# Patient Record
Sex: Female | Born: 1960
Health system: Southern US, Community
[De-identification: ages and names within clinical notes are randomized; demographics above are authoritative.]

## PROBLEM LIST (undated history)

## (undated) DIAGNOSIS — R0789 Other chest pain: Secondary | ICD-10-CM

## (undated) DIAGNOSIS — K219 Gastro-esophageal reflux disease without esophagitis: Secondary | ICD-10-CM

## (undated) DIAGNOSIS — I10 Essential (primary) hypertension: Secondary | ICD-10-CM

## (undated) DIAGNOSIS — M1990 Unspecified osteoarthritis, unspecified site: Secondary | ICD-10-CM

## (undated) DIAGNOSIS — G4700 Insomnia, unspecified: Secondary | ICD-10-CM

## (undated) DIAGNOSIS — F419 Anxiety disorder, unspecified: Secondary | ICD-10-CM

## (undated) DIAGNOSIS — E669 Obesity, unspecified: Secondary | ICD-10-CM

## (undated) DIAGNOSIS — D1803 Hemangioma of intra-abdominal structures: Secondary | ICD-10-CM

## (undated) DIAGNOSIS — I499 Cardiac arrhythmia, unspecified: Secondary | ICD-10-CM

## (undated) DIAGNOSIS — J45909 Unspecified asthma, uncomplicated: Secondary | ICD-10-CM

## (undated) DIAGNOSIS — R7303 Prediabetes: Secondary | ICD-10-CM

## (undated) DIAGNOSIS — E785 Hyperlipidemia, unspecified: Secondary | ICD-10-CM

## (undated) HISTORY — DX: Hyperlipidemia, unspecified: E78.5

## (undated) HISTORY — DX: Gastro-esophageal reflux disease without esophagitis: K21.9

## (undated) HISTORY — DX: Anxiety disorder, unspecified: F41.9

## (undated) HISTORY — PX: COLONOSCOPY: SHX174

## (undated) HISTORY — DX: Obesity, unspecified: E66.9

## (undated) HISTORY — DX: Essential (primary) hypertension: I10

## (undated) HISTORY — DX: Hemangioma of intra-abdominal structures: D18.03

## (undated) HISTORY — DX: Insomnia, unspecified: G47.00

## (undated) HISTORY — PX: OTHER SURGICAL HISTORY: SHX169

## (undated) HISTORY — DX: Other chest pain: R07.89

## (undated) HISTORY — DX: Unspecified asthma, uncomplicated: J45.909

---

## 1978-01-08 HISTORY — PX: OTHER SURGICAL HISTORY: SHX169

## 1998-08-09 ENCOUNTER — Encounter: Payer: Self-pay | Admitting: Orthopedic Surgery

## 1998-08-09 ENCOUNTER — Ambulatory Visit (HOSPITAL_COMMUNITY): Admission: RE | Admit: 1998-08-09 | Discharge: 1998-08-09 | Payer: Self-pay | Admitting: Orthopedic Surgery

## 1998-08-23 ENCOUNTER — Encounter: Payer: Self-pay | Admitting: Orthopedic Surgery

## 1998-08-23 ENCOUNTER — Ambulatory Visit (HOSPITAL_COMMUNITY): Admission: RE | Admit: 1998-08-23 | Discharge: 1998-08-23 | Payer: Self-pay | Admitting: Orthopedic Surgery

## 1998-09-06 ENCOUNTER — Ambulatory Visit (HOSPITAL_COMMUNITY): Admission: RE | Admit: 1998-09-06 | Discharge: 1998-09-06 | Payer: Self-pay | Admitting: Orthopedic Surgery

## 1998-09-06 ENCOUNTER — Encounter: Payer: Self-pay | Admitting: Orthopedic Surgery

## 1998-09-10 ENCOUNTER — Emergency Department (HOSPITAL_COMMUNITY): Admission: EM | Admit: 1998-09-10 | Discharge: 1998-09-10 | Payer: Self-pay | Admitting: Emergency Medicine

## 1998-09-10 ENCOUNTER — Encounter: Payer: Self-pay | Admitting: Emergency Medicine

## 1998-11-28 ENCOUNTER — Other Ambulatory Visit: Admission: RE | Admit: 1998-11-28 | Discharge: 1998-11-28 | Payer: Self-pay | Admitting: Obstetrics and Gynecology

## 1999-02-16 ENCOUNTER — Ambulatory Visit (HOSPITAL_BASED_OUTPATIENT_CLINIC_OR_DEPARTMENT_OTHER): Admission: RE | Admit: 1999-02-16 | Discharge: 1999-02-16 | Payer: Self-pay | Admitting: Plastic Surgery

## 1999-03-30 ENCOUNTER — Encounter (INDEPENDENT_AMBULATORY_CARE_PROVIDER_SITE_OTHER): Payer: Self-pay

## 1999-03-30 ENCOUNTER — Other Ambulatory Visit: Admission: RE | Admit: 1999-03-30 | Discharge: 1999-03-30 | Payer: Self-pay | Admitting: Obstetrics and Gynecology

## 1999-11-27 ENCOUNTER — Ambulatory Visit (HOSPITAL_COMMUNITY): Admission: RE | Admit: 1999-11-27 | Discharge: 1999-11-27 | Payer: Self-pay | Admitting: Obstetrics and Gynecology

## 1999-11-27 ENCOUNTER — Encounter: Payer: Self-pay | Admitting: Obstetrics and Gynecology

## 1999-12-07 ENCOUNTER — Other Ambulatory Visit: Admission: RE | Admit: 1999-12-07 | Discharge: 1999-12-07 | Payer: Self-pay | Admitting: Obstetrics and Gynecology

## 1999-12-13 ENCOUNTER — Encounter: Admission: RE | Admit: 1999-12-13 | Discharge: 1999-12-13 | Payer: Self-pay | Admitting: Obstetrics and Gynecology

## 1999-12-13 ENCOUNTER — Encounter: Payer: Self-pay | Admitting: Obstetrics and Gynecology

## 2000-05-03 ENCOUNTER — Ambulatory Visit (HOSPITAL_COMMUNITY): Admission: RE | Admit: 2000-05-03 | Discharge: 2000-05-03 | Payer: Self-pay | Admitting: Obstetrics and Gynecology

## 2000-05-03 ENCOUNTER — Encounter: Payer: Self-pay | Admitting: Obstetrics and Gynecology

## 2000-05-29 ENCOUNTER — Encounter: Admission: RE | Admit: 2000-05-29 | Discharge: 2000-05-29 | Payer: Self-pay | Admitting: Obstetrics and Gynecology

## 2000-05-29 ENCOUNTER — Encounter: Payer: Self-pay | Admitting: Obstetrics and Gynecology

## 2001-05-29 ENCOUNTER — Other Ambulatory Visit: Admission: RE | Admit: 2001-05-29 | Discharge: 2001-05-29 | Payer: Self-pay | Admitting: Obstetrics and Gynecology

## 2001-06-04 ENCOUNTER — Encounter: Payer: Self-pay | Admitting: Obstetrics and Gynecology

## 2001-06-04 ENCOUNTER — Encounter: Admission: RE | Admit: 2001-06-04 | Discharge: 2001-06-04 | Payer: Self-pay | Admitting: Obstetrics and Gynecology

## 2002-05-05 ENCOUNTER — Inpatient Hospital Stay (HOSPITAL_COMMUNITY): Admission: AD | Admit: 2002-05-05 | Discharge: 2002-05-06 | Payer: Self-pay | Admitting: *Deleted

## 2002-05-06 ENCOUNTER — Encounter: Payer: Self-pay | Admitting: Pulmonary Disease

## 2002-05-06 ENCOUNTER — Encounter: Payer: Self-pay | Admitting: Internal Medicine

## 2002-06-30 ENCOUNTER — Ambulatory Visit (HOSPITAL_COMMUNITY): Admission: RE | Admit: 2002-06-30 | Discharge: 2002-06-30 | Payer: Self-pay | Admitting: Obstetrics and Gynecology

## 2002-06-30 ENCOUNTER — Encounter: Payer: Self-pay | Admitting: Obstetrics and Gynecology

## 2002-07-10 ENCOUNTER — Encounter: Payer: Self-pay | Admitting: Obstetrics and Gynecology

## 2002-07-10 ENCOUNTER — Encounter: Admission: RE | Admit: 2002-07-10 | Discharge: 2002-07-10 | Payer: Self-pay | Admitting: Obstetrics and Gynecology

## 2002-09-03 ENCOUNTER — Encounter: Payer: Self-pay | Admitting: Pulmonary Disease

## 2002-09-03 ENCOUNTER — Ambulatory Visit (HOSPITAL_COMMUNITY): Admission: RE | Admit: 2002-09-03 | Discharge: 2002-09-03 | Payer: Self-pay | Admitting: Adult Health

## 2002-10-02 ENCOUNTER — Other Ambulatory Visit: Admission: RE | Admit: 2002-10-02 | Discharge: 2002-10-02 | Payer: Self-pay | Admitting: Obstetrics and Gynecology

## 2002-10-09 ENCOUNTER — Ambulatory Visit (HOSPITAL_COMMUNITY): Admission: RE | Admit: 2002-10-09 | Discharge: 2002-10-09 | Payer: Self-pay | Admitting: Internal Medicine

## 2003-04-03 ENCOUNTER — Emergency Department (HOSPITAL_COMMUNITY): Admission: EM | Admit: 2003-04-03 | Discharge: 2003-04-03 | Payer: Self-pay | Admitting: Emergency Medicine

## 2003-08-12 ENCOUNTER — Encounter: Admission: RE | Admit: 2003-08-12 | Discharge: 2003-08-12 | Payer: Self-pay | Admitting: Obstetrics and Gynecology

## 2004-01-09 HISTORY — PX: ABDOMINAL HYSTERECTOMY: SHX81

## 2004-01-20 ENCOUNTER — Other Ambulatory Visit: Admission: RE | Admit: 2004-01-20 | Discharge: 2004-01-20 | Payer: Self-pay | Admitting: Obstetrics and Gynecology

## 2004-02-03 ENCOUNTER — Ambulatory Visit: Payer: Self-pay | Admitting: Pulmonary Disease

## 2004-02-28 ENCOUNTER — Ambulatory Visit: Payer: Self-pay | Admitting: Pulmonary Disease

## 2004-04-19 ENCOUNTER — Ambulatory Visit: Payer: Self-pay

## 2004-04-19 ENCOUNTER — Observation Stay (HOSPITAL_COMMUNITY): Admission: AD | Admit: 2004-04-19 | Discharge: 2004-04-20 | Payer: Self-pay | Admitting: Obstetrics and Gynecology

## 2004-05-16 ENCOUNTER — Inpatient Hospital Stay (HOSPITAL_COMMUNITY): Admission: RE | Admit: 2004-05-16 | Discharge: 2004-05-19 | Payer: Self-pay | Admitting: Obstetrics and Gynecology

## 2004-05-16 ENCOUNTER — Encounter (INDEPENDENT_AMBULATORY_CARE_PROVIDER_SITE_OTHER): Payer: Self-pay | Admitting: Specialist

## 2004-08-09 ENCOUNTER — Ambulatory Visit: Payer: Self-pay

## 2004-10-17 ENCOUNTER — Ambulatory Visit (HOSPITAL_COMMUNITY): Admission: RE | Admit: 2004-10-17 | Discharge: 2004-10-17 | Payer: Self-pay | Admitting: Obstetrics and Gynecology

## 2005-01-08 HISTORY — PX: OTHER SURGICAL HISTORY: SHX169

## 2005-02-16 ENCOUNTER — Other Ambulatory Visit: Admission: RE | Admit: 2005-02-16 | Discharge: 2005-02-16 | Payer: Self-pay | Admitting: Obstetrics and Gynecology

## 2005-04-03 ENCOUNTER — Ambulatory Visit (HOSPITAL_COMMUNITY): Admission: RE | Admit: 2005-04-03 | Discharge: 2005-04-03 | Payer: Self-pay | Admitting: Pulmonary Disease

## 2005-04-03 ENCOUNTER — Ambulatory Visit: Payer: Self-pay | Admitting: Pulmonary Disease

## 2005-04-11 ENCOUNTER — Ambulatory Visit: Payer: Self-pay | Admitting: Pulmonary Disease

## 2005-10-25 ENCOUNTER — Ambulatory Visit (HOSPITAL_COMMUNITY): Admission: RE | Admit: 2005-10-25 | Discharge: 2005-10-25 | Payer: Self-pay | Admitting: Obstetrics and Gynecology

## 2005-11-17 ENCOUNTER — Emergency Department (HOSPITAL_COMMUNITY): Admission: EM | Admit: 2005-11-17 | Discharge: 2005-11-17 | Payer: Self-pay | Admitting: Emergency Medicine

## 2005-12-27 ENCOUNTER — Encounter: Payer: Self-pay | Admitting: Vascular Surgery

## 2005-12-27 ENCOUNTER — Ambulatory Visit (HOSPITAL_COMMUNITY): Admission: RE | Admit: 2005-12-27 | Discharge: 2005-12-27 | Payer: Self-pay | Admitting: Family Medicine

## 2006-02-07 ENCOUNTER — Ambulatory Visit: Payer: Self-pay | Admitting: Pulmonary Disease

## 2006-05-17 ENCOUNTER — Ambulatory Visit: Payer: Self-pay | Admitting: Pulmonary Disease

## 2006-05-20 ENCOUNTER — Ambulatory Visit: Payer: Self-pay | Admitting: Pulmonary Disease

## 2006-05-20 LAB — CONVERTED CEMR LAB
ALT: 21 units/L (ref 0–40)
AST: 19 units/L (ref 0–37)
Albumin: 4 g/dL (ref 3.5–5.2)
Alkaline Phosphatase: 72 units/L (ref 39–117)
BUN: 13 mg/dL (ref 6–23)
Basophils Absolute: 0.1 10*3/uL (ref 0.0–0.1)
Basophils Relative: 1.4 % — ABNORMAL HIGH (ref 0.0–1.0)
Bilirubin, Direct: 0.2 mg/dL (ref 0.0–0.3)
CO2: 29 meq/L (ref 19–32)
Calcium: 9.3 mg/dL (ref 8.4–10.5)
Chloride: 106 meq/L (ref 96–112)
Cholesterol: 156 mg/dL (ref 0–200)
Creatinine, Ser: 1 mg/dL (ref 0.4–1.2)
Eosinophils Absolute: 0.1 10*3/uL (ref 0.0–0.6)
Eosinophils Relative: 2 % (ref 0.0–5.0)
GFR calc Af Amer: 76 mL/min
GFR calc non Af Amer: 63 mL/min
Glucose, Bld: 98 mg/dL (ref 70–99)
HCT: 43.2 % (ref 36.0–46.0)
HDL: 41.8 mg/dL (ref >39.0)
Hemoglobin: 13.9 g/dL (ref 12.0–15.0)
LDL Cholesterol: 97 mg/dL (ref 0–99)
Lymphocytes Relative: 27.6 % (ref 12.0–46.0)
MCHC: 32.1 g/dL (ref 30.0–36.0)
MCV: 78.3 fL (ref 78.0–100.0)
Monocytes Absolute: 0.6 10*3/uL (ref 0.2–0.7)
Monocytes Relative: 9 % (ref 3.0–11.0)
Neutro Abs: 3.9 10*3/uL (ref 1.4–7.7)
Neutrophils Relative %: 60 % (ref 43.0–77.0)
Platelets: 371 10*3/uL (ref 150–400)
Potassium: 3.9 meq/L (ref 3.5–5.1)
RBC: 5.52 M/uL — ABNORMAL HIGH (ref 3.87–5.11)
RDW: 13 % (ref 11.5–14.6)
Sodium: 141 meq/L (ref 135–145)
TSH: 1.75 microintl units/mL (ref 0.35–5.50)
Total Bilirubin: 0.9 mg/dL (ref 0.3–1.2)
Total CHOL/HDL Ratio: 3.7
Total Protein: 7.1 g/dL (ref 6.0–8.3)
Triglycerides: 86 mg/dL (ref 0–149)
VLDL: 17 mg/dL (ref 0–40)
WBC: 6.5 10*3/uL (ref 4.5–10.5)

## 2006-05-22 ENCOUNTER — Ambulatory Visit: Payer: Self-pay | Admitting: Pulmonary Disease

## 2006-09-23 ENCOUNTER — Ambulatory Visit: Payer: Self-pay | Admitting: Pulmonary Disease

## 2006-10-28 ENCOUNTER — Ambulatory Visit (HOSPITAL_COMMUNITY): Admission: RE | Admit: 2006-10-28 | Discharge: 2006-10-28 | Payer: Self-pay | Admitting: Obstetrics and Gynecology

## 2007-02-13 ENCOUNTER — Encounter: Payer: Self-pay | Admitting: Pulmonary Disease

## 2007-02-13 ENCOUNTER — Ambulatory Visit: Payer: Self-pay | Admitting: Pulmonary Disease

## 2007-02-13 DIAGNOSIS — I10 Essential (primary) hypertension: Secondary | ICD-10-CM

## 2007-04-21 ENCOUNTER — Telehealth (INDEPENDENT_AMBULATORY_CARE_PROVIDER_SITE_OTHER): Payer: Self-pay | Admitting: *Deleted

## 2007-04-22 ENCOUNTER — Ambulatory Visit: Payer: Self-pay | Admitting: Pulmonary Disease

## 2007-05-06 IMAGING — CR DG ANKLE COMPLETE 3+V*L*
3 series · 3 of 3 positions shown · non-contrast
Comparison: None.

CLINICAL DATA: Left ankle pain.
 LEFT ANKLE ? 3 VIEW:

[view not recorded (1 of 3)]
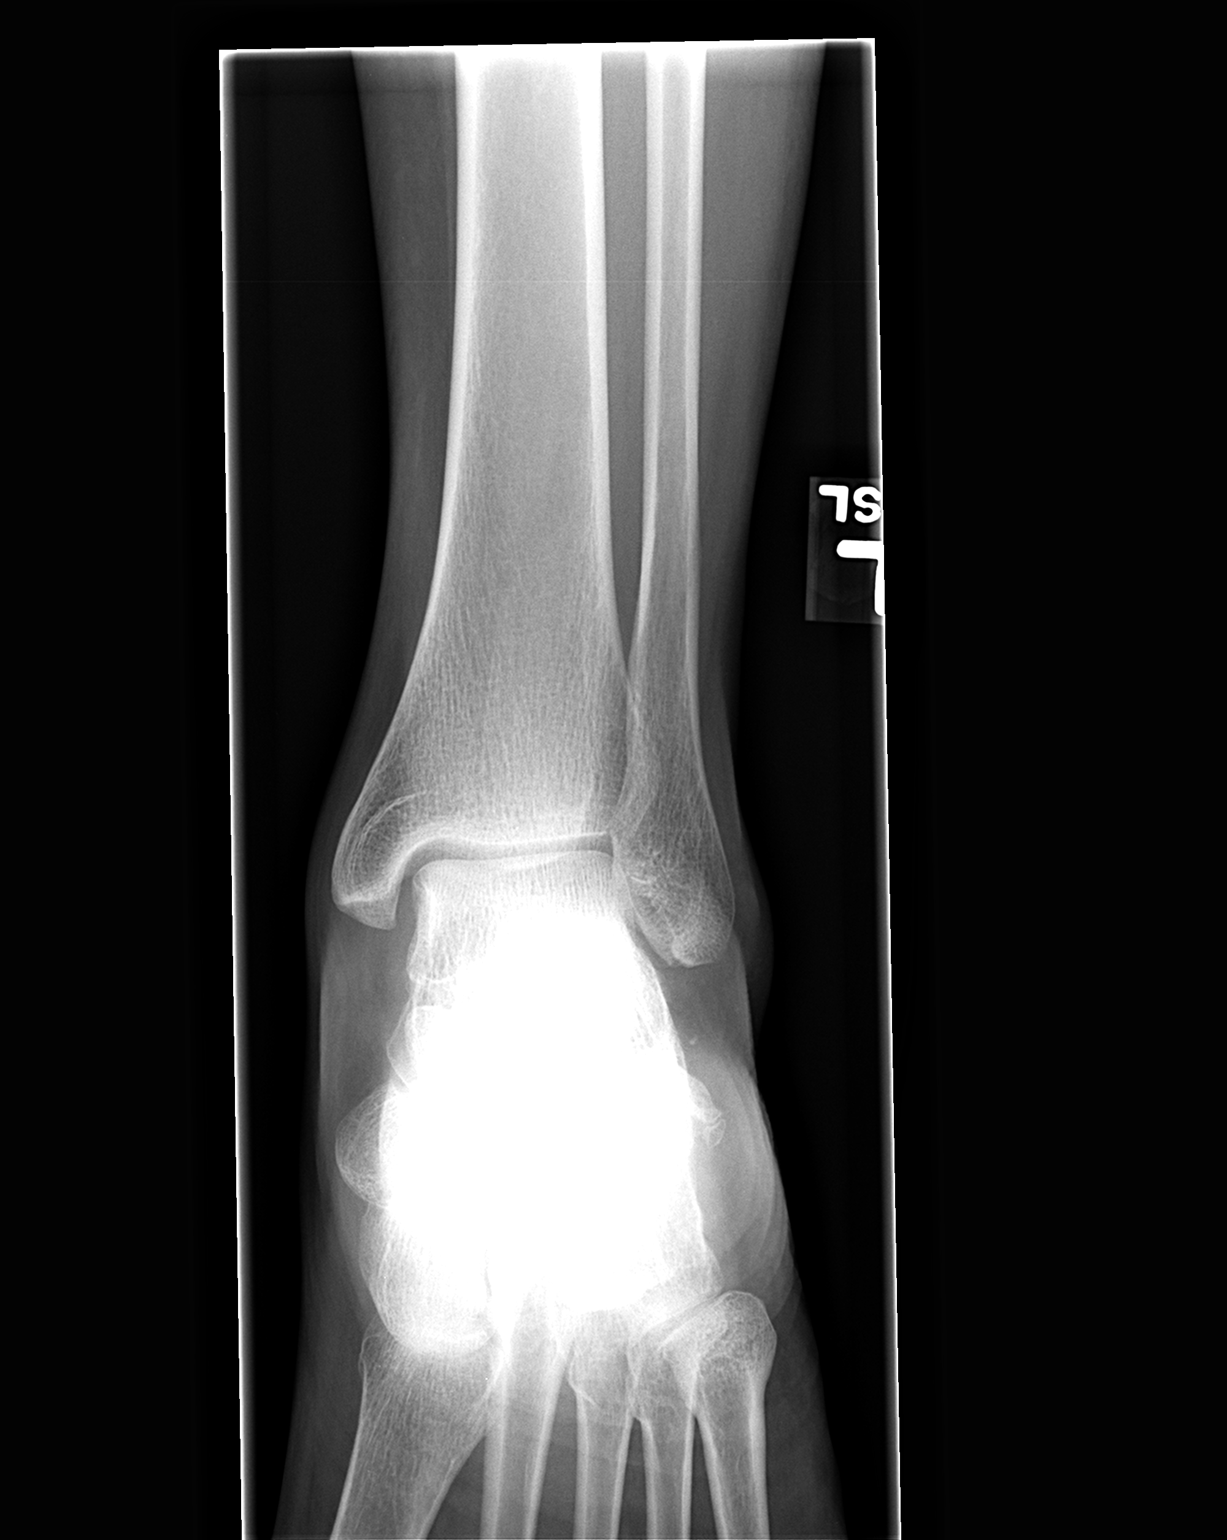

[view not recorded (2 of 3)]
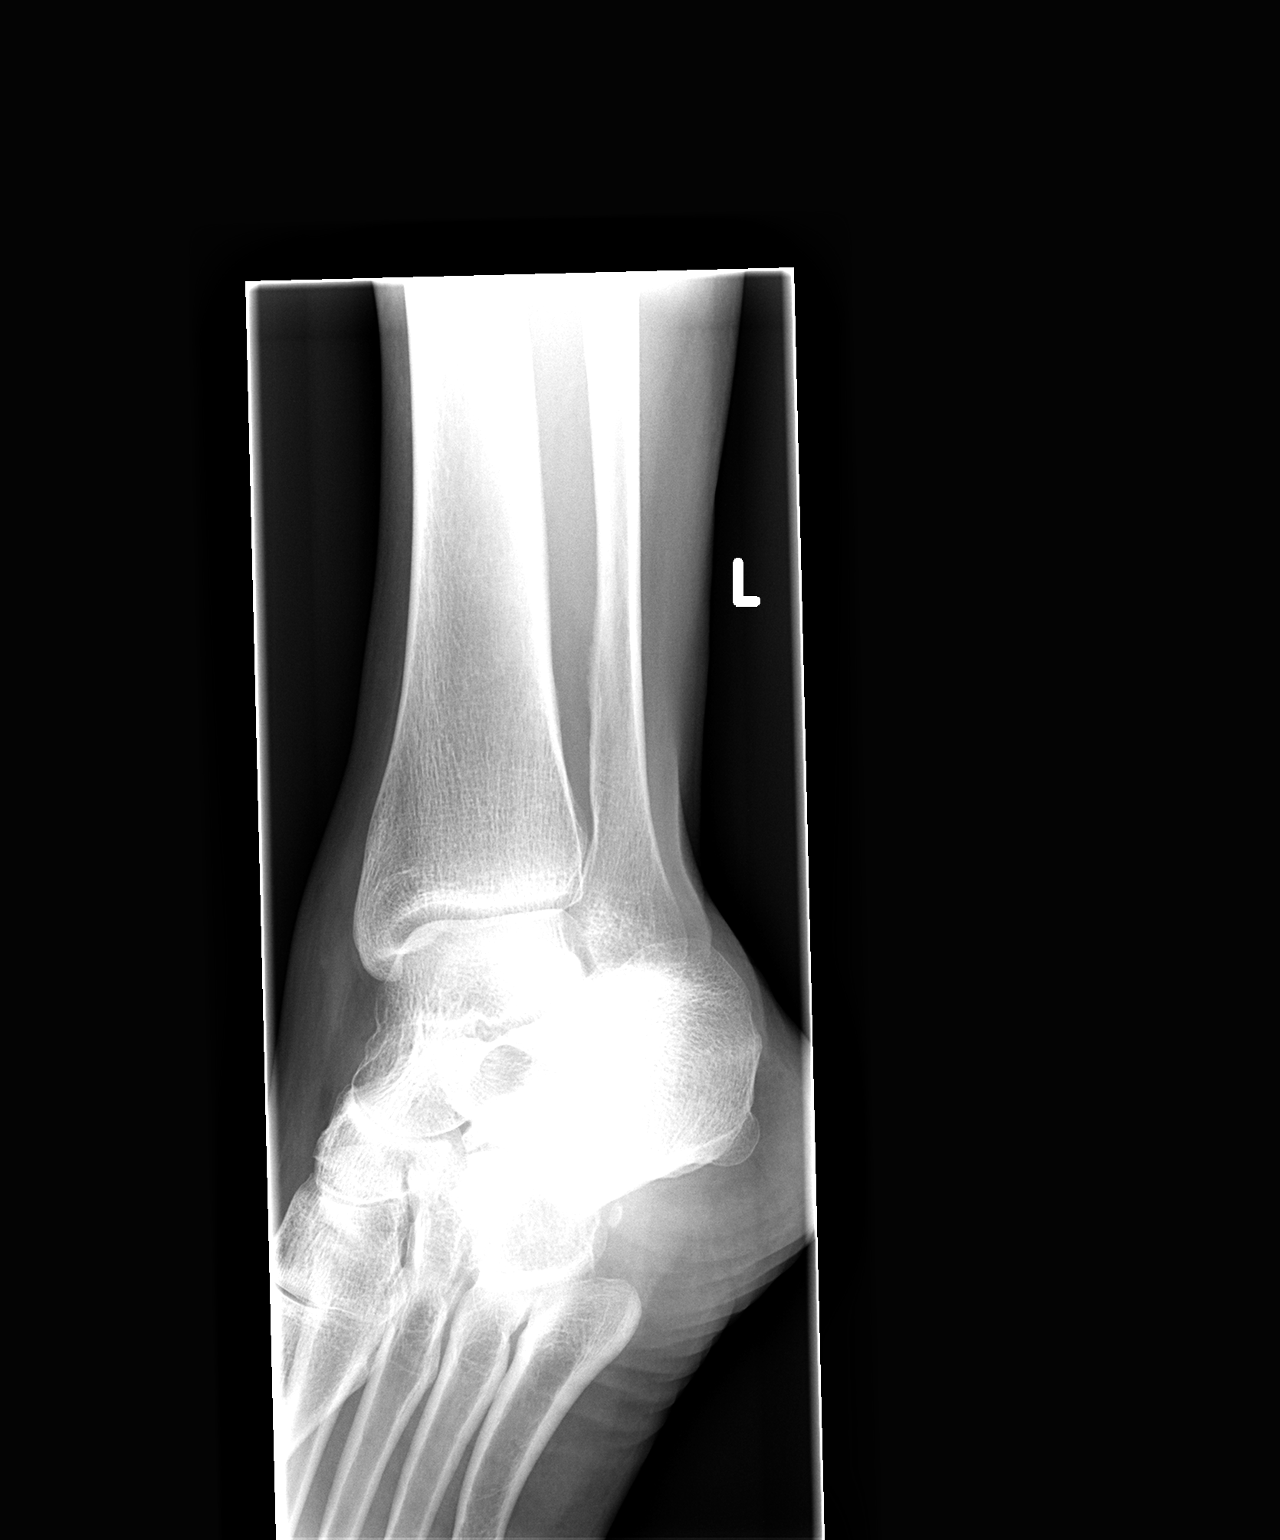

[view not recorded (3 of 3)]
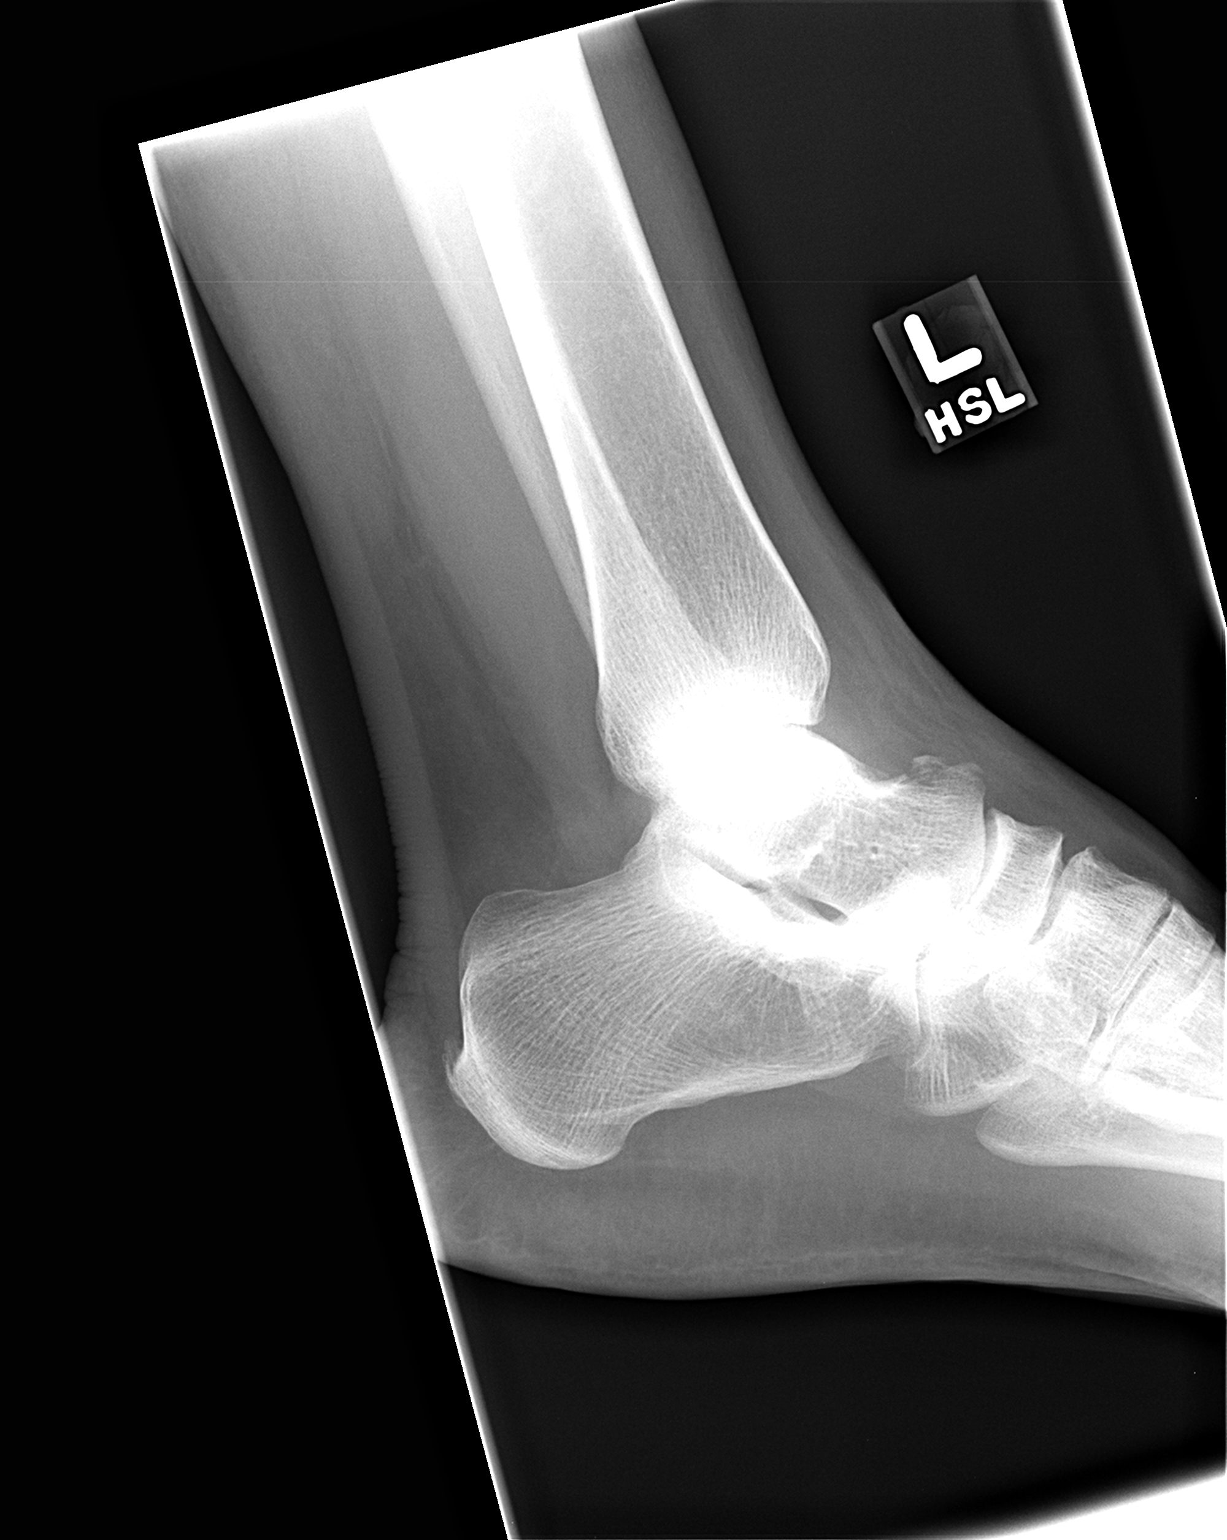

[3 of 3 positions shown; findings below may reference images not displayed]

FINDINGS: There is a fracture involving the dorsal surface of the talus.  
 No additional acute fractures or dislocations are identified.
IMPRESSION: Fracture involves the dorsal surface of the talus.

## 2007-08-27 ENCOUNTER — Encounter: Payer: Self-pay | Admitting: Pulmonary Disease

## 2007-10-06 ENCOUNTER — Encounter: Payer: Self-pay | Admitting: Pulmonary Disease

## 2007-10-27 ENCOUNTER — Encounter: Payer: Self-pay | Admitting: Pulmonary Disease

## 2007-12-03 ENCOUNTER — Ambulatory Visit: Payer: Self-pay | Admitting: Pulmonary Disease

## 2007-12-03 DIAGNOSIS — E785 Hyperlipidemia, unspecified: Secondary | ICD-10-CM

## 2007-12-03 DIAGNOSIS — K219 Gastro-esophageal reflux disease without esophagitis: Secondary | ICD-10-CM | POA: Insufficient documentation

## 2007-12-03 DIAGNOSIS — J209 Acute bronchitis, unspecified: Secondary | ICD-10-CM

## 2007-12-03 DIAGNOSIS — E669 Obesity, unspecified: Secondary | ICD-10-CM | POA: Insufficient documentation

## 2007-12-03 DIAGNOSIS — R0789 Other chest pain: Secondary | ICD-10-CM | POA: Insufficient documentation

## 2007-12-03 DIAGNOSIS — F411 Generalized anxiety disorder: Secondary | ICD-10-CM | POA: Insufficient documentation

## 2007-12-03 DIAGNOSIS — D1803 Hemangioma of intra-abdominal structures: Secondary | ICD-10-CM

## 2007-12-08 ENCOUNTER — Ambulatory Visit: Payer: Self-pay | Admitting: Pulmonary Disease

## 2007-12-08 ENCOUNTER — Ambulatory Visit: Payer: Self-pay | Admitting: Internal Medicine

## 2007-12-15 ENCOUNTER — Ambulatory Visit: Payer: Self-pay | Admitting: Internal Medicine

## 2007-12-15 ENCOUNTER — Encounter: Payer: Self-pay | Admitting: Internal Medicine

## 2007-12-15 ENCOUNTER — Ambulatory Visit (HOSPITAL_COMMUNITY): Admission: RE | Admit: 2007-12-15 | Discharge: 2007-12-15 | Payer: Self-pay | Admitting: Internal Medicine

## 2007-12-19 ENCOUNTER — Telehealth: Payer: Self-pay | Admitting: Pulmonary Disease

## 2008-01-07 ENCOUNTER — Ambulatory Visit (HOSPITAL_COMMUNITY): Admission: RE | Admit: 2008-01-07 | Discharge: 2008-01-07 | Payer: Self-pay | Admitting: Obstetrics and Gynecology

## 2008-01-17 ENCOUNTER — Encounter: Payer: Self-pay | Admitting: Pulmonary Disease

## 2008-07-07 ENCOUNTER — Telehealth (INDEPENDENT_AMBULATORY_CARE_PROVIDER_SITE_OTHER): Payer: Self-pay | Admitting: *Deleted

## 2008-07-23 ENCOUNTER — Telehealth: Payer: Self-pay | Admitting: Adult Health

## 2008-09-15 ENCOUNTER — Ambulatory Visit: Payer: Self-pay | Admitting: Internal Medicine

## 2008-09-15 ENCOUNTER — Encounter: Payer: Self-pay | Admitting: Adult Health

## 2008-09-15 ENCOUNTER — Telehealth: Payer: Self-pay | Admitting: Pulmonary Disease

## 2008-09-15 DIAGNOSIS — IMO0002 Reserved for concepts with insufficient information to code with codable children: Secondary | ICD-10-CM

## 2008-09-16 LAB — CONVERTED CEMR LAB
BUN: 13 mg/dL (ref 6–23)
Basophils Absolute: 0 10*3/uL (ref 0.0–0.1)
Basophils Relative: 0 % (ref 0.0–3.0)
CO2: 29 meq/L (ref 19–32)
Calcium: 9.4 mg/dL (ref 8.4–10.5)
Chloride: 105 meq/L (ref 96–112)
Creatinine, Ser: 0.9 mg/dL (ref 0.4–1.2)
Eosinophils Absolute: 0.2 10*3/uL (ref 0.0–0.7)
Eosinophils Relative: 1.4 % (ref 0.0–5.0)
GFR calc non Af Amer: 85.34 mL/min (ref >60)
Glucose, Bld: 91 mg/dL (ref 70–99)
HCT: 39.4 % (ref 36.0–46.0)
Hemoglobin: 13 g/dL (ref 12.0–15.0)
Hgb A1c MFr Bld: 5.9 % (ref 4.6–6.5)
Lymphocytes Relative: 14.3 % (ref 12.0–46.0)
Lymphs Abs: 1.6 10*3/uL (ref 0.7–4.0)
MCHC: 32.9 g/dL (ref 30.0–36.0)
MCV: 79.4 fL (ref 78.0–100.0)
Monocytes Absolute: 0.6 10*3/uL (ref 0.1–1.0)
Monocytes Relative: 5.4 % (ref 3.0–12.0)
Neutro Abs: 8.9 10*3/uL — ABNORMAL HIGH (ref 1.4–7.7)
Neutrophils Relative %: 78.9 % — ABNORMAL HIGH (ref 43.0–77.0)
Platelets: 326 10*3/uL (ref 150.0–400.0)
Potassium: 3.7 meq/L (ref 3.5–5.1)
RBC: 4.97 M/uL (ref 3.87–5.11)
RDW: 13.1 % (ref 11.5–14.6)
Sodium: 141 meq/L (ref 135–145)
WBC: 11.3 10*3/uL — ABNORMAL HIGH (ref 4.5–10.5)

## 2009-01-11 ENCOUNTER — Ambulatory Visit (HOSPITAL_COMMUNITY): Admission: RE | Admit: 2009-01-11 | Discharge: 2009-01-11 | Payer: Self-pay | Admitting: Obstetrics and Gynecology

## 2009-01-12 ENCOUNTER — Telehealth (INDEPENDENT_AMBULATORY_CARE_PROVIDER_SITE_OTHER): Payer: Self-pay | Admitting: *Deleted

## 2009-01-24 ENCOUNTER — Telehealth (INDEPENDENT_AMBULATORY_CARE_PROVIDER_SITE_OTHER): Payer: Self-pay | Admitting: *Deleted

## 2009-04-12 ENCOUNTER — Telehealth (INDEPENDENT_AMBULATORY_CARE_PROVIDER_SITE_OTHER): Payer: Self-pay | Admitting: *Deleted

## 2009-08-15 ENCOUNTER — Telehealth: Payer: Self-pay | Admitting: Pulmonary Disease

## 2009-08-19 ENCOUNTER — Ambulatory Visit: Payer: Self-pay | Admitting: Pulmonary Disease

## 2009-08-24 ENCOUNTER — Ambulatory Visit: Payer: Self-pay | Admitting: Pulmonary Disease

## 2009-08-28 DIAGNOSIS — G4700 Insomnia, unspecified: Secondary | ICD-10-CM

## 2009-08-28 LAB — CONVERTED CEMR LAB
ALT: 46 units/L — ABNORMAL HIGH (ref 0–35)
AST: 31 units/L (ref 0–37)
Albumin: 4.2 g/dL (ref 3.5–5.2)
Alkaline Phosphatase: 85 units/L (ref 39–117)
BUN: 16 mg/dL (ref 6–23)
Basophils Absolute: 0.1 10*3/uL (ref 0.0–0.1)
Basophils Relative: 1 % (ref 0.0–3.0)
Bilirubin Urine: NEGATIVE
Bilirubin, Direct: 0.1 mg/dL (ref 0.0–0.3)
CO2: 27 meq/L (ref 19–32)
Calcium: 9.8 mg/dL (ref 8.4–10.5)
Chloride: 109 meq/L (ref 96–112)
Cholesterol: 153 mg/dL (ref 0–200)
Creatinine, Ser: 0.7 mg/dL (ref 0.4–1.2)
Eosinophils Absolute: 0.1 10*3/uL (ref 0.0–0.7)
Eosinophils Relative: 2.1 % (ref 0.0–5.0)
GFR calc non Af Amer: 111.78 mL/min (ref >60)
Glucose, Bld: 91 mg/dL (ref 70–99)
HCT: 39.6 % (ref 36.0–46.0)
HDL: 50.3 mg/dL (ref >39.00)
Hemoglobin: 13.1 g/dL (ref 12.0–15.0)
Ketones, ur: NEGATIVE mg/dL
LDL Cholesterol: 94 mg/dL (ref 0–99)
Leukocytes, UA: NEGATIVE
Lymphocytes Relative: 34.2 % (ref 12.0–46.0)
Lymphs Abs: 1.9 10*3/uL (ref 0.7–4.0)
MCHC: 33.1 g/dL (ref 30.0–36.0)
MCV: 78.6 fL (ref 78.0–100.0)
Monocytes Absolute: 0.7 10*3/uL (ref 0.1–1.0)
Monocytes Relative: 12.6 % — ABNORMAL HIGH (ref 3.0–12.0)
Neutro Abs: 2.9 10*3/uL (ref 1.4–7.7)
Neutrophils Relative %: 50.1 % (ref 43.0–77.0)
Nitrite: NEGATIVE
Platelets: 307 10*3/uL (ref 150.0–400.0)
Potassium: 4.6 meq/L (ref 3.5–5.1)
RBC: 5.04 M/uL (ref 3.87–5.11)
RDW: 14.6 % (ref 11.5–14.6)
Sodium: 143 meq/L (ref 135–145)
Specific Gravity, Urine: 1.02 (ref 1.000–1.030)
TSH: 1.97 microintl units/mL (ref 0.35–5.50)
Total Bilirubin: 0.5 mg/dL (ref 0.3–1.2)
Total CHOL/HDL Ratio: 3
Total Protein, Urine: NEGATIVE mg/dL
Total Protein: 6.8 g/dL (ref 6.0–8.3)
Triglycerides: 44 mg/dL (ref 0.0–149.0)
Urine Glucose: NEGATIVE mg/dL
Urobilinogen, UA: 0.2 (ref 0.0–1.0)
VLDL: 8.8 mg/dL (ref 0.0–40.0)
WBC: 5.7 10*3/uL (ref 4.5–10.5)
pH: 5.5 (ref 5.0–8.0)

## 2009-09-06 ENCOUNTER — Telehealth: Payer: Self-pay | Admitting: Internal Medicine

## 2009-09-06 ENCOUNTER — Ambulatory Visit: Payer: Self-pay | Admitting: Internal Medicine

## 2009-09-07 LAB — CONVERTED CEMR LAB
ALT: 30 units/L (ref 0–35)
AST: 23 units/L (ref 0–37)
Albumin: 4.5 g/dL (ref 3.5–5.2)
Alkaline Phosphatase: 91 units/L (ref 39–117)
Amylase: 73 units/L (ref 27–131)
Bilirubin, Direct: 0.2 mg/dL (ref 0.0–0.3)
Lipase: 29 units/L (ref 11.0–59.0)
Total Bilirubin: 1.3 mg/dL — ABNORMAL HIGH (ref 0.3–1.2)
Total Protein: 7.1 g/dL (ref 6.0–8.3)

## 2009-09-08 HISTORY — PX: OTHER SURGICAL HISTORY: SHX169

## 2009-09-11 ENCOUNTER — Emergency Department (HOSPITAL_COMMUNITY): Admission: EM | Admit: 2009-09-11 | Discharge: 2009-09-11 | Payer: Self-pay | Admitting: Emergency Medicine

## 2009-09-11 ENCOUNTER — Telehealth: Payer: Self-pay | Admitting: Gastroenterology

## 2009-09-13 DIAGNOSIS — K829 Disease of gallbladder, unspecified: Secondary | ICD-10-CM | POA: Insufficient documentation

## 2009-09-14 ENCOUNTER — Encounter: Payer: Self-pay | Admitting: Internal Medicine

## 2009-09-20 ENCOUNTER — Ambulatory Visit (HOSPITAL_COMMUNITY): Admission: RE | Admit: 2009-09-20 | Discharge: 2009-09-20 | Payer: Self-pay | Admitting: General Surgery

## 2009-09-29 ENCOUNTER — Encounter: Payer: Self-pay | Admitting: Internal Medicine

## 2010-01-24 ENCOUNTER — Ambulatory Visit (HOSPITAL_COMMUNITY)
Admission: RE | Admit: 2010-01-24 | Discharge: 2010-01-24 | Payer: Self-pay | Source: Home / Self Care | Attending: Obstetrics and Gynecology | Admitting: Obstetrics and Gynecology

## 2010-01-28 ENCOUNTER — Encounter: Payer: Self-pay | Admitting: Obstetrics and Gynecology

## 2010-02-05 LAB — CONVERTED CEMR LAB
BUN: 19 mg/dL (ref 6–23)
CO2: 30 meq/L (ref 19–32)
Calcium: 9.5 mg/dL (ref 8.4–10.5)
Chloride: 103 meq/L (ref 96–112)
Creatinine, Ser: 0.8 mg/dL (ref 0.4–1.2)
GFR calc Af Amer: 98 mL/min
GFR calc non Af Amer: 81 mL/min
Glucose, Bld: 88 mg/dL (ref 70–99)
Potassium: 3.7 meq/L (ref 3.5–5.1)
Sodium: 140 meq/L (ref 135–145)
TSH: 1.44 microintl units/mL (ref 0.35–5.50)

## 2010-02-07 NOTE — Consult Note (Signed)
Summary: [facility]   Imported By: Sherian Rein 10/12/2009 15:13:46  _____________________________________________________________________  External Attachment:    Type:   Image     Comment:   External Document

## 2010-02-07 NOTE — Progress Notes (Signed)
Summary: waiting on refill  Phone Note Call from Patient   Caller: Patient Call For: nadel Summary of Call: m. cone out pt pharm told pt they had faxed for refill of atenolol. pt only has 3 remaining. needs asap. pt cell [phone] Initial call taken by: Tivis Ringer, CNA,  April 12, 2009 5:07 PM  Follow-up for Phone Call        rx sent to pharmacy.  pt aware.  Aundra Millet Reynolds LPN  April 13, 8411 5:11 PM     Prescriptions: ATENOLOL 50 MG  TABS (ATENOLOL) Take 1 tablet by mouth once a day  #90 x 3   Entered by:   Arman Filter LPN   Authorized by:   Michele Mcalpine MD   Signed by:   Arman Filter LPN on 24/40/1027   Method used:   Electronically to        [facility]* (retail)       [address]       [address] Shipping/mailing       [place], [place]  [address]       Ph: [phone]       Fax: [phone]   RxID:   [id number]

## 2010-02-07 NOTE — Progress Notes (Signed)
Summary: dyspepsia  Medications Added DEXILANT 60 MG CPDR (DEXLANSOPRAZOLE) 1 by mouth q AM 30 mins before breakfast       Phone Note Other Incoming   Summary of Call: Having nausea and heartburn. Tried Prilosec OTC once daily two times a day. Stays nauseaous Denies stressors. had some diarrhea over the wekend then became very nauseous after 2 bites of food no discrete pain but more like a belly ache abd is non-tender no fever to take Dexilant 60 mg once daily [id number] E exp 1/12 3 bottles also check lft's amylase and lipase Initial call taken by: Iva Boop MD, Clementeen Graham,  September 06, 2009 1:20 PM  New Problems: DYSPEPSIA (ICD-536.8)   New Problems: DYSPEPSIA (ICD-536.8) New/Updated Medications: DEXILANT 60 MG CPDR (DEXLANSOPRAZOLE) 1 by mouth q AM 30 mins before breakfast Prescriptions: DEXILANT 60 MG CPDR (DEXLANSOPRAZOLE) 1 by mouth q AM 30 mins before breakfast  #15 x 0   Entered and Authorized by:   Iva Boop MD, [facility]   Signed by:   Iva Boop MD, FACG on 09/06/2009   Method used:   Electronically to        [facility]  [address] [id number] (retail)       [address]       [place], [place]  [address]       Ph: [phone] or [phone]       Fax: [phone]   RxID:   [id number]

## 2010-02-07 NOTE — Progress Notes (Signed)
Summary: cough/ congestion  Phone Note Call from Patient   Caller: Patient Call For: nadel Summary of Call: pt have yellow congestion with cough would like something called to pharmacy [facility] [address] Initial call taken by: Rickard Patience,  January 24, 2009 8:11 AM  Follow-up for Phone Call        Spoke with pt, she c/o prod cough with yellow sputum and nasal congestion x 3 days.  States that she has taking otc cold/sinus med with no relief.  Would like rx called in.  Please advise thanks! NKDA Vernie Murders  January 24, 2009 8:23 AM   Additional Follow-up for Phone Call Additional follow up Details #1::        per SN---ok for pt to have zpak  #1  take as directed and mucinex max  1200mg   1 by mouth two times a day with plenty of fluids.  thanks Randell Loop CMA  January 24, 2009 9:55 AM     Additional Follow-up for Phone Call Additional follow up Details #2::    Spoke with pt and advised of SN's recs.  Rx for zpack was sent to pharm. Follow-up by: Vernie Murders,  January 24, 2009 10:00 AM  New/Updated Medications: ZITHROMAX Z-PAK 250 MG TABS (AZITHROMYCIN) take as directed Prescriptions: ZITHROMAX Z-PAK 250 MG TABS (AZITHROMYCIN) take as directed  #1 x 0   Entered by:   Vernie Murders   Authorized by:   Michele Mcalpine MD   Signed by:   Vernie Murders on 01/24/2009   Method used:   [facility]ronically [facility]        [facility]  [address] [id number]retail[facility]       [address]       [place], [place]  [address]       Ph: [phone] or [phone]       Fax: [phone]   RxID:   [id number]

## 2010-02-07 NOTE — Progress Notes (Signed)
Summary: labs  Phone Note Call from Patient Call back at [phone]   Caller: Patient Call For: nadel Reason for Call: Talk to Nurse Summary of Call: pt shcedulled to come in 08/24/2009.  Would like to come in sooner for her labs.  Can you put labs in and let her know.  [id number] Initial call taken by: Eugene Gavia,  August 15, 2009 11:47 AM  Follow-up for Phone Call        Pt saw TP last in Sept 2010 and was seen by SN last Nov 2009! Does she need labs done before her appt with SN this month? Pls advise thanks! Follow-up by: Vernie Murders,  August 15, 2009 11:53 AM  Additional Follow-up for Phone Call Additional follow up Details #1::        labs are in computer for 8-11---called 235 and left message for pt to make her aware Randell Loop [facility]  August 15, 2009 12:15 PM

## 2010-02-07 NOTE — Progress Notes (Signed)
Summary: Zyrtec RX  Phone Note Call from Patient Call back at ex [phone]   Caller: Patient Call For: nadel Summary of Call: would like prescript for zyrtek  [place] [facility] Initial call taken by: Rickard Patience,  January 12, 2009 8:12 AM  Follow-up for Phone Call        Patient can no longer get this OTC and needs RX sent to the pharmacy. Is this okay?Michel Bickers CMA  January 12, 2009 9:25 AM   ok for pt to have rx for zyrtec sent to the pharmacy. Randell Loop CMA  January 12, 2009 9:39 AM   Additional Follow-up for Phone Call Additional follow up Details #1::        Pt is awre of RX.Michel Bickers CMA  January 12, 2009 9:56 AM    New/Updated Medications: ZYRTEC ALLERGY 10 MG TABS (CETIRIZINE HCL) 1 by mouth daily Prescriptions: ZYRTEC ALLERGY 10 MG TABS (CETIRIZINE HCL) 1 by mouth daily  #30 x 6   Entered by:   Michel Bickers CMA   Authorized by:   Michele Mcalpine MD   Signed by:   Michel Bickers CMA on 01/12/2009   Method used:   Electronically to        [facility]* (retail)       [address]       [address] Shipping/mailing       [place], [place]  [address]       Ph: [phone]       Fax: [phone]   RxID:   [id number]

## 2010-02-07 NOTE — Letter (Signed)
Summary: [facility]   Imported By: Lester Belva 10/14/2009 09:35:19  _____________________________________________________________________  External Attachment:    Type:   Image     Comment:   External Document
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
## 2010-02-07 NOTE — Progress Notes (Signed)
   Phone Note Call from Patient   Caller: Patient Summary of Call: On call note at 0800. Pt had epigastric pain that lasted for several hours last night after eating a slice of pizza. Has ongoing, severe nausea with any food. Dexilant started several days ago which has not helped. Recent LFTs, lipase, amylase unremarkable except for t. bili=1.3. Advised to go to [facility] ED or [facility] urgent care to be evaluated and proceed with abd U/S to R/O cholelithiasis. Initial call taken by: Meryl Dare MD Clementeen Graham,  September 11, 2009 10:25 AM     Appended Document: Schedule Surgical referral Patient  was seen in the Urgent Care over teh weekend they recommended a surgical referral.  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

## 2010-03-23 LAB — DIFFERENTIAL
Basophils Absolute: 0 10*3/uL (ref 0.0–0.1)
Basophils Absolute: 0 10*3/uL (ref 0.0–0.1)
Eosinophils Absolute: 0.1 10*3/uL (ref 0.0–0.7)
Eosinophils Relative: 1 % (ref 0–5)
Lymphocytes Relative: 37 % (ref 12–46)
Monocytes Absolute: 0.5 10*3/uL (ref 0.1–1.0)
Monocytes Absolute: 0.7 10*3/uL (ref 0.1–1.0)
Neutro Abs: 2.9 10*3/uL (ref 1.7–7.7)
Neutrophils Relative %: 53 % (ref 43–77)

## 2010-03-23 LAB — CBC
HCT: 39.8 % (ref 36.0–46.0)
Hemoglobin: 13.2 g/dL (ref 12.0–15.0)
Hemoglobin: 14.8 g/dL (ref 12.0–15.0)
MCH: 26.2 pg (ref 26.0–34.0)
MCH: 25.8 pg — ABNORMAL LOW (ref 26.0–34.0)
MCHC: 33.2 g/dL (ref 30.0–36.0)
MCV: 79 fL (ref 78.0–100.0)
Platelets: 332 10*3/uL (ref 150–400)
Platelets: 389 10*3/uL (ref 150–400)
RBC: 5.04 MIL/uL (ref 3.87–5.11)
RBC: 5.73 MIL/uL — ABNORMAL HIGH (ref 3.87–5.11)
RDW: 13.9 % (ref 11.5–15.5)
WBC: 5.5 10*3/uL (ref 4.0–10.5)
WBC: 6.2 10*3/uL (ref 4.0–10.5)

## 2010-03-23 LAB — COMPREHENSIVE METABOLIC PANEL
ALT: 29 U/L (ref 0–35)
AST: 27 U/L (ref 0–37)
Albumin: 4.4 g/dL (ref 3.5–5.2)
Albumin: 4.5 g/dL (ref 3.5–5.2)
BUN: 13 mg/dL (ref 6–23)
CO2: 26 mEq/L (ref 19–32)
Chloride: 104 mEq/L (ref 96–112)
Chloride: 107 mEq/L (ref 96–112)
Creatinine, Ser: 1.01 mg/dL (ref 0.4–1.2)
GFR calc Af Amer: >60 mL/min (ref >60)
GFR calc non Af Amer: >60 mL/min (ref >60)
Potassium: 3.9 mEq/L (ref 3.5–5.1)
Sodium: 141 mEq/L (ref 135–145)
Total Bilirubin: 0.8 mg/dL (ref 0.3–1.2)
Total Bilirubin: 1.1 mg/dL (ref 0.3–1.2)
Total Protein: 7.2 g/dL (ref 6.0–8.3)

## 2010-03-23 LAB — LIPASE, BLOOD: Lipase: 32 U/L (ref 11–59)

## 2010-03-23 LAB — URINALYSIS, ROUTINE W REFLEX MICROSCOPIC
Bilirubin Urine: NEGATIVE
Nitrite: NEGATIVE
Specific Gravity, Urine: 1.01 (ref 1.005–1.030)
pH: 6.5 (ref 5.0–8.0)

## 2010-03-23 LAB — SURGICAL PCR SCREEN: Staphylococcus aureus: POSITIVE — AB

## 2010-04-10 ENCOUNTER — Telehealth: Payer: Self-pay | Admitting: Pulmonary Disease

## 2010-04-10 MED ORDER — ATENOLOL 50 MG PO TABS: ORAL_TABLET | ORAL | Status: DC

## 2010-04-10 NOTE — Telephone Encounter (Signed)
Called and spoke with pt and advised her rx was sent to [facility] pharmacy. She verbalized understanding

## 2010-05-23 NOTE — Assessment & Plan Note (Signed)
[facility]                             PULMONARY OFFICE NOTE   NAME:[name], [name]                     MRN:          [id number]  DATE:05/17/2006                            DOB:          1960/12/12    HISTORY OF PRESENT ILLNESS:  The patient is a 50 year old, African-  American female patient of Dr. Jodelle Green who has a known history of  asthmatic bronchitis, hyperlipidemia and hypertension who presents today  for a 2-day history of intermittent mid chest pain and pressure. The  patient complains that she has had intermittent episodes in which she  feels very achy all over with discomfort along her mid to left-sided  chest. She does have some intermittent nausea. She denies any associated  shortness of breath, exertional chest pain, abdominal pain, vomiting,  overt reflux symptoms, urinary symptoms, or fever. The patient has had  some arthritic complaints lately and had a previous ankle fracture and  has been taking Aleve and glucosamine. The patient has had a previous  cardiac workup with a cardiac catheterization in April 2004 by Dr.  Juanda Chance which showed normal coronary angiography and left ventricular  wall motion. The patient also had some recurrent atypical chest pains  and had a Cardiolite in August 2006 which was completely normal, no  evidence of ischemic or infarction with an ejection fraction of 82%.   PAST MEDICAL HISTORY:  Reviewed.   PHYSICAL EXAMINATION:  GENERAL:  The patient is a very pleasant, black  female in no acute distress.  VITAL SIGNS:  She is afebrile, blood pressure 120/70. O2 saturation  is  99% on room air.  HEENT:  Unremarkable.  NECK:  Supple without cervical adenopathy. No JVD.  LUNGS:  Lung sounds are clear to auscultation bilaterally. No wheezing  or crackles noted.  CARDIAC:  S1, S2 without murmurs, rubs or gallops.  ABDOMEN:  Soft without any hepatosplenomegaly appreciated. Bowel sounds  are positive throughout  all four quadrants. No guarding or rebound  noted. Negative CVA tenderness.  EXTREMITIES:  Warm without any calf tenderness, cyanosis, clubbing or  edema. Negative Homan's sign.   EKG done shows some nonspecific ST changes and poor R wave progression  but it is suspected more from lead placement. There are no acute designs  of ischemia. This EKG was discussed and reviewed with Dr. Sherene Sires.   IMPRESSION/PLAN:  1. Atypical chest pain. This patient has had an extensive cardiac      workup with a previous negative cardiac cath and Cardiolite. The      symptoms appear to be atypical for underlying cardiac disease. I      suggest that patient stop glucosamine and Aleve. This could be a      representative of underlying reflux which the patient has had in      the past. She has been off of her Protonix for several months. The      patient is to begin Nexium 40 mg twice daily over the next 5 days      and then resume daily along with reflux preventative measures. The  patient will recheck here early next week with Dr. Kriste Basque for      followup and fasting labs. The patient is advised if symptoms do      not improve or worsen she is to contact us immediately or go to the      closest emergency department.  2. Hyperlipidemia presently on Lipitor. The patient will return next      week as scheduled for fasting lipid panel.      Rubye Oaks, NP       Lonzo Cloud. Kriste Basque, MD    TP/MedQ  DD: 05/17/2006  DT: 05/18/2006  Job #: [id number]

## 2010-05-26 NOTE — Op Note (Signed)
[place]  Patient:    [name], [name]                     MRN: [id number] Proc. Date: 02/16/99 Adm. Date:  [id number] Attending:  Chapman Moss                           Operative Report  PREOPERATIVE DIAGNOSIS:  Multiple lesions face and neck undetermined behavior.  POSTOPERATIVE DIAGNOSIS:  Multiple lesions face and neck undetermined behavior.   PROCEDURE:  Excision multiple lesions of face, brow, upper eyelid, left temple,  lower eyelid and neck.  SURGEON:  Teena Irani. Odis Luster, M.D.  ANESTHESIA:  1% Xylocaine with epinephrine plus bicarbonate.  CLINICAL NOTE:  A 50 year old woman with multiple lesions that have enlarged and changed.  Some of which have developed some dark pigmentation and she would like to have them excised.  It is felt to be medically necessary.  Ashby Dawes of procedure nd risks, including scars, possibility of further surgery discussed and she wished to proceed.  DESCRIPTION OF PROCEDURE:  The patient was taken to the operating room and placed supine.  She was prepped with Betadine and draped with sterile drapes. Satisfactory local anesthesia 1% Xylocaine with epinephrine plus bicarbonate. Elliptical excision was performed with the relaxed skin tension lines and 6-0 Prolene simple interrupted sutures for closure after irrigating the wounds. She tolerated this well.  Antibiotic ointment was applied.  DISPOSITION:  Will see her back next week for recheck and removal of the sutures. She may shower starting tomorrow. DD:  02/16/99 TD:  02/17/99 Job: [id number]

## 2010-05-26 NOTE — H&P (Signed)
[name], [name]              ACCOUNT NO.:  [id number]   MEDICAL RECORD NO.:  [id number]          PATIENT TYPE:  INP   LOCATION:  NA                            [facility]   PHYSICIAN:  Huel Cote, M.D. DATE OF BIRTH:  Jun 15, 1960   DATE OF ADMISSION:  DATE OF DISCHARGE:                                HISTORY & PHYSICAL   DATE OF SURGERY:  May 16, 2004 at 10:30 a.m.   The patient is a 50 year old G3, P2-0-1-2- who is admitted several weeks ago  for heavy vaginal bleeding and indeed was brought in by EMS.  The bleeding  was so significant at work.  She has a prior history of menorrhagia which  was treated with NovaSure ablation surgery in January of 2005 with no  bleeding for approximately one year however, this episode of bleeding began  and was excessively heavy until the patient was placed on Aygestin and this  diminished her bleeding considerably.  Given her prior endometrial ablation,  the patient now wishes to proceed with definitive surgical therapy with a  hysterectomy.  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   PAST MEDICAL HISTORY:  Includes borderline chronic hypertension and  hypercholesterolemia.   PAST OBSTETRICAL HISTORY:  She has had two vaginal deliveries and one  ectopic pregnancy.   PAST GYNECOLOGIC HISTORY:  History of CIN-2 and a LEEP procedure performed  in March of 2006.   PAST SURGICAL HISTORY:  In January of 2005, she had a ThermaChoice  endometrial ablation.  In March of 2006, she had the LEEP procedure.  She  had a tubal ligation in 1989 of a remaining left fallopian tube.  In 1987,  she underwent a laparotomy with a resection of an ectopic pregnancy with a  right salpingectomy and in 1988, she had a missed abortion with a suction  dilatation and curettage.   CURRENT MEDICATIONS:  1.  Atenolol.  2.  Hydrochlorothiazide.  3.   Lipitor.   ALLERGIES:  SHE HAS NO KNOWN DRUG ALLERGIES.   PHYSICAL EXAMINATION:  VITAL SIGNS:  Temperature is 98.5, blood pressure  129/85, pulse is 95.  GENERAL:  She has no acute distress on her physical examination.  CARDIAC:  Exam is regular rate and rhythm.  LUNGS:  Clear.  ABDOMEN:  Soft and nontender.  PELVIC:  Her pelvic exam is significant for a long vagina with poor  descensus of the uterus.  Cervix also is quite short given her previous LEEP  procedure and somewhat friable.  Uterus is upper limits normal size.  Adnexa  have no palpable masses.   The patient was counseled closely to the risks and benefits of proceeding  with surgery including bleeding, infection, possible damage to bowel and  bladder.  She understands these risks but desires to proceed with the  surgery as stated.  She understands we will likely use her previous laparotomy scar and proceed  with removal of the uterus and cervix.  She wishes to retain  her ovaries,  most likely if they are normal to avoid a quick surgical menopause.  She  will remain on her Aygestin until the time of surgery to avoid any further  heavy bleeding.      KR/MEDQ  D:  05/15/2004  T:  05/15/2004  Job:  [id number]

## 2010-05-26 NOTE — Discharge Summary (Signed)
[name], [name]              ACCOUNT NO.:  [id number]   MEDICAL RECORD NO.:  [id number]          PATIENT TYPE:  INP   LOCATION:  9309                          [facility]ILITY[facility]  [facility]   PHYSICIAN:  Huel Cote, M.D. DATE OF BIRTH:  12-01-1960   DATE OF ADMISSION:  05/16/2004  DATE OF DISCHARGE:                                 DISCHARGE SUMMARY   DISCHARGE DIAGNOSES:  1.  Abnormal uterine bleeding.  2.  Failed NovaSure ablation.  3.  History of cervical dysplasia.  4.  Status post total abdominal hysterectomy   DISCHARGE MEDICATIONS:  1.  Motrin 600 mg p.o. q.6h. p.r.n.  2.  Percocet one to two tablets p.o. q.4h. p.r.n.  3.  Other medicines as taken preoperatively.   DISCHARGE FOLLOWUP:  The patient is to follow up in 2 weeks for incision  check, and again in 6 weeks for her full postoperative exam.   HOSPITAL COURSE:  The patient is a 50 year old G3, P2 who was admitted for a  scheduled abdominal hysterectomy given an ongoing problem with abnormal  uterine bleeding which began again after a NovaSure ablation approximately 1  year ago. At this time she requested definitive surgical therapy and  underwent a total abdominal hysterectomy without complication. She then was  admitted for routine postoperative care and did very well. On positive  postoperative day #1 her postoperative hemoglobin was 11.5 down from 12.8.  She was urinating well and doing well. She then stayed until postoperative  day #3, at which point she had normal diet and was having no nausea or  vomiting, passing good flatus, her pain was well controlled with p.o.  medications, and she was felt stable for discharge home.      KR/MEDQ  D:  05/19/2004  T:  05/19/2004  Job:  [id number]

## 2010-05-26 NOTE — Op Note (Signed)
NAMEJANAL, HAAK              ACCOUNT NO.:  [id number]   MEDICAL RECORD NO.:  [id number]          PATIENT TYPE:  INP   LOCATION:  9399                          [facility]  [facility]   PHYSICIAN:  Huel Cote, M.D. DATE OF BIRTH:  January 25, 1960   DATE OF PROCEDURE:  05/16/2004  DATE OF DISCHARGE:                                 OPERATIVE REPORT   PREOPERATIVE DIAGNOSES:  1.  Abnormal uterine bleeding.  2.  Failed Novasure ablation after one year.  3.  History of cervical dysplasia.   POSTOPERATIVE DIAGNOSES:  1.  Abnormal uterine bleeding.  2.  Failed Novasure ablation after one year.  3.  History of cervical dysplasia.   PROCEDURE:  Total abdominal hysterectomy.   SURGEONS:  1.  Huel Cote, M.D.  2.  Zenaida Niece, M.D.   ANESTHESIA:  General.   SPECIMENS:  Uterus and cervix.   ESTIMATED BLOOD LOSS:  100 cc.   URINE OUTPUT:  50 cc.   IV FLUIDS:  2,000 cc.   FINDINGS:  Uterus was upper limits of normal.  Adnexa were normal, with  tubes and ovaries inspected closely and found to have no abnormalities  except for some mild scarring of the right ovary to the uterine fundus.   PROCEDURE:  The patient was taken to the operating room, where general  anesthesia was obtained without difficulty.  She was then prepped and draped  in normal sterile fashion in the dorsal supine position.  With the Foley  catheter in place, a Pfannenstiel incision was made through a preexisting  scar and carried through the underlying layer of fascia by sharp dissection  and Bovie cautery.  The fascia was then nicked in the midline, and the  incision was extended laterally with Mayo scissors.  The inferior aspect of  the fascia was then grasped with Kocher clamps, elevated, and dissected off  the underlying rectus muscles.  In a similar fashion, the superior aspect  was elevated off the rectus muscles.  The rectus muscles were then separated  in the midline and the peritoneum entered  bluntly.  The peritoneal incision  was then extended carefully both by sharp dissection and Bovie cautery, with  careful attention to avoid both bowel and bladder.  The wound self-retaining  retractor was then placed within the incision and good exposure noted.  The  bowel was packed away with moist laparotomy sponges.  The uterus was then  grasped at each cornua with Kelly clamps and elevated to the level of the  incision.  Other than some adhesions of the right ovary to the uterine  fundus, there was no significant scarring or pathology noted.  The right and  left ovary appeared normal in caliber and size.  Attention was then turned  to the patient's round ligament on the left which was grasped with a mission  pickup and taken down with Bovie cautery.  The utero-ovarian ligament was  then isolated with a small hole made in the broad ligament, and this was  clamped with a parametrial clamp.  The utero-ovarian ligament was then  transected, and the pedicle  was secured with both a free tie and suture  ligature of 0 Vicryl.  The remaining broad ligament was then skeletonized  over the underlying uterine artery.  Attention was then turned to the  patient's right, where in similar fashion the round ligament was taken down  with Bovie cautery, a small hole made in the broad ligament, and then the  right ovary removed from the adhesions on the uterine fundus, both with  sharp dissection and Bovie cautery.  With the ovary successfully removed,  the utero-ovarian ligament was then clamped with a parametrial clamp,  transected, and the free pedicle secured with both a free tie and suture  ligature of 0 Vicryl.  The uterine artery was then also skeletonized, and  the bladder flap was created with Metzenbaum scissors and pushed away from  the cervix.  Two parametrial clamps were then placed on the uterine arteries  bilaterally, and these were transected and suture ligated with 0 Vicryl.  The bladder  flap was then further advanced, and the remaining paracervical  ligament was taken down with both straight and slightly curved parametrial  clamps.  Each step was secured with suture ligature of 0 Vicryl.  Upon  reaching the vaginal cuff, this was cross-clamped with slightly curved  Zeppelin clamps, and the cervix amputated in its entirety.  The vaginal cuff  was then secured with 0 Vicryl in several figure-of-eight sutures.  The  uterosacral ligaments were reapproximated to one another with a tie of two  previously held sutures.  Good support was noted.  The ureters were palpated  bilaterally and found to be normal in caliber and well away from the  surgical field.  The pelvis was then irrigated and no active bleeding noted.  Any small areas of oozing were easily controlled with Bovie cautery.  The  cuff appeared hemostatic.  Therefore, at this point, all laparotomy sponges  were removed from the abdomen, and the wound retractor also removed from the  abdomen.  The subfascial planes of the rectus muscle were checked for  bleeding and appeared to be hemostatic.  Therefore, the fascia was closed  with 0 Vicryl in a running fashion.  The subcutaneous tissue was  reapproximated with 0 chromic in a running fashion, and the skin was closed  with staples.  Sponge, lap, and needle counts were correct x2, and the  patient was taken to the recovery room in stable condition.      KR/MEDQ  D:  05/16/2004  T:  05/16/2004  Job:  [id number]

## 2010-05-26 NOTE — Cardiovascular Report (Signed)
NAME:  [name], [name]                        ACCOUNT NO.:  [id number]   MEDICAL RECORD NO.:  [id number]                   PATIENT TYPE:  INP   LOCATION:  6531                                 FACILITY:  [facility]   PHYSICIAN:  Charlies Constable, M.D.                  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  She has been having chest pain for several days, and it became  worse yesterday at work.  She saw Dr. Kriste Basque, and he arranged for her to come  to be admitted to [facility], and she was seen in consultation by Dr.  Berton Mount and scheduled for catheterization.  She has a strong positive  family history for vascular disease, and has a very elevated LDL.   DESCRIPTION OF PROCEDURE:  The procedure was performed via the right femoral  artery using an arterial sheath and 6-French preformed coronary catheters.  A front wall arterial puncture was performed, and Omnipaque contrast was  used.  A distal aortogram was performed to rule out renovascular causes for  hypertension.  The patient tolerated the procedure well and left the  laboratory in satisfactory condition.   PRESSURES:  1. The aortic pressure was 122/82 with a mean of 101.  2. Left ventricular pressure was 122/15.   RESULTS:  1. Left main:  The left main coronary artery was free of significant     disease.  2. Left anterior descending artery:  The left anterior descending artery     gave rise to a large septal perforator, a diagonal branch, and 3 more     septal perforators, and 2 small diagonal branches.  These and the LAD     proper were free of significant disease.  3. Left circumflex artery:   The left circumflex artery gave rise to a     marginal branch and a posterolateral branch.  These vessels were free of     significant disease.  4. Right coronary artery:  The right coronary artery was a moderate-sized     vessel that gave rise to a right ventricular branch, a posterior     descending branch, and 2 small posterolateral branches.  These vessels     were free of significant disease.  5. Left ventriculogram:  The left ventriculogram, performed in the RAO     projection, showed good wall motion with no areas of hypokinesis.  The     estimated ejection fraction was 60%.  6. Distal aortogram:  A distal aortogram was performed which showed patent     renal  arteries and no significant aortoiliac obstruction.   CONCLUSIONS:  Normal coronary angiography and left ventricular wall motion.    RECOMMENDATIONS:  Reassurance.  In view of these findings, her symptoms are  not likely cardiac.  Will plan to give her a trial of proton pump inhibitor  and discharge her today with followup with Dr. Kriste Basque.                                               Charlies Constable, M.D.    BB/MEDQ  D:  05/06/2002  T:  05/06/2002  Job:  [id number]   cc:   Lonzo Cloud. Kriste Basque, M.D. [facility]   Duke Salvia, M.D.   Cardiac Catheterization Lab

## 2010-05-26 NOTE — Discharge Summary (Signed)
NAME:  [name], [name]                        ACCOUNT NO.:  [id number]   MEDICAL RECORD NO.:  [id number]                   PATIENT TYPE:  INP   LOCATION:  6531                                 FACILITY:  [facility]   PHYSICIAN:  Duke Salvia, M.D.               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  Her  primary care physician is Dr. Costella Hatcher at [facility].  The  patient presented to Dr. Jodelle Green office on May 05, 2002, with chest  pressure just left to the mid sternum that began on May 04, 2002, while  walking.  The pressure left after several minutes.  The patient thought  nothing about the symptoms until it returned on May 05, 2002.  She also  noted nausea that was increasing.  She took Prilosec without relief of  symptoms.  Arrangements were made to admit the patient to [facility] for further evaluation.   PAST MEDICAL HISTORY:  1. Hypertension.  2. Elevated cholesterol levels.   ALLERGIES:  No known drug allergies.   MEDICATIONS:  Lipitor 5 mg daily.   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  She is married with two children.   HOSPITAL COURSE:  As noted, this patient was admitted to [facility]  by Dr. Kriste Basque on May 05, 2002, for evaluation of chest pain.  She was seen  in consultation by Dr. Graciela Husbands on May 05, 2002, who recommended aspirin and  beta-blocker as well as low molecular weight heparin and a heart  catheterization.   The patient underwent cardiac catheterization on May 06, 2002.  This was  found to be negative for coronary artery disease.  She had normal coronary  arteries.  The left ventricle was normal with  ejection fraction of 60%.  Arrangements were to be made to send the patient home, however, Dr. Graciela Husbands  wanted to check liver function tests, an amylase level and abdominal  ultrasound prior to discharge.  The liver function tests and amylase have  come back within normal limits.  The abdominal ultrasound has yet to be  performed.  This discharge summary is being prepared in advance.  The  patient could probably be discharged once the abdominal ultrasound has been  performed.   LABORATORY DATA AND X-RAY FINDINGS:  Amylase level 91.  Liver function tests  within normal limits.  Gallbladder ultrasound is pending.  Cholesterol 168,  triglycerides 67, HDL 58, LDL 97.  TSH 3.439.  INR 1.0.  CBC revealed  hemoglobin 12.7, hematocrit 37.9, WBC 6.2, platelets 342,000.  Chemistry  profile revealed BUN 8, creatinine 0.8, potassium 3.8, sodium  140, glucose  97.  Cardiac enzymes were negative.  The patient's potassium was initially  mildly low at 3.4.  As noted, the gallbladder ultrasound is pending.   DISCHARGE MEDICATIONS:  1. Protonix 40 mg daily.  2. Lipitor 5 mg daily.  3. Tylenol as needed for pain.   ACTIVITY:  The patient was told to avoid any strenuous activity or driving  for two days.  She was told not to lift more than 10 pounds x1 week.   DIET:  She was to be on a low fat, low salt diet.   SPECIAL INSTRUCTIONS:  She was told to call the [place] office if she had  any increased pain, swelling or bleeding from her groin.  She was to call  her primary care physician for a follow-up appointment.   FOLLOW UP:  She was to follow up with [facility] on Thursday, May  13, at noon.   DISCHARGE DIAGNOSES:  1. Chest pain with negative cardiac enzymes.  2. Cardiac catheterization performed on May 06, 2002, revealing normal     coronary arteries and normal left ventricle.  3. History of elevated lipids.  4. History of hypertension.  5. Positive family history of coronary artery  disease.  6. Gallbladder ultrasound pending.   DISPOSITION:  As noted, the patient could potentially be discharged once the  abdominal ultrasound has been performed.  She is stable from a cardiac  standpoint at this time.      Delton See, P.A. [facility]                  Duke Salvia, M.D.    DR/MEDQ  D:  05/06/2002  T:  05/06/2002  Job:  [id number]   cc:   [place], M.D.

## 2010-10-03 ENCOUNTER — Telehealth: Payer: Self-pay | Admitting: Pulmonary Disease

## 2010-10-03 DIAGNOSIS — Z0000 Encounter for general adult medical examination without abnormal findings: Secondary | ICD-10-CM

## 2010-10-03 NOTE — Telephone Encounter (Signed)
Pt has appt with SN for 10/04/10 for 1 yr rov- pls advise what labs SN wants order prior to seeing her, thanks!

## 2010-10-04 ENCOUNTER — Encounter: Payer: Self-pay | Admitting: Pulmonary Disease

## 2010-10-04 ENCOUNTER — Other Ambulatory Visit (INDEPENDENT_AMBULATORY_CARE_PROVIDER_SITE_OTHER): Payer: Self-pay

## 2010-10-04 ENCOUNTER — Ambulatory Visit (INDEPENDENT_AMBULATORY_CARE_PROVIDER_SITE_OTHER): Payer: 59 | Admitting: Pulmonary Disease

## 2010-10-04 DIAGNOSIS — D126 Benign neoplasm of colon, unspecified: Secondary | ICD-10-CM

## 2010-10-04 DIAGNOSIS — F411 Generalized anxiety disorder: Secondary | ICD-10-CM

## 2010-10-04 DIAGNOSIS — Z0000 Encounter for general adult medical examination without abnormal findings: Secondary | ICD-10-CM

## 2010-10-04 DIAGNOSIS — E663 Overweight: Secondary | ICD-10-CM

## 2010-10-04 DIAGNOSIS — K219 Gastro-esophageal reflux disease without esophagitis: Secondary | ICD-10-CM

## 2010-10-04 DIAGNOSIS — E669 Obesity, unspecified: Secondary | ICD-10-CM | POA: Insufficient documentation

## 2010-10-04 DIAGNOSIS — Z23 Encounter for immunization: Secondary | ICD-10-CM

## 2010-10-04 DIAGNOSIS — K829 Disease of gallbladder, unspecified: Secondary | ICD-10-CM

## 2010-10-04 DIAGNOSIS — I10 Essential (primary) hypertension: Secondary | ICD-10-CM

## 2010-10-04 DIAGNOSIS — E7800 Pure hypercholesterolemia, unspecified: Secondary | ICD-10-CM

## 2010-10-04 DIAGNOSIS — K635 Polyp of colon: Secondary | ICD-10-CM | POA: Insufficient documentation

## 2010-10-04 LAB — CBC WITH DIFFERENTIAL/PLATELET
Basophils Absolute: 0 10*3/uL (ref 0.0–0.1)
Eosinophils Absolute: 0.1 10*3/uL (ref 0.0–0.7)
HCT: 42.4 % (ref 36.0–46.0)
Hemoglobin: 13.9 g/dL (ref 12.0–15.0)
Lymphs Abs: 1.6 10*3/uL (ref 0.7–4.0)
MCHC: 32.8 g/dL (ref 30.0–36.0)
Monocytes Absolute: 0.6 10*3/uL (ref 0.1–1.0)
Neutro Abs: 3.2 10*3/uL (ref 1.4–7.7)
Platelets: 322 10*3/uL (ref 150.0–400.0)
RDW: 15 % — ABNORMAL HIGH (ref 11.5–14.6)

## 2010-10-04 LAB — URINALYSIS
Bilirubin Urine: NEGATIVE
Hgb urine dipstick: NEGATIVE
Ketones, ur: NEGATIVE
Leukocytes, UA: NEGATIVE
Nitrite: NEGATIVE
Specific Gravity, Urine: 1.015 (ref 1.000–1.030)
Total Protein, Urine: NEGATIVE
Urine Glucose: NEGATIVE
Urobilinogen, UA: 0.2 (ref 0.0–1.0)
pH: 6 (ref 5.0–8.0)

## 2010-10-04 LAB — LIPID PANEL
HDL: 60.2 mg/dL (ref >39.00)
Total CHOL/HDL Ratio: 2
VLDL: 7 mg/dL (ref 0.0–40.0)

## 2010-10-04 LAB — HEPATIC FUNCTION PANEL
AST: 30 U/L (ref 0–37)
Albumin: 4.3 g/dL (ref 3.5–5.2)
Alkaline Phosphatase: 88 U/L (ref 39–117)
Bilirubin, Direct: 0.1 mg/dL (ref 0.0–0.3)

## 2010-10-04 LAB — BASIC METABOLIC PANEL
CO2: 28 mEq/L (ref 19–32)
Calcium: 9.3 mg/dL (ref 8.4–10.5)
GFR: 81.49 mL/min (ref >60.00)
Glucose, Bld: 99 mg/dL (ref 70–99)
Potassium: 3.9 mEq/L (ref 3.5–5.1)
Sodium: 142 mEq/L (ref 135–145)

## 2010-10-04 LAB — TSH: TSH: 1.68 u[IU]/mL (ref 0.35–5.50)

## 2010-10-04 MED ORDER — ATENOLOL 50 MG PO TABS: ORAL_TABLET | ORAL | Status: DC

## 2010-10-04 MED ORDER — ATORVASTATIN CALCIUM 20 MG PO TABS: 20.0000 mg | ORAL_TABLET | Freq: Every day | ORAL | Status: DC

## 2010-10-04 MED ORDER — HYDROCHLOROTHIAZIDE 12.5 MG PO CAPS: 12.5000 mg | ORAL_CAPSULE | Freq: Every day | ORAL | Status: DC
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
## 2010-10-04 NOTE — Patient Instructions (Signed)
Today we updated your med list in EPIC...    We refilled your meds per request...  We reviewed your recent blood work & gave you a copy for your records...  Let's get on track w/ our diet 7 exercise program...    The goal is to lose 10-15 lbs or more...  Call for any problems.Marland KitchenMarland Kitchen

## 2010-12-18 ENCOUNTER — Telehealth: Payer: Self-pay | Admitting: Pulmonary Disease

## 2010-12-18 MED ORDER — AMOXICILLIN-POT CLAVULANATE 875-125 MG PO TABS: ORAL_TABLET | ORAL | Status: DC

## 2010-12-18 NOTE — Telephone Encounter (Signed)
RX has been sent for pt and is aware of SN recs.

## 2010-12-18 NOTE — Telephone Encounter (Signed)
I spoke with [name] and she c/o nasal congestion, cough w/ yellow phlem, facial pressure, sinus headache, and drainage down back of the throat x 2 weeks. [name] has been taking OTC mucinex and using saline nasal spray and has had no relief. [name] denies any fever, nausea, vomiting. [name] is requesting further recs from Dr. Kriste Basque. Please advise, thanks  No Known Allergies

## 2010-12-18 NOTE — Telephone Encounter (Signed)
Per SN---ok for augmentin 875mg    #20  1 po bid until gone. And use mucinex otc  2 po bid with plenty of fluids.  thanks

## 2011-01-04 ENCOUNTER — Other Ambulatory Visit (HOSPITAL_COMMUNITY): Payer: Self-pay | Admitting: Obstetrics and Gynecology

## 2011-01-04 DIAGNOSIS — Z1231 Encounter for screening mammogram for malignant neoplasm of breast: Secondary | ICD-10-CM

## 2011-02-06 ENCOUNTER — Ambulatory Visit (HOSPITAL_COMMUNITY)
Admission: RE | Admit: 2011-02-06 | Discharge: 2011-02-06 | Disposition: A | Discharge status: Home / Self Care | Payer: 59 | Source: Ambulatory Visit | Attending: Obstetrics and Gynecology | Admitting: Obstetrics and Gynecology

## 2011-02-06 DIAGNOSIS — Z1231 Encounter for screening mammogram for malignant neoplasm of breast: Secondary | ICD-10-CM | POA: Insufficient documentation

## 2011-07-26 ENCOUNTER — Other Ambulatory Visit: Payer: Self-pay

## 2011-07-26 MED ORDER — ESOMEPRAZOLE MAGNESIUM 40 MG PO CPDR: 40.0000 mg | DELAYED_RELEASE_CAPSULE | Freq: Every day | ORAL | Status: DC

## 2011-09-20 ENCOUNTER — Other Ambulatory Visit: Payer: Self-pay | Admitting: Pulmonary Disease

## 2011-10-09 ENCOUNTER — Other Ambulatory Visit: Payer: Self-pay | Admitting: Pulmonary Disease

## 2011-10-09 ENCOUNTER — Telehealth: Payer: Self-pay | Admitting: Pulmonary Disease

## 2011-10-09 DIAGNOSIS — Z0000 Encounter for general adult medical examination without abnormal findings: Secondary | ICD-10-CM

## 2011-10-09 NOTE — Telephone Encounter (Signed)
Attempted to call pt but she is at lunch.  Ok per SN to come in for lab work.  Orders have already been placed per pts request. Will try to call pt back later.

## 2011-10-09 NOTE — Telephone Encounter (Signed)
Spoke with pt. She is scheduled for cpx with SN 11/08/11 and wants to come in sooner for labs. She is having cramping in her calves and believes K may be low. Please advise, thanks!!

## 2011-10-10 NOTE — Telephone Encounter (Signed)
Called and spoke with pt and she is aware of labs in the computer.  Nothing further is needed.  

## 2011-10-11 ENCOUNTER — Other Ambulatory Visit (INDEPENDENT_AMBULATORY_CARE_PROVIDER_SITE_OTHER): Payer: 59

## 2011-10-11 DIAGNOSIS — Z0000 Encounter for general adult medical examination without abnormal findings: Secondary | ICD-10-CM

## 2011-10-11 LAB — LIPID PANEL
Cholesterol: 158 mg/dL (ref 0–200)
HDL: 51.8 mg/dL (ref >39.00)
LDL Cholesterol: 96 mg/dL (ref 0–99)
Total CHOL/HDL Ratio: 3
Triglycerides: 50 mg/dL (ref 0.0–149.0)
VLDL: 10 mg/dL (ref 0.0–40.0)

## 2011-10-11 LAB — CBC WITH DIFFERENTIAL/PLATELET
Basophils Relative: 0.8 % (ref 0.0–3.0)
Eosinophils Relative: 3.8 % (ref 0.0–5.0)
HCT: 40.5 % (ref 36.0–46.0)
Hemoglobin: 12.9 g/dL (ref 12.0–15.0)
MCHC: 32 g/dL (ref 30.0–36.0)
MCV: 78.5 fl (ref 78.0–100.0)
Monocytes Absolute: 0.5 10*3/uL (ref 0.1–1.0)
Neutro Abs: 2.4 10*3/uL (ref 1.4–7.7)
Neutrophils Relative %: 49.5 % (ref 43.0–77.0)
RBC: 5.16 Mil/uL — ABNORMAL HIGH (ref 3.87–5.11)
WBC: 4.8 10*3/uL (ref 4.5–10.5)

## 2011-10-11 LAB — HEPATIC FUNCTION PANEL
ALT: 33 U/L (ref 0–35)
Bilirubin, Direct: 0.2 mg/dL (ref 0.0–0.3)
Total Bilirubin: 1.1 mg/dL (ref 0.3–1.2)

## 2011-10-11 LAB — BASIC METABOLIC PANEL
BUN: 15 mg/dL (ref 6–23)
Calcium: 9 mg/dL (ref 8.4–10.5)
Creatinine, Ser: 0.9 mg/dL (ref 0.4–1.2)
GFR: 86.52 mL/min (ref >60.00)

## 2011-10-11 LAB — URINALYSIS, ROUTINE W REFLEX MICROSCOPIC
Bilirubin Urine: NEGATIVE
Nitrite: NEGATIVE
Total Protein, Urine: NEGATIVE
Urine Glucose: NEGATIVE
pH: 6.5 (ref 5.0–8.0)

## 2011-11-07 ENCOUNTER — Encounter: Payer: Self-pay | Admitting: *Deleted

## 2011-11-08 ENCOUNTER — Ambulatory Visit (INDEPENDENT_AMBULATORY_CARE_PROVIDER_SITE_OTHER): Payer: 59 | Admitting: Pulmonary Disease

## 2011-11-08 ENCOUNTER — Encounter: Payer: Self-pay | Admitting: Pulmonary Disease

## 2011-11-08 VITALS — BP 122/84 | HR 79 | Temp 97.4°F | Ht 69.0 in | Wt 216.0 lb

## 2011-11-08 DIAGNOSIS — Z0000 Encounter for general adult medical examination without abnormal findings: Secondary | ICD-10-CM

## 2011-11-08 DIAGNOSIS — E663 Overweight: Secondary | ICD-10-CM

## 2011-11-08 DIAGNOSIS — I10 Essential (primary) hypertension: Secondary | ICD-10-CM

## 2011-11-08 DIAGNOSIS — E7800 Pure hypercholesterolemia, unspecified: Secondary | ICD-10-CM

## 2011-11-08 MED ORDER — HYDROCHLOROTHIAZIDE 12.5 MG PO CAPS: 12.5000 mg | ORAL_CAPSULE | Freq: Every day | ORAL | Status: DC

## 2011-11-08 MED ORDER — ATORVASTATIN CALCIUM 20 MG PO TABS: 20.0000 mg | ORAL_TABLET | Freq: Every day | ORAL | Status: DC

## 2011-11-08 MED ORDER — ATENOLOL 50 MG PO TABS: 50.0000 mg | ORAL_TABLET | Freq: Every day | ORAL | Status: DC

## 2011-11-08 MED ORDER — ESOMEPRAZOLE MAGNESIUM 40 MG PO CPDR: 40.0000 mg | DELAYED_RELEASE_CAPSULE | Freq: Every day | ORAL | Status: DC

## 2011-11-08 NOTE — Patient Instructions (Addendum)
Today we updated your med list in our EPIC system...    Continue your current medications the same...    We refilled your meds per request...  We reviewed your recnet fasting blood work & gave you a copy...    Today we did your follow up CXR & EKG> we will notify you of these results when avail...  Keep up the good work w/ diet & exercise...    The goal is to lose 10-15 lbs...  Call for any questions.Marland KitchenMarland Kitchen
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
## 2011-11-09 ENCOUNTER — Ambulatory Visit (INDEPENDENT_AMBULATORY_CARE_PROVIDER_SITE_OTHER)
Admission: RE | Admit: 2011-11-09 | Discharge: 2011-11-09 | Disposition: A | Discharge status: Home / Self Care | Payer: 59 | Source: Ambulatory Visit | Attending: Pulmonary Disease | Admitting: Pulmonary Disease

## 2011-11-09 DIAGNOSIS — Z0000 Encounter for general adult medical examination without abnormal findings: Secondary | ICD-10-CM

## 2011-11-21 ENCOUNTER — Telehealth: Payer: Self-pay | Admitting: Pulmonary Disease

## 2011-11-21 NOTE — Telephone Encounter (Signed)
LM for pt to call back.

## 2011-11-21 NOTE — Telephone Encounter (Signed)
Per SN---this is ok to stop the hctz.  Monitor her BP at home.  thanks

## 2011-11-21 NOTE — Telephone Encounter (Signed)
Patient is trying a "vinegar diet" and is requesting to stop her HCTZ - her BP has been running low (was 110/62 this afternoon) and she has been working out 4x week.  Will continue the atenolol 50mg  qd.  Dr Kriste Basque please advise, thanks.  Last ov 10.31.13 for cpx w/ SN.

## 2011-11-22 NOTE — Telephone Encounter (Signed)
Patient aware of SN's recommendations.  Nothing further needed.

## 2012-01-10 ENCOUNTER — Other Ambulatory Visit: Payer: Self-pay

## 2012-01-10 MED ORDER — PANTOPRAZOLE SODIUM 40 MG PO TBEC: 40.0000 mg | DELAYED_RELEASE_TABLET | Freq: Every day | ORAL | Status: DC

## 2012-01-23 ENCOUNTER — Other Ambulatory Visit (HOSPITAL_COMMUNITY): Payer: Self-pay | Admitting: Obstetrics and Gynecology

## 2012-01-23 DIAGNOSIS — Z1231 Encounter for screening mammogram for malignant neoplasm of breast: Secondary | ICD-10-CM

## 2012-02-14 ENCOUNTER — Ambulatory Visit (HOSPITAL_COMMUNITY)
Admission: RE | Admit: 2012-02-14 | Discharge: 2012-02-14 | Disposition: A | Discharge status: Home / Self Care | Payer: 59 | Source: Ambulatory Visit | Attending: Obstetrics and Gynecology | Admitting: Obstetrics and Gynecology

## 2012-02-14 DIAGNOSIS — Z1231 Encounter for screening mammogram for malignant neoplasm of breast: Secondary | ICD-10-CM | POA: Insufficient documentation

## 2012-03-27 ENCOUNTER — Telehealth: Payer: Self-pay | Admitting: *Deleted

## 2012-03-27 NOTE — Telephone Encounter (Signed)
Per SN---  Glad the BP is doing so well.  Why would she want to cut back or stop?  Does she feel that she no longer has a chol problem?  lmom for the pt to call back.

## 2012-03-27 NOTE — Telephone Encounter (Signed)
Pt called back and she is aware of SN recs to stay on the chol meds.  Pt stated that she will stay on this med until her ov with sn in October and we can repeat her labs at that time. Nothing further is needed.

## 2012-03-27 NOTE — Telephone Encounter (Addendum)
Pt wanted to know if she will need to cont taking the chol meds until her next appt with SN that she will schedule.  Should she cut this back to a couple of days per week or once per week.  Pt stated that her BP at home has been great---108/68 and she gets this checked regularly at work.  Call pt back at extension [phone].   SN please advise.  Thanks  No Known Allergies

## 2012-10-08 ENCOUNTER — Telehealth: Payer: Self-pay | Admitting: *Deleted

## 2012-10-08 MED ORDER — METHYLPREDNISOLONE 4 MG PO KIT: PACK | ORAL | Status: DC

## 2012-10-08 NOTE — Telephone Encounter (Signed)
I spoke with pt. She is aware of recs. RX has been sent.

## 2012-10-08 NOTE — Telephone Encounter (Signed)
Per SN---  Medrol dosepak  #1  Take as directed with no refills. Call pt back at 235.  thanks

## 2012-10-08 NOTE — Telephone Encounter (Signed)
Called and spoke with pt and she stated that she has started with nasal congestion that is clear that started yesterday.  Denies any fever.  She has been using claritin and mucinex with no relief.  Pt also c/o scratchy throat.  Pt is scheduled for a cpx next month and did not want to be seen.  SN please advise of any recs.  Thanks  No Known Allergies

## 2012-10-27 ENCOUNTER — Telehealth: Payer: Self-pay | Admitting: Pulmonary Disease

## 2012-10-27 DIAGNOSIS — Z0000 Encounter for general adult medical examination without abnormal findings: Secondary | ICD-10-CM

## 2012-10-27 NOTE — Telephone Encounter (Signed)
Pt is aware. Nothing further needed 

## 2012-10-27 NOTE — Telephone Encounter (Signed)
Per SN--ok to come in for labs.  These have been placed.  Pt will not need cxr--was done at her last ov---unless she is smoking.  thanks

## 2012-10-27 NOTE — Telephone Encounter (Signed)
Pt has an appt 11/04/12 for CPX.  Last OV 11/07/11 Please advise SN thanks

## 2012-10-30 ENCOUNTER — Other Ambulatory Visit (INDEPENDENT_AMBULATORY_CARE_PROVIDER_SITE_OTHER): Payer: 59

## 2012-10-30 DIAGNOSIS — Z0000 Encounter for general adult medical examination without abnormal findings: Secondary | ICD-10-CM

## 2012-10-30 LAB — URINALYSIS
Bilirubin Urine: NEGATIVE
Ketones, ur: NEGATIVE
Nitrite: NEGATIVE
Total Protein, Urine: NEGATIVE
Urine Glucose: NEGATIVE
pH: 6 (ref 5.0–8.0)

## 2012-10-30 LAB — HEPATIC FUNCTION PANEL
ALT: 36 U/L — ABNORMAL HIGH (ref 0–35)
Alkaline Phosphatase: 83 U/L (ref 39–117)
Bilirubin, Direct: 0.1 mg/dL (ref 0.0–0.3)
Total Bilirubin: 0.8 mg/dL (ref 0.3–1.2)

## 2012-10-30 LAB — CBC WITH DIFFERENTIAL/PLATELET
Basophils Relative: 0.6 % (ref 0.0–3.0)
Eosinophils Absolute: 0.1 10*3/uL (ref 0.0–0.7)
Hemoglobin: 13.1 g/dL (ref 12.0–15.0)
Lymphs Abs: 1.5 10*3/uL (ref 0.7–4.0)
MCHC: 32.5 g/dL (ref 30.0–36.0)
MCV: 77.6 fl — ABNORMAL LOW (ref 78.0–100.0)
Monocytes Absolute: 0.6 10*3/uL (ref 0.1–1.0)
Neutro Abs: 3.6 10*3/uL (ref 1.4–7.7)
RBC: 5.21 Mil/uL — ABNORMAL HIGH (ref 3.87–5.11)

## 2012-10-30 LAB — BASIC METABOLIC PANEL
Calcium: 9.4 mg/dL (ref 8.4–10.5)
Chloride: 107 mEq/L (ref 96–112)
Creatinine, Ser: 0.8 mg/dL (ref 0.4–1.2)
Sodium: 142 mEq/L (ref 135–145)

## 2012-10-30 LAB — LIPID PANEL
LDL Cholesterol: 96 mg/dL (ref 0–99)
Total CHOL/HDL Ratio: 3
Triglycerides: 36 mg/dL (ref 0.0–149.0)

## 2012-11-04 ENCOUNTER — Encounter: Payer: Self-pay | Admitting: Pulmonary Disease

## 2012-11-04 ENCOUNTER — Ambulatory Visit (INDEPENDENT_AMBULATORY_CARE_PROVIDER_SITE_OTHER): Payer: 59 | Admitting: Pulmonary Disease

## 2012-11-04 VITALS — BP 126/80 | HR 73 | Temp 97.9°F | Ht 69.0 in | Wt 215.6 lb

## 2012-11-04 DIAGNOSIS — E7800 Pure hypercholesterolemia, unspecified: Secondary | ICD-10-CM

## 2012-11-04 DIAGNOSIS — K219 Gastro-esophageal reflux disease without esophagitis: Secondary | ICD-10-CM

## 2012-11-04 DIAGNOSIS — Z23 Encounter for immunization: Secondary | ICD-10-CM

## 2012-11-04 DIAGNOSIS — G4700 Insomnia, unspecified: Secondary | ICD-10-CM

## 2012-11-04 DIAGNOSIS — Z0000 Encounter for general adult medical examination without abnormal findings: Secondary | ICD-10-CM | POA: Insufficient documentation

## 2012-11-04 DIAGNOSIS — E663 Overweight: Secondary | ICD-10-CM

## 2012-11-04 DIAGNOSIS — I10 Essential (primary) hypertension: Secondary | ICD-10-CM

## 2012-11-04 MED ORDER — ATENOLOL 50 MG PO TABS: 50.0000 mg | ORAL_TABLET | Freq: Every day | ORAL | Status: DC

## 2012-11-04 MED ORDER — ATORVASTATIN CALCIUM 20 MG PO TABS: 20.0000 mg | ORAL_TABLET | Freq: Every day | ORAL | Status: DC
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
## 2012-11-04 NOTE — Patient Instructions (Signed)
Today we updated your med list in our EPIC system...    Continue your current medications the same...  We refilled your meds per request...  Today we gave you the combinatiopn Teanus shot called the TDAP (it is good for 10 yrs)...  Call for any questions...  Let's plan a follow up visit in 77yr, sooner if needed for problems.Marland KitchenMarland Kitchen

## 2013-01-12 ENCOUNTER — Telehealth: Payer: Self-pay | Admitting: Pulmonary Disease

## 2013-01-12 NOTE — Telephone Encounter (Signed)
Pt works in [employer].  She would like to switch from Dr. Lenna Gilford to Dr Asa Lente.  She will be due a physical in the fall.

## 2013-01-12 NOTE — Telephone Encounter (Signed)
Ok to schedule CPX in fall when due with me As prev requested, please keep track of how many patients are being absorbed by each provider at [facility] thanks

## 2013-01-13 NOTE — Telephone Encounter (Signed)
Pt is aware and scheduled for the end of Oct. 2015.

## 2013-01-29 ENCOUNTER — Other Ambulatory Visit: Payer: Self-pay

## 2013-01-29 ENCOUNTER — Other Ambulatory Visit: Payer: Self-pay | Admitting: Internal Medicine

## 2013-01-29 MED ORDER — PANTOPRAZOLE SODIUM 40 MG PO TBEC: 40.0000 mg | DELAYED_RELEASE_TABLET | Freq: Every day | ORAL | Status: DC

## 2013-01-29 NOTE — Telephone Encounter (Signed)
Refilled protonix , error in refill sent earlier.  Pharmacy Aaron Edelman) notified of this error.

## 2013-02-06 ENCOUNTER — Ambulatory Visit (INDEPENDENT_AMBULATORY_CARE_PROVIDER_SITE_OTHER): Payer: 59 | Admitting: Adult Health

## 2013-02-06 ENCOUNTER — Encounter: Payer: Self-pay | Admitting: Adult Health

## 2013-02-06 VITALS — BP 122/84 | HR 77 | Temp 97.6°F | Ht 69.0 in | Wt 223.3 lb

## 2013-02-06 DIAGNOSIS — L259 Unspecified contact dermatitis, unspecified cause: Secondary | ICD-10-CM

## 2013-02-06 DIAGNOSIS — L309 Dermatitis, unspecified: Secondary | ICD-10-CM | POA: Insufficient documentation

## 2013-02-06 MED ORDER — DESOXIMETASONE 0.05 % EX CREA
TOPICAL_CREAM | Freq: Two times a day (BID) | CUTANEOUS | Status: DC
Start: 2013-02-06 — End: 2013-11-23

## 2013-02-06 NOTE — Patient Instructions (Signed)
Apply topicort to rash for 1 week Twice daily  , if not improving call our office back  Please contact office for sooner follow up if symptoms do not improve or worsen or seek emergency care

## 2013-02-06 NOTE — Progress Notes (Signed)
   Subjective:    Patient ID: [name], female    DOB: 05-16-1960, 53 y.o.   MRN: [id number]  HPI 53  y/o BF here for a follow up visit and refill of meds... Followed for HBP; hx atyp CP & neg cardiac eval; Hypercholesterolemia Overweight  02/06/2013 Acute OV  Complains of red, raised, round itching place on right forearm x2 days. Tried ring work cream otc on this yesterday with no improvement . No pets.  No new meds or recent travel.  No previous episodes. No known injury.     Review of Systems Constitutional:   No  weight loss, night sweats,  Fevers, chills, fatigue, or  lassitude.  HEENT:   No headaches,  Difficulty swallowing,  Tooth/dental problems, or  Sore throat,                No sneezing, itching, ear ache, nasal congestion, post nasal drip,   CV:  No chest pain,  Orthopnea, PND, swelling in lower extremities, anasarca, dizziness, palpitations, syncope.   GI  No heartburn, indigestion, abdominal pain, nausea, vomiting, diarrhea, change in bowel habits, loss of appetite, bloody stools.   Resp: No shortness of breath with exertion or at rest.  No excess mucus, no productive cough,  No non-productive cough,  No coughing up of blood.  No change in color of mucus.  No wheezing.  No chest wall deformity  Skin: +rash  GU: no dysuria, change in color of urine, no urgency or frequency.  No flank pain, no hematuria   MS:  No joint pain or swelling.  No decreased range of motion.  No back pain.  Psych:  No change in mood or affect. No depression or anxiety.  No memory loss.          Objective:   Physical Exam GEN: A/Ox3; pleasant , NAD, well nourished   HEENT:  [place]/AT,  EACs-clear, TMs-wnl, NOSE-clear, THROAT-clear, no lesions, no postnasal drip or exudate noted.   NECK:  Supple w/ fair ROM; no JVD; normal carotid impulses w/o bruits; no thyromegaly or nodules palpated; no lymphadenopathy.  RESP  Clear  P & A; w/o, wheezes/ rales/ or rhonchi.no accessory muscle  use, no dullness to percussion  CARD:  RRR, no m/r/g  , no peripheral edema, pulses intact, no cyanosis or clubbing.  GI:   Soft & nt; nml bowel sounds; no organomegaly or masses detected.  Musco: Warm bil, no deformities or joint swelling noted.   Neuro: alert, no focal deficits noted.    Skin: Warm, along right medial distal forearm with circular red lesion , no central clearing, approximately 0.25cm.          Assessment & Plan:

## 2013-02-06 NOTE — Assessment & Plan Note (Addendum)
Right arm dermatitis ? Etiology  Trial of steroid cream  If not improving will need trial of antifungal cream or refer to derm  Plan  Apply topicort to rash for 1 week Twice daily  , if not improving call our office back  Please contact office for sooner follow up if symptoms do not improve or worsen or seek emergency care

## 2013-08-07 ENCOUNTER — Encounter: Payer: Self-pay | Admitting: Dietician

## 2013-08-07 ENCOUNTER — Encounter: Payer: 59 | Attending: Internal Medicine | Admitting: Dietician

## 2013-08-07 VITALS — Ht 68.0 in | Wt 215.6 lb

## 2013-08-07 DIAGNOSIS — Z6833 Body mass index (BMI) 33.0-33.9, adult: Secondary | ICD-10-CM | POA: Insufficient documentation

## 2013-08-07 DIAGNOSIS — E669 Obesity, unspecified: Secondary | ICD-10-CM | POA: Diagnosis not present

## 2013-08-07 DIAGNOSIS — Z713 Dietary counseling and surveillance: Secondary | ICD-10-CM | POA: Insufficient documentation

## 2013-08-07 NOTE — Progress Notes (Signed)
  Medical Nutrition Therapy:  Appt start time: 0800 end time:  0830.   Assessment:  Primary concerns today: [name] is here today for wellness program. She walks for 25-30 minutes every morning before work.  She reports that she has been watching her portions and trying to eat healthy since December 2014. Recently lost over 6 pounds and states her clothes are fitting better. [name] works for [employer]. When she eats out at restaurants, she packs away 1/2 her food for the next day. She has a strong family history of diabetes and reports that she wants to prevent onset.  Preferred Learning Style:   No preference indicated   Learning Readiness:   Ready  Change in progress   MEDICATIONS: see list (recently came off HCTZ)   DIETARY INTAKE:  Usual eating pattern includes 3 meals and 2 snacks per day.  24-hr recall:  B ( AM): Premier protein shake and diet green tea Snk ( AM): yogurt or banana L ( PM): salad with chicken or tuna Snk ( PM): almonds D ( PM): Kielbasa sausage with sauerkraut and chips OR grilled chicken or pork with green bean casserole and mac and cheese Snk ( PM):   Beverages: diet green tea, coke 1x a month, water  Usual physical activity: walking every morning before work  Estimated energy needs: 1600-1800 calories  Progress Towards Goal(s):  In progress.   Nutritional Diagnosis:  [place]-3.3 Overweight/obesity As related to history of excessive energy intake and inappropriate food choices.  As evidenced by BMI 33.    Intervention:  Nutrition counseling provided. Encouraged the patient to continue making healthy choices, watching portion sizes, and exercising.  Teaching Method Utilized:  Auditory  Barriers to learning/adherence to lifestyle change: none  Demonstrated degree of understanding via:  Teach Back   Monitoring/Evaluation:  Dietary intake, exercise, and body weight prn.

## 2013-11-06 ENCOUNTER — Ambulatory Visit: Payer: 59 | Admitting: *Deleted

## 2013-11-06 ENCOUNTER — Ambulatory Visit: Payer: 59 | Admitting: Internal Medicine

## 2013-11-06 ENCOUNTER — Encounter: Payer: 59 | Admitting: Internal Medicine

## 2013-11-23 ENCOUNTER — Ambulatory Visit (INDEPENDENT_AMBULATORY_CARE_PROVIDER_SITE_OTHER): Payer: 59 | Admitting: Internal Medicine

## 2013-11-23 ENCOUNTER — Encounter: Payer: Self-pay | Admitting: Internal Medicine

## 2013-11-23 VITALS — BP 118/76 | HR 81 | Temp 97.8°F | Resp 14 | Ht 68.5 in | Wt 212.8 lb

## 2013-11-23 DIAGNOSIS — I10 Essential (primary) hypertension: Secondary | ICD-10-CM

## 2013-11-23 DIAGNOSIS — E785 Hyperlipidemia, unspecified: Secondary | ICD-10-CM

## 2013-11-23 DIAGNOSIS — Z0000 Encounter for general adult medical examination without abnormal findings: Secondary | ICD-10-CM

## 2013-11-23 DIAGNOSIS — E663 Overweight: Secondary | ICD-10-CM

## 2013-11-23 MED ORDER — ATENOLOL 50 MG PO TABS: 50.0000 mg | ORAL_TABLET | Freq: Every day | ORAL | Status: DC

## 2013-11-23 NOTE — Progress Notes (Signed)
Pre visit review using our clinic review tool, if applicable. No additional management support is needed unless otherwise documented below in the visit note. 

## 2013-11-23 NOTE — Patient Instructions (Signed)
We will check your lab work today. We will call you with those results.  Keep up the good work with your diet and exercise.  We will see back in about a year, please feel free to call our office if you need anything before your next visit.  Health Maintenance Adopting a healthy lifestyle and getting preventive care can go a long way to promote health and wellness. Talk with your health care provider about what schedule of regular examinations is right for you. This is a good chance for you to check in with your provider about disease prevention and staying healthy. In between checkups, there are plenty of things you can do on your own. Experts have done a lot of research about which lifestyle changes and preventive measures are most likely to keep you healthy. Ask your health care provider for more information. WEIGHT AND DIET  Eat a healthy diet  Be sure to include plenty of vegetables, fruits, low-fat dairy products, and lean protein.  Do not eat a lot of foods high in solid fats, added sugars, or salt.  Get regular exercise. This is one of the most important things you can do for your health.  Most adults should exercise for at least 150 minutes each week. The exercise should increase your heart rate and make you sweat (moderate-intensity exercise).  Most adults should also do strengthening exercises at least twice a week. This is in addition to the moderate-intensity exercise.  Maintain a healthy weight  Body mass index (BMI) is a measurement that can be used to identify possible weight problems. It estimates body fat based on height and weight. Your health care provider can help determine your BMI and help you achieve or maintain a healthy weight.  For females 58 years of age and older:   A BMI below 18.5 is considered underweight.  A BMI of 18.5 to 24.9 is normal.  A BMI of 25 to 29.9 is considered overweight.  A BMI of 30 and above is considered obese.  Watch levels of  cholesterol and blood lipids  You should start having your blood tested for lipids and cholesterol at 55 years of age, then have this test every 5 years.  You may need to have your cholesterol levels checked more often if:  Your lipid or cholesterol levels are high.  You are older than 55 years of age.  You are at high risk for heart disease.  CANCER SCREENING   Lung Cancer  Lung cancer screening is recommended for adults 63-86 years old who are at high risk for lung cancer because of a history of smoking.  A yearly low-dose CT scan of the lungs is recommended for people who:  Currently smoke.  Have quit within the past 15 years.  Have at least a 30-pack-year history of smoking. A pack year is smoking an average of one pack of cigarettes a day for 1 year.  Yearly screening should continue until it has been 15 years since you quit.  Yearly screening should stop if you develop a health problem that would prevent you from having lung cancer treatment.  Breast Cancer  Practice breast self-awareness. This means understanding how your breasts normally appear and feel.  It also means doing regular breast self-exams. Let your health care provider know about any changes, no matter how small.  If you are in your 20s or 30s, you should have a clinical breast exam (CBE) by a health care provider every 1-3 years as  part of a regular health exam.  If you are 40 or older, have a CBE every year. Also consider having a breast X-ray (mammogram) every year.  If you have a family history of breast cancer, talk to your health care provider about genetic screening.  If you are at high risk for breast cancer, talk to your health care provider about having an MRI and a mammogram every year.  Breast cancer gene (BRCA) assessment is recommended for women who have family members with BRCA-related cancers. BRCA-related cancers include:  Breast.  Ovarian.  Tubal.  Peritoneal  cancers.  Results of the assessment will determine the need for genetic counseling and BRCA1 and BRCA2 testing. Cervical Cancer Routine pelvic examinations to screen for cervical cancer are no longer recommended for nonpregnant women who are considered low risk for cancer of the pelvic organs (ovaries, uterus, and vagina) and who do not have symptoms. A pelvic examination may be necessary if you have symptoms including those associated with pelvic infections. Ask your health care provider if a screening pelvic exam is right for you.   The Pap test is the screening test for cervical cancer for women who are considered at risk.  If you had a hysterectomy for a problem that was not cancer or a condition that could lead to cancer, then you no longer need Pap tests.  If you are older than 65 years, and you have had normal Pap tests for the past 10 years, you no longer need to have Pap tests.  If you have had past treatment for cervical cancer or a condition that could lead to cancer, you need Pap tests and screening for cancer for at least 20 years after your treatment.  If you no longer get a Pap test, assess your risk factors if they change (such as having a new sexual partner). This can affect whether you should start being screened again.  Some women have medical problems that increase their chance of getting cervical cancer. If this is the case for you, your health care provider may recommend more frequent screening and Pap tests.  The human papillomavirus (HPV) test is another test that may be used for cervical cancer screening. The HPV test looks for the virus that can cause cell changes in the cervix. The cells collected during the Pap test can be tested for HPV.  The HPV test can be used to screen women 72 years of age and older. Getting tested for HPV can extend the interval between normal Pap tests from three to five years.  An HPV test also should be used to screen women of any age who  have unclear Pap test results.  After 55 years of age, women should have HPV testing as often as Pap tests.  Colorectal Cancer  This type of cancer can be detected and often prevented.  Routine colorectal cancer screening usually begins at 55 years of age and continues through 55 years of age.  Your health care provider may recommend screening at an earlier age if you have risk factors for colon cancer.  Your health care provider may also recommend using home test kits to check for hidden blood in the stool.  A small camera at the end of a tube can be used to examine your colon directly (sigmoidoscopy or colonoscopy). This is done to check for the earliest forms of colorectal cancer.  Routine screening usually begins at age 61.  Direct examination of the colon should be repeated every 5-10  years through 55 years of age. However, you may need to be screened more often if early forms of precancerous polyps or small growths are found. Skin Cancer  Check your skin from head to toe regularly.  Tell your health care provider about any new moles or changes in moles, especially if there is a change in a mole's shape or color.  Also tell your health care provider if you have a mole that is larger than the size of a pencil eraser.  Always use sunscreen. Apply sunscreen liberally and repeatedly throughout the day.  Protect yourself by wearing long sleeves, pants, a wide-brimmed hat, and sunglasses whenever you are outside. HEART DISEASE, DIABETES, AND HIGH BLOOD PRESSURE   Have your blood pressure checked at least every 1-2 years. High blood pressure causes heart disease and increases the risk of stroke.  If you are between 39 years and 49 years old, ask your health care provider if you should take aspirin to prevent strokes.  Have regular diabetes screenings. This involves taking a blood sample to check your fasting blood sugar level.  If you are at a normal weight and have a low risk for  diabetes, have this test once every three years after 55 years of age.  If you are overweight and have a high risk for diabetes, consider being tested at a younger age or more often. PREVENTING INFECTION  Hepatitis B  If you have a higher risk for hepatitis B, you should be screened for this virus. You are considered at high risk for hepatitis B if:  You were born in a country where hepatitis B is common. Ask your health care provider which countries are considered high risk.  Your parents were born in a high-risk country, and you have not been immunized against hepatitis B (hepatitis B vaccine).  You have HIV or AIDS.  You use needles to inject street drugs.  You live with someone who has hepatitis B.  You have had sex with someone who has hepatitis B.  You get hemodialysis treatment.  You take certain medicines for conditions, including cancer, organ transplantation, and autoimmune conditions. Hepatitis C  Blood testing is recommended for:  Everyone born from [birth year] through 1967.  Anyone with known risk factors for hepatitis C. Sexually transmitted infections (STIs)  You should be screened for sexually transmitted infections (STIs) including gonorrhea and chlamydia if:  You are sexually active and are younger than 55 years of age.  You are older than 55 years of age and your health care provider tells you that you are at risk for this type of infection.  Your sexual activity has changed since you were last screened and you are at an increased risk for chlamydia or gonorrhea. Ask your health care provider if you are at risk.  If you do not have HIV, but are at risk, it may be recommended that you take a prescription medicine daily to prevent HIV infection. This is called pre-exposure prophylaxis (PrEP). You are considered at risk if:  You are sexually active and do not regularly use condoms or know the HIV status of your partner(s).  You take drugs by injection.  You are  sexually active with a partner who has HIV. Talk with your health care provider about whether you are at high risk of being infected with HIV. If you choose to begin PrEP, you should first be tested for HIV. You should then be tested every 3 months for as long as you are  taking PrEP.  PREGNANCY   If you are premenopausal and you may become pregnant, ask your health care provider about preconception counseling.  If you may become pregnant, take 400 to 800 micrograms (mcg) of folic acid every day.  If you want to prevent pregnancy, talk to your health care provider about birth control (contraception). OSTEOPOROSIS AND MENOPAUSE   Osteoporosis is a disease in which the bones lose minerals and strength with aging. This can result in serious bone fractures. Your risk for osteoporosis can be identified using a bone density scan.  If you are 70 years of age or older, or if you are at risk for osteoporosis and fractures, ask your health care provider if you should be screened.  Ask your health care provider whether you should take a calcium or vitamin D supplement to lower your risk for osteoporosis.  Menopause may have certain physical symptoms and risks.  Hormone replacement therapy may reduce some of these symptoms and risks. Talk to your health care provider about whether hormone replacement therapy is right for you.  HOME CARE INSTRUCTIONS   Schedule regular health, dental, and eye exams.  Stay current with your immunizations.   Do not use any tobacco products including cigarettes, chewing tobacco, or electronic cigarettes.  If you are pregnant, do not drink alcohol.  If you are breastfeeding, limit how much and how often you drink alcohol.  Limit alcohol intake to no more than 1 drink per day for nonpregnant women. One drink equals 12 ounces of beer, 5 ounces of wine, or 1 ounces of hard liquor.  Do not use street drugs.  Do not share needles.  Ask your health care provider for  help if you need support or information about quitting drugs.  Tell your health care provider if you often feel depressed.  Tell your health care provider if you have ever been abused or do not feel safe at home. Document Released: 07/10/2010 Document Revised: 05/11/2013 Document Reviewed: 11/26/2012 [facility] Patient Information 2015 [place], [place]. This information is not intended to replace advice given to you by your health care provider. Make sure you discuss any questions you have with your health care provider.

## 2013-11-24 ENCOUNTER — Other Ambulatory Visit (INDEPENDENT_AMBULATORY_CARE_PROVIDER_SITE_OTHER): Payer: 59

## 2013-11-24 DIAGNOSIS — E785 Hyperlipidemia, unspecified: Secondary | ICD-10-CM

## 2013-11-24 DIAGNOSIS — I10 Essential (primary) hypertension: Secondary | ICD-10-CM

## 2013-11-24 LAB — BASIC METABOLIC PANEL
BUN: 18 mg/dL (ref 6–23)
CHLORIDE: 109 meq/L (ref 96–112)
CO2: 26 mEq/L (ref 19–32)
Calcium: 9.4 mg/dL (ref 8.4–10.5)
Creatinine, Ser: 0.9 mg/dL (ref 0.4–1.2)
GFR: 80.53 mL/min (ref >60.00)
GLUCOSE: 102 mg/dL — AB (ref 70–99)
POTASSIUM: 4.3 meq/L (ref 3.5–5.1)
SODIUM: 140 meq/L (ref 135–145)

## 2013-11-24 LAB — LIPID PANEL
CHOLESTEROL: 263 mg/dL — AB (ref 0–200)
HDL: 61.7 mg/dL (ref >39.00)
LDL Cholesterol: 188 mg/dL — ABNORMAL HIGH (ref 0–99)
NonHDL: 201.3
TRIGLYCERIDES: 65 mg/dL (ref 0.0–149.0)
Total CHOL/HDL Ratio: 4
VLDL: 13 mg/dL (ref 0.0–40.0)

## 2013-11-24 NOTE — Assessment & Plan Note (Signed)
Spoke with the patient about the need to lose weight, also reminded her that extra weight and her body puts extra stress on her joints and may lead to early arthritis. She is working on increasing her exercise. Also spoke about dietary modifications she can make.

## 2013-11-24 NOTE — Assessment & Plan Note (Signed)
BP well-controlled on atenolol. Check basic metabolic panel today.

## 2013-11-24 NOTE — Assessment & Plan Note (Signed)
Check lipid panel today to see how Lipitor is doing. No side effects from statin therapy.

## 2013-11-24 NOTE — Assessment & Plan Note (Signed)
Patient due next colonoscopy in 2019,patient declined flu shot at today's visit. Up-to-date on mammogram, tetanus.

## 2013-11-24 NOTE — Progress Notes (Signed)
   Subjective:    Patient ID: [name], female    DOB: 12/18/1960, 53 y.o.   MRN: [id number]  HPI The patient is a 53 year old female who comes in today to establish care. She has past medical history of hypercholesterol, hypertension, overweight. She is doing fairly well overall and has no new complaints at today's visit. She denies any chest pains, shortness of breath, abdominal pain.  Review of Systems  Constitutional: Negative for fever, activity change, appetite change, fatigue and unexpected weight change.  HENT: Negative.   Respiratory: Negative for cough, chest tightness, shortness of breath and wheezing.   Cardiovascular: Negative for chest pain, palpitations and leg swelling.  Gastrointestinal: Negative for abdominal pain, diarrhea, constipation, blood in stool and abdominal distention.  Musculoskeletal: Negative.   Skin: Negative.   Neurological: Negative.   Psychiatric/Behavioral: Negative.       Objective:   Physical Exam  Constitutional: She is oriented to person, place, and time. She appears well-developed and well-nourished.  HENT:  Head: Normocephalic and atraumatic.  Eyes: EOM are normal.  Neck: Normal range of motion.  Cardiovascular: Normal rate and regular rhythm.   Pulmonary/Chest: Effort normal and breath sounds normal. No respiratory distress. She has no wheezes. She has no rales.  Abdominal: Soft. Bowel sounds are normal. She exhibits no distension. There is no tenderness. There is no rebound.  Musculoskeletal: Normal range of motion.  Neurological: She is alert and oriented to person, place, and time. Coordination normal.  Skin: Skin is warm and dry.   Filed Vitals:   11/23/13 1421  BP: 118/76  Pulse: 81  Temp: 97.8 F (36.6 C)  Resp: 14  Height: 5' 8.5" (1.74 m)  Weight: 212 lb 12.8 oz (96.525 kg)  SpO2: 97%      Assessment & Plan:

## 2013-11-26 MED ORDER — ATORVASTATIN CALCIUM 20 MG PO TABS: 20.0000 mg | ORAL_TABLET | Freq: Every day | ORAL | Status: DC

## 2013-11-26 NOTE — Addendum Note (Signed)
Addended by: Vertell Novak A on: 11/26/2013 07:57 AM   Modules accepted: Orders

## 2013-12-07 ENCOUNTER — Encounter: Payer: Self-pay | Admitting: Internal Medicine

## 2013-12-08 MED ORDER — PRAVASTATIN SODIUM 20 MG PO TABS: 20.0000 mg | ORAL_TABLET | Freq: Every day | ORAL | Status: DC

## 2014-01-15 ENCOUNTER — Encounter: Payer: 59 | Attending: Internal Medicine | Admitting: Dietician

## 2014-01-15 VITALS — Ht 68.5 in | Wt 215.1 lb

## 2014-01-15 DIAGNOSIS — Z6832 Body mass index (BMI) 32.0-32.9, adult: Secondary | ICD-10-CM | POA: Diagnosis not present

## 2014-01-15 DIAGNOSIS — Z713 Dietary counseling and surveillance: Secondary | ICD-10-CM | POA: Insufficient documentation

## 2014-01-15 DIAGNOSIS — E669 Obesity, unspecified: Secondary | ICD-10-CM | POA: Diagnosis present

## 2014-01-15 NOTE — Progress Notes (Signed)
  Medical Nutrition Therapy:  Appt start time: 0800 end time:  0830.   Assessment:  Primary concerns today: [name] is here today for [employer] program. She reports with good progress: Clothes fitting better/looser, able to button a pair of pants that used to be tight, more energy.  States she feels great.  She self-reports losing down to 209 lbs but regained some over the holidays, maintaining at a weight of 215 lbs today. She reports her last physical revealed that her BP and BG are in good control.  Tanita scale measurements: Weight 215.5 lbs, TBW 80 lbs, FFM 109.5 lbs, Fat Mass 106 lbs, Fat% 49.1%   Learning Readiness:   Ready  Change in progress  MEDICATIONS: see list   DIETARY INTAKE:  Usual eating pattern includes 3 meals and 2 snacks per day.  24-hr recall:  B ( AM): Premier protein shake (30 g pro) and diet green tea, coffee OR egg and veggie omelet Snk (10 AM): almonds and lipton green tea L ( PM): grilled chicken or tuna, starch, veggie, salad with [place] dressing with Brag Vinegar Snk ( PM): almonds or fruit D ( PM): Kielbasa sausage with sauerkraut and chips OR grilled chicken or pork with green bean casserole and mac and cheese Snk ( PM): not usually  Beverages: diet green tea, coke 1x a month, water  Usual physical activity: walking every morning before work 20-30 min. at the work gym or when off work on the [employer] needs: 1600-1800 calories  Progress Towards Goal(s):  In progress.   Nutritional Diagnosis:  [place]-3.3 Overweight/obesity As related to history of excessive energy intake and inappropriate food choices.  As evidenced by BMI 33.    Intervention:  Nutrition counseling provided. Encouraged the patient to continue making healthy choices, watching portion sizes, and exercising. Recommended variety of colorful veggies, lean proteins, and continued small portioning of starches.  Encouraged goal setting and increasing vigor of  exercise/adding strength training.   Provided patient with Tanita Scale measurements for body fat% , total body water, and muscle mass.  Teaching Method Utilized:  Auditory Visual  Barriers to learning/adherence to lifestyle change: none  Demonstrated degree of understanding via:  Teach Back   Monitoring/Evaluation:  Dietary intake, exercise, and body weight prn.

## 2014-01-19 ENCOUNTER — Other Ambulatory Visit: Payer: Self-pay

## 2014-01-19 MED ORDER — ESOMEPRAZOLE MAGNESIUM 40 MG PO CPDR: 40.0000 mg | DELAYED_RELEASE_CAPSULE | Freq: Every day | ORAL | Status: DC

## 2014-01-19 NOTE — Telephone Encounter (Signed)
Per Dr. Carlean Purl ok to change her PPI rx to Nexium from Protonix for insurance coverage reasons.  Rx sent to [facility] as requested.

## 2014-08-01 ENCOUNTER — Emergency Department (HOSPITAL_COMMUNITY)
Admission: EM | Admit: 2014-08-01 | Discharge: 2014-08-01 | Disposition: A | Discharge status: Home / Self Care | Payer: 59 | Attending: Emergency Medicine | Admitting: Emergency Medicine

## 2014-08-01 ENCOUNTER — Encounter (HOSPITAL_COMMUNITY): Payer: Self-pay

## 2014-08-01 ENCOUNTER — Emergency Department (HOSPITAL_COMMUNITY): Payer: 59

## 2014-08-01 DIAGNOSIS — F419 Anxiety disorder, unspecified: Secondary | ICD-10-CM | POA: Diagnosis not present

## 2014-08-01 DIAGNOSIS — Y9389 Activity, other specified: Secondary | ICD-10-CM | POA: Diagnosis not present

## 2014-08-01 DIAGNOSIS — E669 Obesity, unspecified: Secondary | ICD-10-CM | POA: Insufficient documentation

## 2014-08-01 DIAGNOSIS — K219 Gastro-esophageal reflux disease without esophagitis: Secondary | ICD-10-CM | POA: Insufficient documentation

## 2014-08-01 DIAGNOSIS — Z79899 Other long term (current) drug therapy: Secondary | ICD-10-CM | POA: Insufficient documentation

## 2014-08-01 DIAGNOSIS — Z7982 Long term (current) use of aspirin: Secondary | ICD-10-CM | POA: Diagnosis not present

## 2014-08-01 DIAGNOSIS — Z8669 Personal history of other diseases of the nervous system and sense organs: Secondary | ICD-10-CM | POA: Diagnosis not present

## 2014-08-01 DIAGNOSIS — E785 Hyperlipidemia, unspecified: Secondary | ICD-10-CM | POA: Diagnosis not present

## 2014-08-01 DIAGNOSIS — S62666A Nondisplaced fracture of distal phalanx of right little finger, initial encounter for closed fracture: Secondary | ICD-10-CM | POA: Diagnosis not present

## 2014-08-01 DIAGNOSIS — J45909 Unspecified asthma, uncomplicated: Secondary | ICD-10-CM | POA: Insufficient documentation

## 2014-08-01 DIAGNOSIS — I10 Essential (primary) hypertension: Secondary | ICD-10-CM | POA: Insufficient documentation

## 2014-08-01 DIAGNOSIS — W231XXA Caught, crushed, jammed, or pinched between stationary objects, initial encounter: Secondary | ICD-10-CM | POA: Insufficient documentation

## 2014-08-01 DIAGNOSIS — S62609A Fracture of unspecified phalanx of unspecified finger, initial encounter for closed fracture: Secondary | ICD-10-CM

## 2014-08-01 DIAGNOSIS — Y998 Other external cause status: Secondary | ICD-10-CM | POA: Diagnosis not present

## 2014-08-01 DIAGNOSIS — Z8505 Personal history of malignant neoplasm of liver: Secondary | ICD-10-CM | POA: Insufficient documentation

## 2014-08-01 DIAGNOSIS — Y9289 Other specified places as the place of occurrence of the external cause: Secondary | ICD-10-CM | POA: Diagnosis not present

## 2014-08-01 DIAGNOSIS — S6991XA Unspecified injury of right wrist, hand and finger(s), initial encounter: Secondary | ICD-10-CM | POA: Diagnosis present

## 2014-08-01 MED ORDER — IBUPROFEN 800 MG PO TABS
800.0000 mg | ORAL_TABLET | Freq: Once | ORAL | Status: AC
  Administered 2014-08-01: 800 mg via ORAL
  Filled 2014-08-01: qty 1
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
## 2014-08-01 NOTE — Discharge Instructions (Signed)

## 2014-08-01 NOTE — ED Notes (Signed)
Pt presents with c/o right pinky finger injury that occurred approx 2 hours ago. Pt reports she believes she hit her finger on the counter, heard a crack when she hit her finger.

## 2014-08-02 ENCOUNTER — Encounter: Payer: Self-pay | Admitting: Internal Medicine

## 2014-08-03 ENCOUNTER — Ambulatory Visit (INDEPENDENT_AMBULATORY_CARE_PROVIDER_SITE_OTHER): Payer: 59 | Admitting: Internal Medicine

## 2014-08-03 ENCOUNTER — Encounter: Payer: Self-pay | Admitting: Internal Medicine

## 2014-08-03 VITALS — BP 112/76 | HR 89 | Temp 98.6°F | Resp 12 | Ht 68.0 in | Wt 215.0 lb

## 2014-08-03 DIAGNOSIS — S62656D Nondisplaced fracture of medial phalanx of right little finger, subsequent encounter for fracture with routine healing: Secondary | ICD-10-CM | POA: Diagnosis not present

## 2014-08-03 DIAGNOSIS — S62656A Nondisplaced fracture of medial phalanx of right little finger, initial encounter for closed fracture: Secondary | ICD-10-CM | POA: Diagnosis not present

## 2014-08-03 NOTE — Progress Notes (Signed)
Pre visit review using our clinic review tool, if applicable. No additional management support is needed unless otherwise documented below in the visit note. 

## 2014-08-03 NOTE — Progress Notes (Signed)
   Subjective:    Patient ID: [name], female    DOB: [birth year], 54 y.o.   MRN: [id number]  HPI The patient is a 54 YO female coming in for right 5th middle phalynx broken. She was seen at the ER 2 days ago and put in the splint. It was not displaced. She has not seen hand surgery or orthopedics. Is using ibuprofen for pain with good success. Wants bone density as she has been in menopause for about 10 years now. Was doing some cleaning in the kitchen when it caught on something and cracked.   Review of Systems  Constitutional: Negative for fever, activity change, appetite change, fatigue and unexpected weight change.  Respiratory: Negative.   Musculoskeletal: Positive for myalgias and arthralgias. Negative for back pain and gait problem.  Skin: Negative.   Neurological: Negative.       Objective:   Physical Exam  Constitutional: She appears well-developed and well-nourished.  HENT:  Head: Normocephalic and atraumatic.  Eyes: EOM are normal.  Cardiovascular: Normal rate and regular rhythm.   Pulmonary/Chest: Effort normal. No respiratory distress. She has no wheezes.  Abdominal: Soft.  Musculoskeletal:  Right 5th finger in soft splint with metal brace on the bottom of the finger.    Filed Vitals:   08/03/14 1403  BP: 112/76  Pulse: 89  Temp: 98.6 F (37 C)  TempSrc: Oral  Resp: 12  Height: 5\' 8"  (1.727 m)  Weight: 215 lb (97.523 kg)  SpO2: 97%      Assessment & Plan:

## 2014-08-03 NOTE — Patient Instructions (Signed)
We will have you come back Monday for an x-ray of the finger to make sure the bones are not moving.   We will also get the bone density to check on the bone strength.

## 2014-08-03 NOTE — Assessment & Plan Note (Addendum)
Fractured 2 days ago and in splint. Will repeat x-ray in 1 week to make sure it is not displaced. If displaced then needs urgent ortho or hand visit. She can continue to use ibuprofen for pain. Advised that healing generally takes 4-6 weeks. Ordered bone density given the fracture without much provocation.

## 2014-08-09 ENCOUNTER — Ambulatory Visit (INDEPENDENT_AMBULATORY_CARE_PROVIDER_SITE_OTHER)
Admission: RE | Admit: 2014-08-09 | Discharge: 2014-08-09 | Disposition: A | Discharge status: Home / Self Care | Payer: 59 | Source: Ambulatory Visit | Attending: Internal Medicine | Admitting: Internal Medicine

## 2014-08-09 ENCOUNTER — Other Ambulatory Visit: Payer: Self-pay | Admitting: Internal Medicine

## 2014-08-09 DIAGNOSIS — S62656D Nondisplaced fracture of medial phalanx of right little finger, subsequent encounter for fracture with routine healing: Secondary | ICD-10-CM

## 2014-08-09 DIAGNOSIS — M25562 Pain in left knee: Secondary | ICD-10-CM

## 2014-08-10 ENCOUNTER — Other Ambulatory Visit: Payer: Self-pay | Admitting: Internal Medicine

## 2014-08-10 ENCOUNTER — Ambulatory Visit (HOSPITAL_COMMUNITY)
Admission: RE | Admit: 2014-08-10 | Discharge: 2014-08-10 | Disposition: A | Discharge status: Home / Self Care | Payer: 59 | Source: Ambulatory Visit | Attending: Internal Medicine | Admitting: Internal Medicine

## 2014-08-10 ENCOUNTER — Encounter: Payer: Self-pay | Admitting: Internal Medicine

## 2014-08-10 DIAGNOSIS — M25562 Pain in left knee: Secondary | ICD-10-CM

## 2014-08-10 DIAGNOSIS — M7989 Other specified soft tissue disorders: Secondary | ICD-10-CM | POA: Diagnosis not present

## 2014-08-10 DIAGNOSIS — M79605 Pain in left leg: Secondary | ICD-10-CM | POA: Insufficient documentation

## 2014-08-10 NOTE — Progress Notes (Signed)
Left lower extremity venous duplex completed.  Left:  No evidence of DVT, superficial thrombosis, or Baker's cyst.  Right:  Negative for DVT in the common femoral vein.  

## 2014-08-17 ENCOUNTER — Encounter (HOSPITAL_COMMUNITY): Payer: 59

## 2014-08-23 ENCOUNTER — Ambulatory Visit (INDEPENDENT_AMBULATORY_CARE_PROVIDER_SITE_OTHER)
Admission: RE | Admit: 2014-08-23 | Discharge: 2014-08-23 | Disposition: A | Discharge status: Home / Self Care | Payer: 59 | Source: Ambulatory Visit | Attending: Internal Medicine | Admitting: Internal Medicine

## 2014-08-23 ENCOUNTER — Telehealth: Payer: Self-pay

## 2014-08-23 DIAGNOSIS — S62656D Nondisplaced fracture of medial phalanx of right little finger, subsequent encounter for fracture with routine healing: Secondary | ICD-10-CM

## 2014-08-23 DIAGNOSIS — S6291XS Unspecified fracture of right wrist and hand, sequela: Secondary | ICD-10-CM

## 2014-08-23 NOTE — Telephone Encounter (Signed)
Xray called. Needed order placed to repeat xray of pt rt hand.

## 2014-10-07 ENCOUNTER — Ambulatory Visit: Payer: 59 | Attending: Orthopaedic Surgery | Admitting: Physical Therapy

## 2014-10-07 DIAGNOSIS — M545 Low back pain, unspecified: Secondary | ICD-10-CM

## 2014-10-07 DIAGNOSIS — M25562 Pain in left knee: Secondary | ICD-10-CM | POA: Insufficient documentation

## 2014-10-07 DIAGNOSIS — M62838 Other muscle spasm: Secondary | ICD-10-CM | POA: Insufficient documentation

## 2014-10-07 DIAGNOSIS — M2560 Stiffness of unspecified joint, not elsewhere classified: Secondary | ICD-10-CM | POA: Diagnosis present

## 2014-10-07 DIAGNOSIS — M5386 Other specified dorsopathies, lumbar region: Secondary | ICD-10-CM

## 2014-10-07 DIAGNOSIS — M25662 Stiffness of left knee, not elsewhere classified: Secondary | ICD-10-CM

## 2014-10-07 DIAGNOSIS — R29898 Other symptoms and signs involving the musculoskeletal system: Secondary | ICD-10-CM | POA: Diagnosis present
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
## 2014-10-07 NOTE — Patient Instructions (Signed)
   Kristoffer Leamon PT, DPT, LAT, ATC  [facility] Phone: [phone]     

## 2014-11-09 ENCOUNTER — Encounter: Payer: Self-pay | Admitting: Internal Medicine

## 2014-11-09 ENCOUNTER — Ambulatory Visit (INDEPENDENT_AMBULATORY_CARE_PROVIDER_SITE_OTHER): Payer: 59 | Admitting: Internal Medicine

## 2014-11-09 VITALS — BP 112/78 | HR 82 | Temp 98.4°F | Resp 14 | Ht 68.0 in | Wt 227.0 lb

## 2014-11-09 DIAGNOSIS — Z0000 Encounter for general adult medical examination without abnormal findings: Secondary | ICD-10-CM

## 2014-11-09 DIAGNOSIS — E669 Obesity, unspecified: Secondary | ICD-10-CM

## 2014-11-09 DIAGNOSIS — I10 Essential (primary) hypertension: Secondary | ICD-10-CM

## 2014-11-09 DIAGNOSIS — E785 Hyperlipidemia, unspecified: Secondary | ICD-10-CM

## 2014-11-09 NOTE — Assessment & Plan Note (Signed)
Talked to her about the need for exercise and once her knee is feeling better she will do that.

## 2014-11-09 NOTE — Patient Instructions (Signed)
We will have you come fasting to the lab to get your blood work done.   No changes to the medicines today.   When your knee is feeling better work on getting back into exercising 3-4 times per week.    Health Maintenance, Female Adopting a healthy lifestyle and getting preventive care can go a long way to promote health and wellness. Talk with your health care provider about what schedule of regular examinations is right for you. This is a good chance for you to check in with your provider about disease prevention and staying healthy. In between checkups, there are plenty of things you can do on your own. Experts have done a lot of research about which lifestyle changes and preventive measures are most likely to keep you healthy. Ask your health care provider for more information. WEIGHT AND DIET  Eat a healthy diet  Be sure to include plenty of vegetables, fruits, low-fat dairy products, and lean protein.  Do not eat a lot of foods high in solid fats, added sugars, or salt.  Get regular exercise. This is one of the most important things you can do for your health.  Most adults should exercise for at least 150 minutes each week. The exercise should increase your heart rate and make you sweat (moderate-intensity exercise).  Most adults should also do strengthening exercises at least twice a week. This is in addition to the moderate-intensity exercise.  Maintain a healthy weight  Body mass index (BMI) is a measurement that can be used to identify possible weight problems. It estimates body fat based on height and weight. Your health care provider can help determine your BMI and help you achieve or maintain a healthy weight.  For females 86 years of age and older:   A BMI below 18.5 is considered underweight.  A BMI of 18.5 to 24.9 is normal.  A BMI of 25 to 29.9 is considered overweight.  A BMI of 30 and above is considered obese.  Watch levels of cholesterol and blood  lipids  You should start having your blood tested for lipids and cholesterol at 56 years of age, then have this test every 5 years.  You may need to have your cholesterol levels checked more often if:  Your lipid or cholesterol levels are high.  You are older than 56 years of age.  You are at high risk for heart disease.  CANCER SCREENING   Lung Cancer  Lung cancer screening is recommended for adults 12-44 years old who are at high risk for lung cancer because of a history of smoking.  A yearly low-dose CT scan of the lungs is recommended for people who:  Currently smoke.  Have quit within the past 15 years.  Have at least a 30-pack-year history of smoking. A pack year is smoking an average of one pack of cigarettes a day for 1 year.  Yearly screening should continue until it has been 15 years since you quit.  Yearly screening should stop if you develop a health problem that would prevent you from having lung cancer treatment.  Breast Cancer  Practice breast self-awareness. This means understanding how your breasts normally appear and feel.  It also means doing regular breast self-exams. Let your health care provider know about any changes, no matter how small.  If you are in your 20s or 30s, you should have a clinical breast exam (CBE) by a health care provider every 1-3 years as part of a regular health  exam.  If you are 40 or older, have a CBE every year. Also consider having a breast X-ray (mammogram) every year.  If you have a family history of breast cancer, talk to your health care provider about genetic screening.  If you are at high risk for breast cancer, talk to your health care provider about having an MRI and a mammogram every year.  Breast cancer gene (BRCA) assessment is recommended for women who have family members with BRCA-related cancers. BRCA-related cancers include:  Breast.  Ovarian.  Tubal.  Peritoneal cancers.  Results of the assessment  will determine the need for genetic counseling and BRCA1 and BRCA2 testing. Cervical Cancer Your health care provider may recommend that you be screened regularly for cancer of the pelvic organs (ovaries, uterus, and vagina). This screening involves a pelvic examination, including checking for microscopic changes to the surface of your cervix (Pap test). You may be encouraged to have this screening done every 3 years, beginning at age 20.  For women ages 31-65, health care providers may recommend pelvic exams and Pap testing every 3 years, or they may recommend the Pap and pelvic exam, combined with testing for human papilloma virus (HPV), every 5 years. Some types of HPV increase your risk of cervical cancer. Testing for HPV may also be done on women of any age with unclear Pap test results.  Other health care providers may not recommend any screening for nonpregnant women who are considered low risk for pelvic cancer and who do not have symptoms. Ask your health care provider if a screening pelvic exam is right for you.  If you have had past treatment for cervical cancer or a condition that could lead to cancer, you need Pap tests and screening for cancer for at least 20 years after your treatment. If Pap tests have been discontinued, your risk factors (such as having a new sexual partner) need to be reassessed to determine if screening should resume. Some women have medical problems that increase the chance of getting cervical cancer. In these cases, your health care provider may recommend more frequent screening and Pap tests. Colorectal Cancer  This type of cancer can be detected and often prevented.  Routine colorectal cancer screening usually begins at 56 years of age and continues through 56 years of age.  Your health care provider may recommend screening at an earlier age if you have risk factors for colon cancer.  Your health care provider may also recommend using home test kits to check  for hidden blood in the stool.  A small camera at the end of a tube can be used to examine your colon directly (sigmoidoscopy or colonoscopy). This is done to check for the earliest forms of colorectal cancer.  Routine screening usually begins at age 15.  Direct examination of the colon should be repeated every 5-10 years through 56 years of age. However, you may need to be screened more often if early forms of precancerous polyps or small growths are found. Skin Cancer  Check your skin from head to toe regularly.  Tell your health care provider about any new moles or changes in moles, especially if there is a change in a mole's shape or color.  Also tell your health care provider if you have a mole that is larger than the size of a pencil eraser.  Always use sunscreen. Apply sunscreen liberally and repeatedly throughout the day.  Protect yourself by wearing long sleeves, pants, a wide-brimmed hat, and sunglasses  whenever you are outside. HEART DISEASE, DIABETES, AND HIGH BLOOD PRESSURE   High blood pressure causes heart disease and increases the risk of stroke. High blood pressure is more likely to develop in:  People who have blood pressure in the high end of the normal range (130-139/85-89 mm Hg).  People who are overweight or obese.  People who are African American.  If you are 24-48 years of age, have your blood pressure checked every 3-5 years. If you are 77 years of age or older, have your blood pressure checked every year. You should have your blood pressure measured twice--once when you are at a hospital or clinic, and once when you are not at a hospital or clinic. Record the average of the two measurements. To check your blood pressure when you are not at a hospital or clinic, you can use:  An automated blood pressure machine at a pharmacy.  A home blood pressure monitor.  If you are between 23 years and 63 years old, ask your health care provider if you should take  aspirin to prevent strokes.  Have regular diabetes screenings. This involves taking a blood sample to check your fasting blood sugar level.  If you are at a normal weight and have a low risk for diabetes, have this test once every three years after 56 years of age.  If you are overweight and have a high risk for diabetes, consider being tested at a younger age or more often. PREVENTING INFECTION  Hepatitis B  If you have a higher risk for hepatitis B, you should be screened for this virus. You are considered at high risk for hepatitis B if:  You were born in a country where hepatitis B is common. Ask your health care provider which countries are considered high risk.  Your parents were born in a high-risk country, and you have not been immunized against hepatitis B (hepatitis B vaccine).  You have HIV or AIDS.  You use needles to inject street drugs.  You live with someone who has hepatitis B.  You have had sex with someone who has hepatitis B.  You get hemodialysis treatment.  You take certain medicines for conditions, including cancer, organ transplantation, and autoimmune conditions. Hepatitis C  Blood testing is recommended for:  Everyone born from [birth year] through 1967.  Anyone with known risk factors for hepatitis C. Sexually transmitted infections (STIs)  You should be screened for sexually transmitted infections (STIs) including gonorrhea and chlamydia if:  You are sexually active and are younger than 56 years of age.  You are older than 56 years of age and your health care provider tells you that you are at risk for this type of infection.  Your sexual activity has changed since you were last screened and you are at an increased risk for chlamydia or gonorrhea. Ask your health care provider if you are at risk.  If you do not have HIV, but are at risk, it may be recommended that you take a prescription medicine daily to prevent HIV infection. This is called  pre-exposure prophylaxis (PrEP). You are considered at risk if:  You are sexually active and do not regularly use condoms or know the HIV status of your partner(s).  You take drugs by injection.  You are sexually active with a partner who has HIV. Talk with your health care provider about whether you are at high risk of being infected with HIV. If you choose to begin PrEP, you should first be  tested for HIV. You should then be tested every 3 months for as long as you are taking PrEP.  PREGNANCY   If you are premenopausal and you may become pregnant, ask your health care provider about preconception counseling.  If you may become pregnant, take 400 to 800 micrograms (mcg) of folic acid every day.  If you want to prevent pregnancy, talk to your health care provider about birth control (contraception). OSTEOPOROSIS AND MENOPAUSE   Osteoporosis is a disease in which the bones lose minerals and strength with aging. This can result in serious bone fractures. Your risk for osteoporosis can be identified using a bone density scan.  If you are 14 years of age or older, or if you are at risk for osteoporosis and fractures, ask your health care provider if you should be screened.  Ask your health care provider whether you should take a calcium or vitamin D supplement to lower your risk for osteoporosis.  Menopause may have certain physical symptoms and risks.  Hormone replacement therapy may reduce some of these symptoms and risks. Talk to your health care provider about whether hormone replacement therapy is right for you.  HOME CARE INSTRUCTIONS   Schedule regular health, dental, and eye exams.  Stay current with your immunizations.   Do not use any tobacco products including cigarettes, chewing tobacco, or electronic cigarettes.  If you are pregnant, do not drink alcohol.  If you are breastfeeding, limit how much and how often you drink alcohol.  Limit alcohol intake to no more than 1  drink per day for nonpregnant women. One drink equals 12 ounces of beer, 5 ounces of wine, or 1 ounces of hard liquor.  Do not use street drugs.  Do not share needles.  Ask your health care provider for help if you need support or information about quitting drugs.  Tell your health care provider if you often feel depressed.  Tell your health care provider if you have ever been abused or do not feel safe at home.   This information is not intended to replace advice given to you by your health care provider. Make sure you discuss any questions you have with your health care provider.   Document Released: 07/10/2010 Document Revised: 01/15/2014 Document Reviewed: 11/26/2012 Elsevier Interactive Patient Education [employer].

## 2014-11-09 NOTE — Progress Notes (Signed)
Pre visit review using our clinic review tool, if applicable. No additional management support is needed unless otherwise documented below in the visit note. 

## 2014-11-09 NOTE — Assessment & Plan Note (Signed)
Checking lipid panel, on pravachol 20 mg daily, no side effects adjust as needed.

## 2014-11-09 NOTE — Assessment & Plan Note (Signed)
BP controlled on atenolol 50 mg daily. Checking CMP and adjust as needed.

## 2014-11-09 NOTE — Assessment & Plan Note (Signed)
Checking labs, talked to her about the need for exercise. Shots and colonoscopy, mammogram up to date. She declines HIV and hepatitis C screening today. Counseled on sun safety and exercise for her health.

## 2014-11-09 NOTE — Progress Notes (Signed)
   Subjective:    Patient ID: [name], female    DOB: 03/16/1960, 54 y.o.   MRN: [id number]  HPI The patient is a 54 YO female coming in for wellness. No new concerns, her finger healed up fine. Having more trouble with knee pain and seeing orthopedic for that.   PMH, [facility], social history reviewed and updated.   Review of Systems  Constitutional: Negative for fever, activity change, appetite change, fatigue and unexpected weight change.  HENT: Negative.   Eyes: Negative.   Respiratory: Negative for cough, chest tightness, shortness of breath and wheezing.   Cardiovascular: Negative for chest pain, palpitations and leg swelling.  Gastrointestinal: Negative for abdominal pain, diarrhea, constipation, blood in stool and abdominal distention.  Musculoskeletal: Negative.   Skin: Negative.   Neurological: Negative.   Psychiatric/Behavioral: Negative.       Objective:   Physical Exam  Constitutional: She is oriented to person, place, and time. She appears well-developed and well-nourished.  HENT:  Head: Normocephalic and atraumatic.  Eyes: EOM are normal.  Neck: Normal range of motion.  Cardiovascular: Normal rate and regular rhythm.   Pulmonary/Chest: Effort normal and breath sounds normal. No respiratory distress. She has no wheezes. She has no rales.  Abdominal: Soft. Bowel sounds are normal. She exhibits no distension. There is no tenderness. There is no rebound.  Musculoskeletal: Normal range of motion.  Neurological: She is alert and oriented to person, place, and time. Coordination normal.  Skin: Skin is warm and dry.   Filed Vitals:   11/09/14 1020  BP: 112/78  Pulse: 82  Temp: 98.4 F (36.9 C)  TempSrc: Oral  Resp: 14  Height: 5\' 8"  (1.727 m)  Weight: 227 lb (102.967 kg)  SpO2: 98%      Assessment & Plan:

## 2014-11-10 ENCOUNTER — Other Ambulatory Visit (INDEPENDENT_AMBULATORY_CARE_PROVIDER_SITE_OTHER): Payer: 59

## 2014-11-10 DIAGNOSIS — Z0000 Encounter for general adult medical examination without abnormal findings: Secondary | ICD-10-CM

## 2014-11-10 LAB — COMPREHENSIVE METABOLIC PANEL
ALK PHOS: 79 U/L (ref 39–117)
ALT: 42 U/L — ABNORMAL HIGH (ref 0–35)
AST: 16 U/L (ref 0–37)
Albumin: 4 g/dL (ref 3.5–5.2)
BUN: 14 mg/dL (ref 6–23)
CO2: 27 meq/L (ref 19–32)
Calcium: 9.5 mg/dL (ref 8.4–10.5)
Chloride: 108 mEq/L (ref 96–112)
Creatinine, Ser: 0.89 mg/dL (ref 0.40–1.20)
GFR: 84.42 mL/min (ref >60.00)
GLUCOSE: 110 mg/dL — AB (ref 70–99)
POTASSIUM: 4.1 meq/L (ref 3.5–5.1)
SODIUM: 143 meq/L (ref 135–145)
TOTAL PROTEIN: 6.8 g/dL (ref 6.0–8.3)
Total Bilirubin: 0.6 mg/dL (ref 0.2–1.2)

## 2014-11-10 LAB — LIPID PANEL
CHOL/HDL RATIO: 4
Cholesterol: 212 mg/dL — ABNORMAL HIGH (ref 0–200)
HDL: 58.8 mg/dL (ref >39.00)
LDL CALC: 135 mg/dL — AB (ref 0–99)
NONHDL: 153.12
Triglycerides: 91 mg/dL (ref 0.0–149.0)
VLDL: 18.2 mg/dL (ref 0.0–40.0)

## 2014-11-10 LAB — HEMOGLOBIN A1C: HEMOGLOBIN A1C: 5.9 % (ref 4.6–6.5)

## 2014-12-06 ENCOUNTER — Other Ambulatory Visit: Payer: Self-pay | Admitting: Internal Medicine

## 2015-01-12 MED FILL — TOLTERODINE TART ER 4 MG CA: 4 | 30 days supply | Qty: 30 | Fill #3

## 2015-01-12 MED FILL — ESOMEPRAZOLE MAG DR 40 MG C: 40 | 90 days supply | Qty: 90 | Fill #3

## 2015-02-12 ENCOUNTER — Encounter: Payer: 59 | Attending: Internal Medicine | Admitting: Dietician

## 2015-02-12 DIAGNOSIS — Z713 Dietary counseling and surveillance: Secondary | ICD-10-CM | POA: Diagnosis not present

## 2015-02-12 NOTE — Progress Notes (Deleted)
Subjective:     Patient ID: [name], female   DOB: 1960/02/09, 55 y.o.   MRN: [id number]  HPI   Review of Systems     Objective:   Physical Exam     Assessment:     ***    Plan:     ***

## 2015-02-12 NOTE — Progress Notes (Signed)
Patient was seen on 02/12/15 for the Weight Loss Class at the [facility]. The following learning objectives were met by the patient during this class:   Describe healthy choices in each food group  Describe portion size of foods  Use plate method for meal planning  Demonstrate how to read Nutrition Facts food label  Set realistic goals for weight loss, diet changes, and physical activity.   Goals:  1. Make healthy food choices in each food group.  2. Reduce portion size of foods.  3. Increase fruit and vegetable intake.  4. Use plate method for meal planning.  5. Increase physical activity.    Handouts given:   1. Nutrition Strategies for Weight Loss   2. Meal plan/portion card   3. MyPlate Planner   4. Weight Management Recipe Resources   5. Bake, Broil, Grill   

## 2015-02-14 MED FILL — TOLTERODINE TART ER 4 MG CA: 4 | 30 days supply | Qty: 30 | Fill #0

## 2015-03-03 MED FILL — PRAVASTATIN NA 20 MG TAB: 20 | 90 days supply | Qty: 90 | Fill #1

## 2015-03-03 MED FILL — ATENOLOL 50 MG TABLET: 50 | 90 days supply | Qty: 90 | Fill #1

## 2015-03-15 MED FILL — TOLTERODINE TART ER 4 MG CA: 4 | 30 days supply | Qty: 30 | Fill #1

## 2015-04-12 ENCOUNTER — Other Ambulatory Visit: Payer: Self-pay | Admitting: Internal Medicine

## 2015-04-12 MED FILL — ESOMEPRAZOLE MAG DR 40 MG C: 40 | 90 days supply | Qty: 90 | Fill #0

## 2015-04-12 MED FILL — TOLTERODINE TART ER 4 MG CA: 4 | 30 days supply | Qty: 30 | Fill #2

## 2015-04-13 ENCOUNTER — Telehealth: Payer: Self-pay

## 2015-04-13 NOTE — Telephone Encounter (Signed)
Spoke with the pharmacy about her using a coupon for Nexium.  It is not going to work as she can tolerate the generic nexium currently.

## 2015-05-16 MED FILL — TOLTERODINE TART ER 4 MG CA: 4 | 30 days supply | Qty: 30 | Fill #3

## 2015-06-01 MED FILL — PRAVASTATIN NA 20 MG TAB: 20 | 90 days supply | Qty: 90 | Fill #2

## 2015-06-01 MED FILL — ATENOLOL 50 MG TABLET: 50 | 90 days supply | Qty: 90 | Fill #2

## 2015-06-13 MED FILL — TOLTERODINE TART ER 4 MG CA: 4 | 30 days supply | Qty: 30 | Fill #4

## 2015-07-05 ENCOUNTER — Other Ambulatory Visit: Payer: Self-pay | Admitting: Occupational Medicine

## 2015-07-05 ENCOUNTER — Ambulatory Visit: Payer: Self-pay

## 2015-07-05 DIAGNOSIS — M545 Low back pain: Secondary | ICD-10-CM

## 2015-07-05 DIAGNOSIS — M546 Pain in thoracic spine: Secondary | ICD-10-CM

## 2015-07-11 DIAGNOSIS — H524 Presbyopia: Secondary | ICD-10-CM | POA: Diagnosis not present

## 2015-07-11 DIAGNOSIS — H5203 Hypermetropia, bilateral: Secondary | ICD-10-CM | POA: Diagnosis not present

## 2015-07-13 MED FILL — TOLTERODINE TART ER 4 MG CA: 4 | 30 days supply | Qty: 30 | Fill #0

## 2015-07-20 ENCOUNTER — Encounter: Payer: Self-pay | Admitting: Physical Therapy

## 2015-07-20 ENCOUNTER — Ambulatory Visit: Payer: PRIVATE HEALTH INSURANCE | Attending: Family Medicine | Admitting: Physical Therapy

## 2015-07-20 DIAGNOSIS — M545 Low back pain, unspecified: Secondary | ICD-10-CM

## 2015-07-20 DIAGNOSIS — M62830 Muscle spasm of back: Secondary | ICD-10-CM | POA: Insufficient documentation

## 2015-07-20 DIAGNOSIS — M546 Pain in thoracic spine: Secondary | ICD-10-CM | POA: Diagnosis present

## 2015-07-20 DIAGNOSIS — M6281 Muscle weakness (generalized): Secondary | ICD-10-CM | POA: Diagnosis present
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
## 2015-07-20 NOTE — Patient Instructions (Signed)
Access Code: [email or web address]  URL: [email or web address]  Date: 07/20/2015  Prepared by: Selinda Eon   Exercises  Supine Piriformis Stretch with Foot on Ground - 3 reps - 1 sets - 30 hold - 2x daily - 7x weekly  Supine Lower Trunk Rotation - 10 reps - 1 sets - 5 hold - 2x daily - 7x weekly  Supine Chest Stretch with Elbows Bent - 10 reps - 1 sets - 5 hold - 2x daily - 7x weekly  Supine Transversus Abdominis Bracing - Hands on Stomach

## 2015-07-21 ENCOUNTER — Ambulatory Visit: Payer: PRIVATE HEALTH INSURANCE | Admitting: Physical Therapy

## 2015-07-26 ENCOUNTER — Encounter: Payer: Self-pay | Admitting: Physical Therapy

## 2015-07-26 ENCOUNTER — Ambulatory Visit: Payer: PRIVATE HEALTH INSURANCE | Attending: Internal Medicine | Admitting: Physical Therapy

## 2015-07-26 DIAGNOSIS — M545 Low back pain, unspecified: Secondary | ICD-10-CM

## 2015-07-26 DIAGNOSIS — M6281 Muscle weakness (generalized): Secondary | ICD-10-CM | POA: Diagnosis not present

## 2015-07-26 DIAGNOSIS — M546 Pain in thoracic spine: Secondary | ICD-10-CM | POA: Diagnosis not present

## 2015-07-26 DIAGNOSIS — M62830 Muscle spasm of back: Secondary | ICD-10-CM | POA: Diagnosis not present
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
## 2015-08-01 DIAGNOSIS — Z1231 Encounter for screening mammogram for malignant neoplasm of breast: Secondary | ICD-10-CM | POA: Diagnosis not present

## 2015-08-01 DIAGNOSIS — Z1389 Encounter for screening for other disorder: Secondary | ICD-10-CM | POA: Diagnosis not present

## 2015-08-01 DIAGNOSIS — Z01419 Encounter for gynecological examination (general) (routine) without abnormal findings: Secondary | ICD-10-CM | POA: Diagnosis not present

## 2015-08-01 DIAGNOSIS — Z130 Encounter for screening for diseases of the blood and blood-forming organs and certain disorders involving the immune mechanism: Secondary | ICD-10-CM | POA: Diagnosis not present

## 2015-08-01 DIAGNOSIS — Z6835 Body mass index (BMI) 35.0-35.9, adult: Secondary | ICD-10-CM | POA: Diagnosis not present

## 2015-08-09 ENCOUNTER — Encounter: Payer: Self-pay | Admitting: Physical Therapy

## 2015-08-10 ENCOUNTER — Encounter: Payer: Self-pay | Admitting: Physical Therapy

## 2015-08-11 MED FILL — TOLTERODINE TART ER 4 MG CA: 4 | 30 days supply | Qty: 30 | Fill #0

## 2015-08-15 ENCOUNTER — Encounter: Payer: Self-pay | Admitting: Physical Therapy

## 2015-08-17 ENCOUNTER — Encounter: Payer: Self-pay | Admitting: Physical Therapy

## 2015-08-22 ENCOUNTER — Encounter: Payer: Self-pay | Admitting: Physical Therapy

## 2015-08-26 MED FILL — ATENOLOL 25 MG TABLET: 25 | 30 days supply | Qty: 60 | Fill #0

## 2015-08-26 MED FILL — PRAVASTATIN NA 20 MG TAB: 20 | 90 days supply | Qty: 90 | Fill #3

## 2015-09-13 MED FILL — ESOMEPRAZOLE MAG DR 40 MG C: 40 | 90 days supply | Qty: 90 | Fill #1

## 2015-09-14 MED FILL — TOLTERODINE TART ER 4 MG CA: 4 | 30 days supply | Qty: 30 | Fill #0

## 2015-10-13 MED FILL — TOLTERODINE TART ER 4 MG CA: 4 | 30 days supply | Qty: 30 | Fill #0

## 2015-10-28 MED FILL — ATENOLOL 50 MG TABLET: 50 | 30 days supply | Qty: 30 | Fill #0

## 2015-11-11 ENCOUNTER — Other Ambulatory Visit (INDEPENDENT_AMBULATORY_CARE_PROVIDER_SITE_OTHER): Payer: 59

## 2015-11-11 ENCOUNTER — Ambulatory Visit (INDEPENDENT_AMBULATORY_CARE_PROVIDER_SITE_OTHER): Payer: 59 | Admitting: Internal Medicine

## 2015-11-11 ENCOUNTER — Encounter: Payer: Self-pay | Admitting: Internal Medicine

## 2015-11-11 VITALS — BP 110/64 | HR 77 | Temp 98.2°F | Resp 16 | Ht 68.0 in | Wt 222.0 lb

## 2015-11-11 DIAGNOSIS — Z6833 Body mass index (BMI) 33.0-33.9, adult: Secondary | ICD-10-CM

## 2015-11-11 DIAGNOSIS — Z0000 Encounter for general adult medical examination without abnormal findings: Secondary | ICD-10-CM

## 2015-11-11 DIAGNOSIS — I10 Essential (primary) hypertension: Secondary | ICD-10-CM | POA: Diagnosis not present

## 2015-11-11 DIAGNOSIS — E669 Obesity, unspecified: Secondary | ICD-10-CM

## 2015-11-11 DIAGNOSIS — E785 Hyperlipidemia, unspecified: Secondary | ICD-10-CM | POA: Diagnosis not present

## 2015-11-11 DIAGNOSIS — K219 Gastro-esophageal reflux disease without esophagitis: Secondary | ICD-10-CM

## 2015-11-11 LAB — LIPID PANEL
CHOL/HDL RATIO: 3
Cholesterol: 197 mg/dL (ref 0–200)
HDL: 61 mg/dL (ref >39.00)
LDL CALC: 121 mg/dL — AB (ref 0–99)
NONHDL: 135.74
Triglycerides: 72 mg/dL (ref 0.0–149.0)
VLDL: 14.4 mg/dL (ref 0.0–40.0)

## 2015-11-11 LAB — CBC
HEMATOCRIT: 40.8 % (ref 36.0–46.0)
Hemoglobin: 13.5 g/dL (ref 12.0–15.0)
MCHC: 33 g/dL (ref 30.0–36.0)
MCV: 76.5 fl — AB (ref 78.0–100.0)
PLATELETS: 345 10*3/uL (ref 150.0–400.0)
RBC: 5.33 Mil/uL — AB (ref 3.87–5.11)
RDW: 14.5 % (ref 11.5–15.5)
WBC: 5.1 10*3/uL (ref 4.0–10.5)

## 2015-11-11 LAB — COMPREHENSIVE METABOLIC PANEL
ALBUMIN: 4.4 g/dL (ref 3.5–5.2)
ALT: 32 U/L (ref 0–35)
AST: 20 U/L (ref 0–37)
Alkaline Phosphatase: 86 U/L (ref 39–117)
BUN: 13 mg/dL (ref 6–23)
CALCIUM: 9.5 mg/dL (ref 8.4–10.5)
CHLORIDE: 107 meq/L (ref 96–112)
CO2: 30 meq/L (ref 19–32)
CREATININE: 0.94 mg/dL (ref 0.40–1.20)
GFR: 78.97 mL/min (ref >60.00)
Glucose, Bld: 95 mg/dL (ref 70–99)
Potassium: 4.1 mEq/L (ref 3.5–5.1)
Sodium: 143 mEq/L (ref 135–145)
Total Bilirubin: 0.7 mg/dL (ref 0.2–1.2)
Total Protein: 6.8 g/dL (ref 6.0–8.3)

## 2015-11-11 LAB — HEMOGLOBIN A1C: HEMOGLOBIN A1C: 5.6 % (ref 4.6–6.5)

## 2015-11-11 NOTE — Assessment & Plan Note (Signed)
Pap smear and mammogram up to date from gyn. Checking labs today. Flu and tdap up to date. Declines need for hep c or hiv screening. Counseled about sun safety and dangers of distracted driving. Given screening recommendations. EKG without changes done today.

## 2015-11-11 NOTE — Assessment & Plan Note (Signed)
Doing well on nexium daily.

## 2015-11-11 NOTE — Progress Notes (Signed)
Pre visit review using our clinic review tool, if applicable. No additional management support is needed unless otherwise documented below in the visit note. 

## 2015-11-11 NOTE — Assessment & Plan Note (Signed)
Checking lipid panel, pravastatin 20 mg daily no side effects.

## 2015-11-11 NOTE — Assessment & Plan Note (Signed)
Weight down 5 pounds since last year with more walking. Encouraged her to continue with small changes.

## 2015-11-11 NOTE — Progress Notes (Signed)
   Subjective:    Patient ID: [name], female    DOB: 29-May-1960, 55 y.o.   MRN: [id number]  HPI The patient is a 55 YO female coming in for wellness. No concerns.   PMH, [facility], social history reviewed and updated.   Review of Systems  Constitutional: Negative.   HENT: Negative.   Eyes: Negative.   Respiratory: Negative for cough, chest tightness and shortness of breath.   Cardiovascular: Negative for chest pain, palpitations and leg swelling.  Gastrointestinal: Negative for abdominal distention, abdominal pain, constipation, diarrhea, nausea and vomiting.  Musculoskeletal: Negative.   Skin: Negative.   Neurological: Negative.   Psychiatric/Behavioral: Negative.       Objective:   Physical Exam  Constitutional: She is oriented to person, place, and time. She appears well-developed and well-nourished.  HENT:  Head: Normocephalic and atraumatic.  Eyes: EOM are normal.  Neck: Normal range of motion.  Cardiovascular: Normal rate and regular rhythm.   Carotids without bruit  Pulmonary/Chest: Effort normal and breath sounds normal. No respiratory distress. She has no wheezes. She has no rales.  Abdominal: Soft. Bowel sounds are normal. She exhibits no distension. There is no tenderness. There is no rebound.  Musculoskeletal: She exhibits no edema.  Neurological: She is alert and oriented to person, place, and time. Coordination normal.  Skin: Skin is warm and dry.  Psychiatric: She has a normal mood and affect.   Vitals:   11/11/15 0823  BP: 110/64  Pulse: 77  Resp: 16  Temp: 98.2 F (36.8 C)  TempSrc: Oral  SpO2: 98%  Weight: 222 lb (100.7 kg)  Height: 5\' 8"  (1.727 m)   EKG: Rate 70, axis normal, intervals normal, sinus, no st or t wave changes.     Assessment & Plan:

## 2015-11-11 NOTE — Patient Instructions (Signed)
We have done the EKG which is not different from before.   We are checking the labs and will send the results on mychart.   Keep up the good work with walking daily as you are down 5 pounds since last year!  Health Maintenance, Female Adopting a healthy lifestyle and getting preventive care can go a long way to promote health and wellness. Talk with your health care provider about what schedule of regular examinations is right for you. This is a good chance for you to check in with your provider about disease prevention and staying healthy. In between checkups, there are plenty of things you can do on your own. Experts have done a lot of research about which lifestyle changes and preventive measures are most likely to keep you healthy. Ask your health care provider for more information. WEIGHT AND DIET  Eat a healthy diet  Be sure to include plenty of vegetables, fruits, low-fat dairy products, and lean protein.  Do not eat a lot of foods high in solid fats, added sugars, or salt.  Get regular exercise. This is one of the most important things you can do for your health.  Most adults should exercise for at least 150 minutes each week. The exercise should increase your heart rate and make you sweat (moderate-intensity exercise).  Most adults should also do strengthening exercises at least twice a week. This is in addition to the moderate-intensity exercise.  Maintain a healthy weight  Body mass index (BMI) is a measurement that can be used to identify possible weight problems. It estimates body fat based on height and weight. Your health care provider can help determine your BMI and help you achieve or maintain a healthy weight.  For females 77 years of age and older:   A BMI below 18.5 is considered underweight.  A BMI of 18.5 to 24.9 is normal.  A BMI of 25 to 29.9 is considered overweight.  A BMI of 30 and above is considered obese.  Watch levels of cholesterol and blood  lipids  You should start having your blood tested for lipids and cholesterol at 57 years of age, then have this test every 5 years.  You may need to have your cholesterol levels checked more often if:  Your lipid or cholesterol levels are high.  You are older than 57 years of age.  You are at high risk for heart disease.  CANCER SCREENING   Lung Cancer  Lung cancer screening is recommended for adults 69-64 years old who are at high risk for lung cancer because of a history of smoking.  A yearly low-dose CT scan of the lungs is recommended for people who:  Currently smoke.  Have quit within the past 15 years.  Have at least a 30-pack-year history of smoking. A pack year is smoking an average of one pack of cigarettes a day for 1 year.  Yearly screening should continue until it has been 15 years since you quit.  Yearly screening should stop if you develop a health problem that would prevent you from having lung cancer treatment.  Breast Cancer  Practice breast self-awareness. This means understanding how your breasts normally appear and feel.  It also means doing regular breast self-exams. Let your health care provider know about any changes, no matter how small.  If you are in your 20s or 30s, you should have a clinical breast exam (CBE) by a health care provider every 1-3 years as part of a regular  health exam.  If you are 40 or older, have a CBE every year. Also consider having a breast X-ray (mammogram) every year.  If you have a family history of breast cancer, talk to your health care provider about genetic screening.  If you are at high risk for breast cancer, talk to your health care provider about having an MRI and a mammogram every year.  Breast cancer gene (BRCA) assessment is recommended for women who have family members with BRCA-related cancers. BRCA-related cancers include:  Breast.  Ovarian.  Tubal.  Peritoneal cancers.  Results of the assessment  will determine the need for genetic counseling and BRCA1 and BRCA2 testing. Cervical Cancer Your health care provider may recommend that you be screened regularly for cancer of the pelvic organs (ovaries, uterus, and vagina). This screening involves a pelvic examination, including checking for microscopic changes to the surface of your cervix (Pap test). You may be encouraged to have this screening done every 3 years, beginning at age 13.  For women ages 66-65, health care providers may recommend pelvic exams and Pap testing every 3 years, or they may recommend the Pap and pelvic exam, combined with testing for human papilloma virus (HPV), every 5 years. Some types of HPV increase your risk of cervical cancer. Testing for HPV may also be done on women of any age with unclear Pap test results.  Other health care providers may not recommend any screening for nonpregnant women who are considered low risk for pelvic cancer and who do not have symptoms. Ask your health care provider if a screening pelvic exam is right for you.  If you have had past treatment for cervical cancer or a condition that could lead to cancer, you need Pap tests and screening for cancer for at least 20 years after your treatment. If Pap tests have been discontinued, your risk factors (such as having a new sexual partner) need to be reassessed to determine if screening should resume. Some women have medical problems that increase the chance of getting cervical cancer. In these cases, your health care provider may recommend more frequent screening and Pap tests. Colorectal Cancer  This type of cancer can be detected and often prevented.  Routine colorectal cancer screening usually begins at 57 years of age and continues through 57 years of age.  Your health care provider may recommend screening at an earlier age if you have risk factors for colon cancer.  Your health care provider may also recommend using home test kits to check  for hidden blood in the stool.  A small camera at the end of a tube can be used to examine your colon directly (sigmoidoscopy or colonoscopy). This is done to check for the earliest forms of colorectal cancer.  Routine screening usually begins at age 72.  Direct examination of the colon should be repeated every 5-10 years through 57 years of age. However, you may need to be screened more often if early forms of precancerous polyps or small growths are found. Skin Cancer  Check your skin from head to toe regularly.  Tell your health care provider about any new moles or changes in moles, especially if there is a change in a mole's shape or color.  Also tell your health care provider if you have a mole that is larger than the size of a pencil eraser.  Always use sunscreen. Apply sunscreen liberally and repeatedly throughout the day.  Protect yourself by wearing long sleeves, pants, a wide-brimmed hat, and  sunglasses whenever you are outside. HEART DISEASE, DIABETES, AND HIGH BLOOD PRESSURE   High blood pressure causes heart disease and increases the risk of stroke. High blood pressure is more likely to develop in:  People who have blood pressure in the high end of the normal range (130-139/85-89 mm Hg).  People who are overweight or obese.  People who are African American.  If you are 13-40 years of age, have your blood pressure checked every 3-5 years. If you are 65 years of age or older, have your blood pressure checked every year. You should have your blood pressure measured twice--once when you are at a hospital or clinic, and once when you are not at a hospital or clinic. Record the average of the two measurements. To check your blood pressure when you are not at a hospital or clinic, you can use:  An automated blood pressure machine at a pharmacy.  A home blood pressure monitor.  If you are between 55 years and 85 years old, ask your health care provider if you should take  aspirin to prevent strokes.  Have regular diabetes screenings. This involves taking a blood sample to check your fasting blood sugar level.  If you are at a normal weight and have a low risk for diabetes, have this test once every three years after 57 years of age.  If you are overweight and have a high risk for diabetes, consider being tested at a younger age or more often. PREVENTING INFECTION  Hepatitis B  If you have a higher risk for hepatitis B, you should be screened for this virus. You are considered at high risk for hepatitis B if:  You were born in a country where hepatitis B is common. Ask your health care provider which countries are considered high risk.  Your parents were born in a high-risk country, and you have not been immunized against hepatitis B (hepatitis B vaccine).  You have HIV or AIDS.  You use needles to inject street drugs.  You live with someone who has hepatitis B.  You have had sex with someone who has hepatitis B.  You get hemodialysis treatment.  You take certain medicines for conditions, including cancer, organ transplantation, and autoimmune conditions. Hepatitis C  Blood testing is recommended for:  Everyone born from [birth year] through 1967.  Anyone with known risk factors for hepatitis C. Sexually transmitted infections (STIs)  You should be screened for sexually transmitted infections (STIs) including gonorrhea and chlamydia if:  You are sexually active and are younger than 57 years of age.  You are older than 57 years of age and your health care provider tells you that you are at risk for this type of infection.  Your sexual activity has changed since you were last screened and you are at an increased risk for chlamydia or gonorrhea. Ask your health care provider if you are at risk.  If you do not have HIV, but are at risk, it may be recommended that you take a prescription medicine daily to prevent HIV infection. This is called  pre-exposure prophylaxis (PrEP). You are considered at risk if:  You are sexually active and do not regularly use condoms or know the HIV status of your partner(s).  You take drugs by injection.  You are sexually active with a partner who has HIV. Talk with your health care provider about whether you are at high risk of being infected with HIV. If you choose to begin PrEP, you should first  be tested for HIV. You should then be tested every 3 months for as long as you are taking PrEP.  PREGNANCY   If you are premenopausal and you may become pregnant, ask your health care provider about preconception counseling.  If you may become pregnant, take 400 to 800 micrograms (mcg) of folic acid every day.  If you want to prevent pregnancy, talk to your health care provider about birth control (contraception). OSTEOPOROSIS AND MENOPAUSE   Osteoporosis is a disease in which the bones lose minerals and strength with aging. This can result in serious bone fractures. Your risk for osteoporosis can be identified using a bone density scan.  If you are 42 years of age or older, or if you are at risk for osteoporosis and fractures, ask your health care provider if you should be screened.  Ask your health care provider whether you should take a calcium or vitamin D supplement to lower your risk for osteoporosis.  Menopause may have certain physical symptoms and risks.  Hormone replacement therapy may reduce some of these symptoms and risks. Talk to your health care provider about whether hormone replacement therapy is right for you.  HOME CARE INSTRUCTIONS   Schedule regular health, dental, and eye exams.  Stay current with your immunizations.   Do not use any tobacco products including cigarettes, chewing tobacco, or electronic cigarettes.  If you are pregnant, do not drink alcohol.  If you are breastfeeding, limit how much and how often you drink alcohol.  Limit alcohol intake to no more than 1  drink per day for nonpregnant women. One drink equals 12 ounces of beer, 5 ounces of wine, or 1 ounces of hard liquor.  Do not use street drugs.  Do not share needles.  Ask your health care provider for help if you need support or information about quitting drugs.  Tell your health care provider if you often feel depressed.  Tell your health care provider if you have ever been abused or do not feel safe at home.   This information is not intended to replace advice given to you by your health care provider. Make sure you discuss any questions you have with your health care provider.   Document Released: 07/10/2010 Document Revised: 01/15/2014 Document Reviewed: 11/26/2012 Elsevier Interactive Patient Education [employer].

## 2015-11-11 NOTE — Assessment & Plan Note (Signed)
BP at goal on atenolol 50 mg daily.

## 2015-11-14 MED FILL — TOLTERODINE TART ER 4 MG CA: 4 | 30 days supply | Qty: 30 | Fill #0

## 2015-11-28 MED FILL — ATENOLOL 50 MG TABLET: 50 | 30 days supply | Qty: 30 | Fill #1

## 2015-11-29 ENCOUNTER — Other Ambulatory Visit: Payer: Self-pay | Admitting: Internal Medicine

## 2015-11-29 MED FILL — PRAVASTATIN NA 20 MG TAB: 20 | 90 days supply | Qty: 90 | Fill #0

## 2015-12-16 ENCOUNTER — Encounter: Payer: Self-pay | Admitting: Internal Medicine

## 2015-12-16 ENCOUNTER — Other Ambulatory Visit: Payer: Self-pay | Admitting: Internal Medicine

## 2015-12-16 MED ORDER — PANTOPRAZOLE SODIUM 40 MG PO TBEC: 40.0000 mg | DELAYED_RELEASE_TABLET | Freq: Every day | ORAL | 3 refills | Status: DC

## 2015-12-16 MED FILL — PANTOPRAZOLE SOD DR 40 MG T: 40 | 30 days supply | Qty: 30 | Fill #0

## 2015-12-16 MED FILL — TOLTERODINE TART ER 4 MG CA: 4 | 30 days supply | Qty: 30 | Fill #0

## 2015-12-19 MED FILL — ATENOLOL 50 MG TABLET: 50 | 30 days supply | Qty: 30 | Fill #0

## 2016-01-16 MED FILL — PANTOPRAZOLE SOD DR 40 MG T: 40 | 30 days supply | Qty: 30 | Fill #1

## 2016-01-16 MED FILL — TOLTERODINE TART ER 4 MG CA: 4 | 30 days supply | Qty: 30 | Fill #0

## 2016-01-30 ENCOUNTER — Other Ambulatory Visit: Payer: Self-pay | Admitting: Internal Medicine

## 2016-01-30 MED FILL — ATENOLOL 50 MG TABLET: 50 | 30 days supply | Qty: 30 | Fill #0

## 2016-01-31 ENCOUNTER — Other Ambulatory Visit: Payer: Self-pay | Admitting: Internal Medicine

## 2016-02-14 MED FILL — PANTOPRAZOLE SOD DR 40 MG T: 40 | 30 days supply | Qty: 30 | Fill #2

## 2016-02-15 MED FILL — TOLTERODINE TART ER 4 MG CA: 4 | 30 days supply | Qty: 30 | Fill #0

## 2016-02-27 ENCOUNTER — Other Ambulatory Visit: Payer: Self-pay | Admitting: Internal Medicine

## 2016-02-27 MED FILL — PRAVASTATIN NA 20 MG TAB: 20 | 90 days supply | Qty: 90 | Fill #1

## 2016-02-27 MED FILL — ATENOLOL 50 MG TAB: 50 | 30 days supply | Qty: 30 | Fill #0

## 2016-03-17 MED FILL — PANTOPRAZOLE SOD DR 40 MG T: 40 | 30 days supply | Qty: 30 | Fill #3

## 2016-03-19 MED FILL — TOLTERODINE TART ER 4 MG CA: 4 | 30 days supply | Qty: 30 | Fill #0

## 2016-03-26 ENCOUNTER — Other Ambulatory Visit: Payer: Self-pay | Admitting: Internal Medicine

## 2016-03-26 MED FILL — ATENOLOL 50 MG TABLET: 50 | 30 days supply | Qty: 30 | Fill #0

## 2016-04-13 ENCOUNTER — Other Ambulatory Visit: Payer: Self-pay | Admitting: Internal Medicine

## 2016-04-13 MED FILL — TOLTERODINE TART ER 4 MG CA: 4 | 30 days supply | Qty: 30 | Fill #0

## 2016-04-13 MED FILL — PANTOPRAZOLE SOD DR 40 MG T: 40 | 90 days supply | Qty: 90 | Fill #0

## 2016-04-25 MED FILL — ATENOLOL 50 MG TABLET: 50 | 90 days supply | Qty: 90 | Fill #1

## 2016-05-13 MED FILL — TOLTERODINE TART ER 4 MG CA: 4 | 30 days supply | Qty: 30 | Fill #1

## 2016-05-23 ENCOUNTER — Encounter: Payer: 59 | Attending: Internal Medicine | Admitting: Registered"

## 2016-05-23 DIAGNOSIS — Z713 Dietary counseling and surveillance: Secondary | ICD-10-CM | POA: Diagnosis not present

## 2016-05-25 ENCOUNTER — Encounter: Payer: Self-pay | Admitting: Registered"

## 2016-05-25 NOTE — Progress Notes (Signed)
Patient was seen on 05/23/2016 for the Weight Loss Class at the [facility]. The following learning objectives were met by the patient during this class:   Describe healthy choices in each food group  Describe portion size of foods  Use plate method for meal planning  Demonstrate how to read Nutrition Facts food label  Set realistic goals for weight loss, diet changes, and physical activity.   Goals:  1. Make healthy food choices in each food group.  2. Reduce portion size of foods.  3. Increase fruit and vegetable intake.  4. Use plate method for meal planning.  5. Increase physical activity.    Handouts given:   1. PPT slides  2. Meal plan/portion card   3. MyPlate Planner   4. Weight Management Recipe Resources   

## 2016-05-28 MED FILL — PRAVASTATIN NA 20 MG TAB: 20 | 90 days supply | Qty: 90 | Fill #2

## 2016-06-14 MED FILL — TOLTERODINE TART ER 4 MG CA: 4 | 30 days supply | Qty: 30 | Fill #2

## 2016-07-17 ENCOUNTER — Other Ambulatory Visit: Payer: Self-pay | Admitting: Internal Medicine

## 2016-07-17 MED FILL — ATENOLOL 50 MG TABLET: 50 | 30 days supply | Qty: 30 | Fill #0

## 2016-07-17 MED FILL — TOLTERODINE TART ER 4 MG CA: 4 | 30 days supply | Qty: 30 | Fill #3

## 2016-07-17 MED FILL — PANTOPRAZOLE SOD DR 40 MG T: 40 | 30 days supply | Qty: 30 | Fill #1

## 2016-08-16 ENCOUNTER — Other Ambulatory Visit: Payer: Self-pay | Admitting: Internal Medicine

## 2016-08-16 MED FILL — TOLTERODINE TART ER 4 MG CA: 4 | 30 days supply | Qty: 30 | Fill #0

## 2016-08-17 MED FILL — PANTOPRAZOLE SOD DR 40 MG T: 40 | 90 days supply | Qty: 90 | Fill #0

## 2016-08-27 MED FILL — PRAVASTATIN NA 20 MG TAB: 20 | 90 days supply | Qty: 90 | Fill #3

## 2016-08-27 MED FILL — ATENOLOL 50 MG TABLET: 50 | 30 days supply | Qty: 30 | Fill #1

## 2016-08-30 DIAGNOSIS — H524 Presbyopia: Secondary | ICD-10-CM | POA: Diagnosis not present

## 2016-08-30 DIAGNOSIS — H5203 Hypermetropia, bilateral: Secondary | ICD-10-CM | POA: Diagnosis not present

## 2016-09-17 MED FILL — TOLTERODINE TART ER 4 MG CA: 4 | 30 days supply | Qty: 30 | Fill #1

## 2016-09-25 MED FILL — ATENOLOL 50 MG TABLET: 50 | 30 days supply | Qty: 30 | Fill #2

## 2016-10-22 MED FILL — TOLTERODINE TART ER 4 MG CA: 4 | 30 days supply | Qty: 30 | Fill #0

## 2016-10-23 DIAGNOSIS — Z130 Encounter for screening for diseases of the blood and blood-forming organs and certain disorders involving the immune mechanism: Secondary | ICD-10-CM | POA: Diagnosis not present

## 2016-10-23 DIAGNOSIS — N3281 Overactive bladder: Secondary | ICD-10-CM | POA: Diagnosis not present

## 2016-10-23 DIAGNOSIS — Z01419 Encounter for gynecological examination (general) (routine) without abnormal findings: Secondary | ICD-10-CM | POA: Diagnosis not present

## 2016-10-23 DIAGNOSIS — Z1389 Encounter for screening for other disorder: Secondary | ICD-10-CM | POA: Diagnosis not present

## 2016-10-23 DIAGNOSIS — Z1231 Encounter for screening mammogram for malignant neoplasm of breast: Secondary | ICD-10-CM | POA: Diagnosis not present

## 2016-10-23 LAB — HM MAMMOGRAPHY

## 2016-10-24 MED FILL — ATENOLOL 50 MG TABLET: 50 | 30 days supply | Qty: 30 | Fill #3

## 2016-11-06 ENCOUNTER — Encounter: Payer: Self-pay | Admitting: Internal Medicine

## 2016-11-06 ENCOUNTER — Ambulatory Visit (INDEPENDENT_AMBULATORY_CARE_PROVIDER_SITE_OTHER): Payer: 59 | Admitting: Internal Medicine

## 2016-11-06 ENCOUNTER — Ambulatory Visit (INDEPENDENT_AMBULATORY_CARE_PROVIDER_SITE_OTHER)
Admission: RE | Admit: 2016-11-06 | Discharge: 2016-11-06 | Disposition: A | Discharge status: Home / Self Care | Payer: 59 | Source: Ambulatory Visit | Attending: Internal Medicine | Admitting: Internal Medicine

## 2016-11-06 VITALS — BP 136/86 | HR 76 | Temp 98.6°F | Resp 16 | Wt 224.0 lb

## 2016-11-06 DIAGNOSIS — M79671 Pain in right foot: Secondary | ICD-10-CM | POA: Diagnosis not present

## 2016-11-06 DIAGNOSIS — M79674 Pain in right toe(s): Secondary | ICD-10-CM | POA: Diagnosis not present

## 2016-11-06 NOTE — Patient Instructions (Signed)
Have the xray done today.    Wear the shoe.  Continue advil/aleve for the pain.   We will call you with the results.
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
## 2016-11-06 NOTE — Assessment & Plan Note (Signed)
Stubbed toe yesterday Pain in distal right first metatarsal and toe associated with dec ROM and numbness Xray today Fitted for a post op shoe advil prn Follow up depending on xray result and symptoms

## 2016-11-12 ENCOUNTER — Ambulatory Visit (INDEPENDENT_AMBULATORY_CARE_PROVIDER_SITE_OTHER): Payer: 59 | Admitting: Internal Medicine

## 2016-11-12 ENCOUNTER — Telehealth: Payer: Self-pay

## 2016-11-12 ENCOUNTER — Encounter: Payer: Self-pay | Admitting: Internal Medicine

## 2016-11-12 ENCOUNTER — Other Ambulatory Visit (INDEPENDENT_AMBULATORY_CARE_PROVIDER_SITE_OTHER): Payer: 59

## 2016-11-12 VITALS — BP 130/78 | HR 73 | Temp 97.9°F | Ht 68.0 in | Wt 222.0 lb

## 2016-11-12 DIAGNOSIS — Z0000 Encounter for general adult medical examination without abnormal findings: Secondary | ICD-10-CM

## 2016-11-12 DIAGNOSIS — I10 Essential (primary) hypertension: Secondary | ICD-10-CM

## 2016-11-12 DIAGNOSIS — Z1159 Encounter for screening for other viral diseases: Secondary | ICD-10-CM | POA: Diagnosis not present

## 2016-11-12 DIAGNOSIS — K219 Gastro-esophageal reflux disease without esophagitis: Secondary | ICD-10-CM

## 2016-11-12 DIAGNOSIS — E785 Hyperlipidemia, unspecified: Secondary | ICD-10-CM | POA: Diagnosis not present

## 2016-11-12 LAB — CBC
HCT: 41.8 % (ref 36.0–46.0)
Hemoglobin: 13.5 g/dL (ref 12.0–15.0)
MCHC: 32.3 g/dL (ref 30.0–36.0)
MCV: 78.2 fl (ref 78.0–100.0)
PLATELETS: 341 10*3/uL (ref 150.0–400.0)
RBC: 5.34 Mil/uL — AB (ref 3.87–5.11)
RDW: 14.2 % (ref 11.5–15.5)
WBC: 6.1 10*3/uL (ref 4.0–10.5)

## 2016-11-12 LAB — HEMOGLOBIN A1C: HEMOGLOBIN A1C: 5.7 % (ref 4.6–6.5)

## 2016-11-12 LAB — COMPREHENSIVE METABOLIC PANEL
ALBUMIN: 4.3 g/dL (ref 3.5–5.2)
ALK PHOS: 72 U/L (ref 39–117)
ALT: 32 U/L (ref 0–35)
AST: 19 U/L (ref 0–37)
BILIRUBIN TOTAL: 0.9 mg/dL (ref 0.2–1.2)
BUN: 14 mg/dL (ref 6–23)
CO2: 29 mEq/L (ref 19–32)
CREATININE: 0.91 mg/dL (ref 0.40–1.20)
Calcium: 9.7 mg/dL (ref 8.4–10.5)
Chloride: 106 mEq/L (ref 96–112)
GFR: 81.69 mL/min (ref >60.00)
GLUCOSE: 94 mg/dL (ref 70–99)
POTASSIUM: 4.1 meq/L (ref 3.5–5.1)
SODIUM: 142 meq/L (ref 135–145)
TOTAL PROTEIN: 7 g/dL (ref 6.0–8.3)

## 2016-11-12 LAB — LIPID PANEL
CHOLESTEROL: 199 mg/dL (ref 0–200)
HDL: 55.4 mg/dL (ref >39.00)
LDL Cholesterol: 125 mg/dL — ABNORMAL HIGH (ref 0–99)
NONHDL: 143.38
Total CHOL/HDL Ratio: 4
Triglycerides: 93 mg/dL (ref 0.0–149.0)
VLDL: 18.6 mg/dL (ref 0.0–40.0)

## 2016-11-12 NOTE — Progress Notes (Signed)
   Subjective:    Patient ID: [name], female    DOB: 1960-10-19, 56 y.o.   MRN: [id number]  HPI The patient is a 56 YO female coming in for physical. No new concerns. Toe is improving.  PMH, [facility], social history reviewed and updated.   Review of Systems  Constitutional: Negative.   HENT: Negative.   Eyes: Negative.   Respiratory: Negative for cough, chest tightness and shortness of breath.   Cardiovascular: Negative for chest pain, palpitations and leg swelling.  Gastrointestinal: Negative for abdominal distention, abdominal pain, constipation, diarrhea, nausea and vomiting.  Musculoskeletal: Negative.   Skin: Negative.   Neurological: Negative.   Psychiatric/Behavioral: Negative.       Objective:   Physical Exam  Constitutional: She is oriented to person, place, and time. She appears well-developed and well-nourished.  HENT:  Head: Normocephalic and atraumatic.  Eyes: EOM are normal.  Neck: Normal range of motion.  Cardiovascular: Normal rate and regular rhythm.  Pulmonary/Chest: Effort normal and breath sounds normal. No respiratory distress. She has no wheezes. She has no rales.  Abdominal: Soft. Bowel sounds are normal. She exhibits no distension. There is no tenderness. There is no rebound.  Musculoskeletal: She exhibits no edema.  Neurological: She is alert and oriented to person, place, and time. Coordination normal.  Skin: Skin is warm and dry.  Psychiatric: She has a normal mood and affect.    Vitals:   11/12/16 0900  BP: 130/78  Pulse: 73  Temp: 97.9 F (36.6 C)  TempSrc: Oral  SpO2: 99%  Weight: 222 lb (100.7 kg)  Height: 5\' 8"  (1.727 m)      Assessment & Plan:

## 2016-11-12 NOTE — Assessment & Plan Note (Signed)
LDL goal <130. Taking pravastatin 20 mg daily, checking lipid panel and adjust as needed.

## 2016-11-12 NOTE — Telephone Encounter (Signed)
Patient has been added to shingrix waitlist per dr Sharlet Salina

## 2016-11-12 NOTE — Assessment & Plan Note (Signed)
Taking protonix daily and wishes to continue therapy.

## 2016-11-12 NOTE — Assessment & Plan Note (Addendum)
Checking labs including hep c screening. Mammogram and colonoscopy up to date. Pap smear up to date. Counseled about exercise and sun safety and mole surveillance. Given 10 year screening recommendations. Added to shingrix waiting list.

## 2016-11-12 NOTE — Patient Instructions (Signed)
We are checking the labs and will send you the results.   Health Maintenance, Female Adopting a healthy lifestyle and getting preventive care can go a long way to promote health and wellness. Talk with your health care provider about what schedule of regular examinations is right for you. This is a good chance for you to check in with your provider about disease prevention and staying healthy. In between checkups, there are plenty of things you can do on your own. Experts have done a lot of research about which lifestyle changes and preventive measures are most likely to keep you healthy. Ask your health care provider for more information. Weight and diet Eat a healthy diet  Be sure to include plenty of vegetables, fruits, low-fat dairy products, and lean protein.  Do not eat a lot of foods high in solid fats, added sugars, or salt.  Get regular exercise. This is one of the most important things you can do for your health. ? Most adults should exercise for at least 150 minutes each week. The exercise should increase your heart rate and make you sweat (moderate-intensity exercise). ? Most adults should also do strengthening exercises at least twice a week. This is in addition to the moderate-intensity exercise.  Maintain a healthy weight  Body mass index (BMI) is a measurement that can be used to identify possible weight problems. It estimates body fat based on height and weight. Your health care provider can help determine your BMI and help you achieve or maintain a healthy weight.  For females 62 years of age and older: ? A BMI below 18.5 is considered underweight. ? A BMI of 18.5 to 24.9 is normal. ? A BMI of 25 to 29.9 is considered overweight. ? A BMI of 30 and above is considered obese.  Watch levels of cholesterol and blood lipids  You should start having your blood tested for lipids and cholesterol at 58 years of age, then have this test every 5 years.  You may need to have your  cholesterol levels checked more often if: ? Your lipid or cholesterol levels are high. ? You are older than 58 years of age. ? You are at high risk for heart disease.  Cancer screening Lung Cancer  Lung cancer screening is recommended for adults 55-79 years old who are at high risk for lung cancer because of a history of smoking.  A yearly low-dose CT scan of the lungs is recommended for people who: ? Currently smoke. ? Have quit within the past 15 years. ? Have at least a 30-pack-year history of smoking. A pack year is smoking an average of one pack of cigarettes a day for 1 year.  Yearly screening should continue until it has been 15 years since you quit.  Yearly screening should stop if you develop a health problem that would prevent you from having lung cancer treatment.  Breast Cancer  Practice breast self-awareness. This means understanding how your breasts normally appear and feel.  It also means doing regular breast self-exams. Let your health care provider know about any changes, no matter how small.  If you are in your 20s or 30s, you should have a clinical breast exam (CBE) by a health care provider every 1-3 years as part of a regular health exam.  If you are 71 or older, have a CBE every year. Also consider having a breast X-ray (mammogram) every year.  If you have a family history of breast cancer, talk to your  health care provider about genetic screening.  If you are at high risk for breast cancer, talk to your health care provider about having an MRI and a mammogram every year.  Breast cancer gene (BRCA) assessment is recommended for women who have family members with BRCA-related cancers. BRCA-related cancers include: ? Breast. ? Ovarian. ? Tubal. ? Peritoneal cancers.  Results of the assessment will determine the need for genetic counseling and BRCA1 and BRCA2 testing.  Cervical Cancer Your health care provider may recommend that you be screened regularly  for cancer of the pelvic organs (ovaries, uterus, and vagina). This screening involves a pelvic examination, including checking for microscopic changes to the surface of your cervix (Pap test). You may be encouraged to have this screening done every 3 years, beginning at age 79.  For women ages 29-65, health care providers may recommend pelvic exams and Pap testing every 3 years, or they may recommend the Pap and pelvic exam, combined with testing for human papilloma virus (HPV), every 5 years. Some types of HPV increase your risk of cervical cancer. Testing for HPV may also be done on women of any age with unclear Pap test results.  Other health care providers may not recommend any screening for nonpregnant women who are considered low risk for pelvic cancer and who do not have symptoms. Ask your health care provider if a screening pelvic exam is right for you.  If you have had past treatment for cervical cancer or a condition that could lead to cancer, you need Pap tests and screening for cancer for at least 20 years after your treatment. If Pap tests have been discontinued, your risk factors (such as having a new sexual partner) need to be reassessed to determine if screening should resume. Some women have medical problems that increase the chance of getting cervical cancer. In these cases, your health care provider may recommend more frequent screening and Pap tests.  Colorectal Cancer  This type of cancer can be detected and often prevented.  Routine colorectal cancer screening usually begins at 58 years of age and continues through 59 years of age.  Your health care provider may recommend screening at an earlier age if you have risk factors for colon cancer.  Your health care provider may also recommend using home test kits to check for hidden blood in the stool.  A small camera at the end of a tube can be used to examine your colon directly (sigmoidoscopy or colonoscopy). This is done to  check for the earliest forms of colorectal cancer.  Routine screening usually begins at age 48.  Direct examination of the colon should be repeated every 5-10 years through 58 years of age. However, you may need to be screened more often if early forms of precancerous polyps or small growths are found.  Skin Cancer  Check your skin from head to toe regularly.  Tell your health care provider about any new moles or changes in moles, especially if there is a change in a mole's shape or color.  Also tell your health care provider if you have a mole that is larger than the size of a pencil eraser.  Always use sunscreen. Apply sunscreen liberally and repeatedly throughout the day.  Protect yourself by wearing long sleeves, pants, a wide-brimmed hat, and sunglasses whenever you are outside.  Heart disease, diabetes, and high blood pressure  High blood pressure causes heart disease and increases the risk of stroke. High blood pressure is more likely to  develop in: ? People who have blood pressure in the high end of the normal range (130-139/85-89 mm Hg). ? People who are overweight or obese. ? People who are African American.  If you are 33-59 years of age, have your blood pressure checked every 3-5 years. If you are 66 years of age or older, have your blood pressure checked every year. You should have your blood pressure measured twice-once when you are at a hospital or clinic, and once when you are not at a hospital or clinic. Record the average of the two measurements. To check your blood pressure when you are not at a hospital or clinic, you can use: ? An automated blood pressure machine at a pharmacy. ? A home blood pressure monitor.  If you are between 20 years and 51 years old, ask your health care provider if you should take aspirin to prevent strokes.  Have regular diabetes screenings. This involves taking a blood sample to check your fasting blood sugar level. ? If you are at a  normal weight and have a low risk for diabetes, have this test once every three years after 58 years of age. ? If you are overweight and have a high risk for diabetes, consider being tested at a younger age or more often. Preventing infection Hepatitis B  If you have a higher risk for hepatitis B, you should be screened for this virus. You are considered at high risk for hepatitis B if: ? You were born in a country where hepatitis B is common. Ask your health care provider which countries are considered high risk. ? Your parents were born in a high-risk country, and you have not been immunized against hepatitis B (hepatitis B vaccine). ? You have HIV or AIDS. ? You use needles to inject street drugs. ? You live with someone who has hepatitis B. ? You have had sex with someone who has hepatitis B. ? You get hemodialysis treatment. ? You take certain medicines for conditions, including cancer, organ transplantation, and autoimmune conditions.  Hepatitis C  Blood testing is recommended for: ? Everyone born from [birth year] through 1967. ? Anyone with known risk factors for hepatitis C.  Sexually transmitted infections (STIs)  You should be screened for sexually transmitted infections (STIs) including gonorrhea and chlamydia if: ? You are sexually active and are younger than 58 years of age. ? You are older than 58 years of age and your health care provider tells you that you are at risk for this type of infection. ? Your sexual activity has changed since you were last screened and you are at an increased risk for chlamydia or gonorrhea. Ask your health care provider if you are at risk.  If you do not have HIV, but are at risk, it may be recommended that you take a prescription medicine daily to prevent HIV infection. This is called pre-exposure prophylaxis (PrEP). You are considered at risk if: ? You are sexually active and do not regularly use condoms or know the HIV status of your  partner(s). ? You take drugs by injection. ? You are sexually active with a partner who has HIV.  Talk with your health care provider about whether you are at high risk of being infected with HIV. If you choose to begin PrEP, you should first be tested for HIV. You should then be tested every 3 months for as long as you are taking PrEP. Pregnancy  If you are premenopausal and you may become pregnant,  ask your health care provider about preconception counseling.  If you may become pregnant, take 400 to 800 micrograms (mcg) of folic acid every day.  If you want to prevent pregnancy, talk to your health care provider about birth control (contraception). Osteoporosis and menopause  Osteoporosis is a disease in which the bones lose minerals and strength with aging. This can result in serious bone fractures. Your risk for osteoporosis can be identified using a bone density scan.  If you are 42 years of age or older, or if you are at risk for osteoporosis and fractures, ask your health care provider if you should be screened.  Ask your health care provider whether you should take a calcium or vitamin D supplement to lower your risk for osteoporosis.  Menopause may have certain physical symptoms and risks.  Hormone replacement therapy may reduce some of these symptoms and risks. Talk to your health care provider about whether hormone replacement therapy is right for you. Follow these instructions at home:  Schedule regular health, dental, and eye exams.  Stay current with your immunizations.  Do not use any tobacco products including cigarettes, chewing tobacco, or electronic cigarettes.  If you are pregnant, do not drink alcohol.  If you are breastfeeding, limit how much and how often you drink alcohol.  Limit alcohol intake to no more than 1 drink per day for nonpregnant women. One drink equals 12 ounces of beer, 5 ounces of wine, or 1 ounces of hard liquor.  Do not use street  drugs.  Do not share needles.  Ask your health care provider for help if you need support or information about quitting drugs.  Tell your health care provider if you often feel depressed.  Tell your health care provider if you have ever been abused or do not feel safe at home. This information is not intended to replace advice given to you by your health care provider. Make sure you discuss any questions you have with your health care provider. Document Released: 07/10/2010 Document Revised: 06/02/2015 Document Reviewed: 09/28/2014 Elsevier Interactive Patient Education  [employer].

## 2016-11-12 NOTE — Assessment & Plan Note (Signed)
BP at goal on her atenolol. Checking CMP and adjust as needed.

## 2016-11-13 LAB — HEPATITIS C ANTIBODY
HEP C AB: NONREACTIVE
SIGNAL TO CUT-OFF: 0.05 (ref <1.00)

## 2016-11-19 MED FILL — TOLTERODINE TART ER 4 MG CA: 4 | 30 days supply | Qty: 30 | Fill #1

## 2016-11-20 ENCOUNTER — Other Ambulatory Visit: Payer: Self-pay

## 2016-11-20 MED ORDER — ATENOLOL 50 MG PO TABS: 50.0000 mg | ORAL_TABLET | Freq: Every day | ORAL | 3 refills | Status: DC

## 2016-11-20 MED FILL — ATENOLOL 50 MG TABLET: 50 | 30 days supply | Qty: 30 | Fill #0

## 2016-11-26 MED FILL — PANTOPRAZOLE SOD DR 40 MG T: 40 | 30 days supply | Qty: 30 | Fill #1

## 2016-11-28 ENCOUNTER — Other Ambulatory Visit: Payer: Self-pay | Admitting: Internal Medicine

## 2016-11-28 MED FILL — PRAVASTATIN NA 20 MG TAB: 20 | 90 days supply | Qty: 90 | Fill #0

## 2016-12-19 ENCOUNTER — Other Ambulatory Visit: Payer: Self-pay | Admitting: Internal Medicine

## 2016-12-19 MED FILL — TOLTERODINE TART ER 4 MG CA: 4 | 30 days supply | Qty: 30 | Fill #0

## 2016-12-20 ENCOUNTER — Ambulatory Visit (INDEPENDENT_AMBULATORY_CARE_PROVIDER_SITE_OTHER): Payer: 59

## 2016-12-20 DIAGNOSIS — Z299 Encounter for prophylactic measures, unspecified: Secondary | ICD-10-CM | POA: Diagnosis not present

## 2016-12-20 MED FILL — PANTOPRAZOLE SOD DR 40 MG T: 40 | 30 days supply | Qty: 30 | Fill #0

## 2016-12-21 ENCOUNTER — Telehealth: Payer: Self-pay | Admitting: *Deleted

## 2016-12-21 NOTE — Telephone Encounter (Signed)
Notified pt w/MD response.../lmb 

## 2016-12-21 NOTE — Telephone Encounter (Signed)
Can take benadryl every 4-6 hours as needed and continue using ice on the area. If worsening call back. This is likely a localized reaction to the shot and should resolve in 3-5 days.

## 2016-12-21 NOTE — Telephone Encounter (Signed)
Pt walk-in this am concern about the shingle injection she received yesterday. ? If she is having an allergic reaction to it. Injection site is red, swollen area about 1 and half inches, and warm to the touch. Pt stated it started looking like that last night, denies any pain, sob, no other symptoms. She has been using ice on it requesting md recommendation...Johny Chess

## 2016-12-30 MED FILL — ATENOLOL 50 MG TABLET: 50 | 30 days supply | Qty: 30 | Fill #1

## 2017-01-17 MED FILL — TOLTERODINE TART ER 4 MG CA: 4 | 30 days supply | Qty: 30 | Fill #1

## 2017-01-17 MED FILL — PANTOPRAZOLE SOD DR 40 MG T: 40 | 30 days supply | Qty: 30 | Fill #1

## 2017-01-27 MED FILL — ATENOLOL 50 MG TABLET: 50 | 30 days supply | Qty: 30 | Fill #2

## 2017-02-18 MED FILL — PANTOPRAZOLE SOD DR 40 MG T: 40 | 30 days supply | Qty: 30 | Fill #2

## 2017-02-18 MED FILL — TOLTERODINE TART ER 4 MG CA: 4 | 30 days supply | Qty: 30 | Fill #2

## 2017-02-20 ENCOUNTER — Ambulatory Visit (INDEPENDENT_AMBULATORY_CARE_PROVIDER_SITE_OTHER): Payer: 59 | Admitting: *Deleted

## 2017-02-20 DIAGNOSIS — Z23 Encounter for immunization: Secondary | ICD-10-CM

## 2017-02-27 MED FILL — PRAVASTATIN SODIUM 20 MG TA: 20 | 90 days supply | Qty: 90 | Fill #1

## 2017-02-27 MED FILL — ATENOLOL 50 MG TABLET: 50 | 30 days supply | Qty: 30 | Fill #3

## 2017-03-15 MED FILL — TOLTERODINE TART ER 4 MG CA: 4 | 30 days supply | Qty: 30 | Fill #3

## 2017-03-15 MED FILL — PANTOPRAZOLE SOD DR 40 MG T: 40 | 30 days supply | Qty: 30 | Fill #3

## 2017-03-19 ENCOUNTER — Encounter: Payer: Self-pay | Admitting: Internal Medicine

## 2017-03-20 MED ORDER — TRAZODONE HCL 50 MG PO TABS: 50.0000 mg | ORAL_TABLET | Freq: Every evening | ORAL | 3 refills | Status: DC | PRN

## 2017-03-20 MED FILL — traZODone HCL 50 MG TABS: 50 | 30 days supply | Qty: 60 | Fill #0

## 2017-03-27 ENCOUNTER — Other Ambulatory Visit: Payer: Self-pay | Admitting: Internal Medicine

## 2017-03-27 MED FILL — ATENOLOL 50 MG TABLET: 50 | 30 days supply | Qty: 30 | Fill #0

## 2017-04-18 ENCOUNTER — Other Ambulatory Visit: Payer: Self-pay | Admitting: Internal Medicine

## 2017-04-18 MED FILL — TOLTERODINE TART ER 4 MG CA: 4 | 30 days supply | Qty: 30 | Fill #4

## 2017-04-18 MED FILL — PANTOPRAZOLE SOD DR 40 MG T: 40 | 30 days supply | Qty: 30 | Fill #0

## 2017-05-01 MED FILL — ATENOLOL 50 MG TABLET: 50 | 30 days supply | Qty: 30 | Fill #1

## 2017-05-01 MED FILL — traZODone HCL 50 MG TABS: 50 | 30 days supply | Qty: 60 | Fill #1

## 2017-05-20 MED FILL — PANTOPRAZOLE SOD DR 40 MG T: 40 | 30 days supply | Qty: 30 | Fill #1

## 2017-05-20 MED FILL — TOLTERODINE TART ER 4 MG CA: 4 | 30 days supply | Qty: 30 | Fill #5

## 2017-05-27 MED FILL — ATENOLOL 50 MG TABLET: 50 | 30 days supply | Qty: 30 | Fill #2

## 2017-05-27 MED FILL — PRAVASTATIN SODIUM 20 MG TA: 20 | 90 days supply | Qty: 90 | Fill #2

## 2017-06-20 MED FILL — PANTOPRAZOLE SOD DR 40 MG T: 40 | 30 days supply | Qty: 30 | Fill #2

## 2017-06-20 MED FILL — TOLTERODINE TART ER 4 MG CA: 4 | 30 days supply | Qty: 30 | Fill #6

## 2017-07-03 MED FILL — ATENOLOL 50 MG TABLET: 50 | 30 days supply | Qty: 30 | Fill #3

## 2017-07-22 MED FILL — TOLTERODINE TART ER 4 MG CA: 4 | 30 days supply | Qty: 30 | Fill #7

## 2017-07-22 MED FILL — PANTOPRAZOLE SOD DR 40 MG T: 40 | 30 days supply | Qty: 30 | Fill #3

## 2017-07-31 ENCOUNTER — Other Ambulatory Visit: Payer: Self-pay | Admitting: Internal Medicine

## 2017-07-31 MED FILL — ATENOLOL 50 MG TABLET: 50 | 30 days supply | Qty: 30 | Fill #0

## 2017-08-26 MED FILL — TOLTERODINE TART ER 4 MG CA: 4 | 30 days supply | Qty: 30 | Fill #8

## 2017-08-27 ENCOUNTER — Other Ambulatory Visit: Payer: Self-pay | Admitting: Internal Medicine

## 2017-08-27 MED FILL — PANTOPRAZOLE SOD DR 40 MG T: 40 | 30 days supply | Qty: 30 | Fill #0

## 2017-08-27 MED FILL — PRAVASTATIN SODIUM 20 MG TA: 20 | 90 days supply | Qty: 90 | Fill #3

## 2017-09-03 MED FILL — ATENOLOL 50 MG TABLET: 50 | 90 days supply | Qty: 90 | Fill #1

## 2017-09-23 MED FILL — TOLTERODINE TART ER 4 MG CA: 4 | 30 days supply | Qty: 30 | Fill #9

## 2017-09-23 MED FILL — PANTOPRAZOLE SOD DR 40 MG T: 40 | 30 days supply | Qty: 30 | Fill #1

## 2017-09-27 DIAGNOSIS — H18413 Arcus senilis, bilateral: Secondary | ICD-10-CM | POA: Diagnosis not present

## 2017-09-27 DIAGNOSIS — H52223 Regular astigmatism, bilateral: Secondary | ICD-10-CM | POA: Diagnosis not present

## 2017-09-27 DIAGNOSIS — H5203 Hypermetropia, bilateral: Secondary | ICD-10-CM | POA: Diagnosis not present

## 2017-09-27 DIAGNOSIS — H2513 Age-related nuclear cataract, bilateral: Secondary | ICD-10-CM | POA: Diagnosis not present

## 2017-10-20 MED FILL — TOLTERODINE TART ER 4 MG CA: 4 | 30 days supply | Qty: 30 | Fill #10

## 2017-10-20 MED FILL — PANTOPRAZOLE SOD DR 40 MG T: 40 | 30 days supply | Qty: 30 | Fill #2

## 2017-11-04 DIAGNOSIS — N3281 Overactive bladder: Secondary | ICD-10-CM | POA: Diagnosis not present

## 2017-11-04 DIAGNOSIS — Z6834 Body mass index (BMI) 34.0-34.9, adult: Secondary | ICD-10-CM | POA: Diagnosis not present

## 2017-11-04 DIAGNOSIS — Z1389 Encounter for screening for other disorder: Secondary | ICD-10-CM | POA: Diagnosis not present

## 2017-11-04 DIAGNOSIS — Z01419 Encounter for gynecological examination (general) (routine) without abnormal findings: Secondary | ICD-10-CM | POA: Diagnosis not present

## 2017-11-04 DIAGNOSIS — Z130 Encounter for screening for diseases of the blood and blood-forming organs and certain disorders involving the immune mechanism: Secondary | ICD-10-CM | POA: Diagnosis not present

## 2017-11-04 DIAGNOSIS — Z1231 Encounter for screening mammogram for malignant neoplasm of breast: Secondary | ICD-10-CM | POA: Diagnosis not present

## 2017-11-06 ENCOUNTER — Encounter: Payer: Self-pay | Admitting: Internal Medicine

## 2017-11-06 NOTE — Progress Notes (Signed)
Abstracted and sent to scan  

## 2017-11-15 ENCOUNTER — Encounter: Payer: Self-pay | Admitting: Internal Medicine

## 2017-11-15 ENCOUNTER — Other Ambulatory Visit (INDEPENDENT_AMBULATORY_CARE_PROVIDER_SITE_OTHER): Payer: 59

## 2017-11-15 ENCOUNTER — Ambulatory Visit (INDEPENDENT_AMBULATORY_CARE_PROVIDER_SITE_OTHER): Payer: 59 | Admitting: Internal Medicine

## 2017-11-15 VITALS — BP 118/82 | HR 76 | Temp 98.4°F | Ht 68.0 in | Wt 216.0 lb

## 2017-11-15 DIAGNOSIS — K219 Gastro-esophageal reflux disease without esophagitis: Secondary | ICD-10-CM | POA: Diagnosis not present

## 2017-11-15 DIAGNOSIS — E669 Obesity, unspecified: Secondary | ICD-10-CM | POA: Diagnosis not present

## 2017-11-15 DIAGNOSIS — Z6832 Body mass index (BMI) 32.0-32.9, adult: Secondary | ICD-10-CM

## 2017-11-15 DIAGNOSIS — Z0000 Encounter for general adult medical examination without abnormal findings: Secondary | ICD-10-CM

## 2017-11-15 DIAGNOSIS — E785 Hyperlipidemia, unspecified: Secondary | ICD-10-CM

## 2017-11-15 DIAGNOSIS — I10 Essential (primary) hypertension: Secondary | ICD-10-CM | POA: Diagnosis not present

## 2017-11-15 LAB — LIPID PANEL
CHOL/HDL RATIO: 3
Cholesterol: 188 mg/dL (ref 0–200)
HDL: 56.6 mg/dL (ref >39.00)
LDL CALC: 119 mg/dL — AB (ref 0–99)
NonHDL: 131.45
Triglycerides: 64 mg/dL (ref 0.0–149.0)
VLDL: 12.8 mg/dL (ref 0.0–40.0)

## 2017-11-15 LAB — COMPREHENSIVE METABOLIC PANEL
ALBUMIN: 4.6 g/dL (ref 3.5–5.2)
ALT: 22 U/L (ref 0–35)
AST: 17 U/L (ref 0–37)
Alkaline Phosphatase: 72 U/L (ref 39–117)
BILIRUBIN TOTAL: 0.8 mg/dL (ref 0.2–1.2)
BUN: 14 mg/dL (ref 6–23)
CALCIUM: 9.7 mg/dL (ref 8.4–10.5)
CHLORIDE: 105 meq/L (ref 96–112)
CO2: 28 meq/L (ref 19–32)
CREATININE: 0.98 mg/dL (ref 0.40–1.20)
GFR: 74.74 mL/min (ref >60.00)
Glucose, Bld: 105 mg/dL — ABNORMAL HIGH (ref 70–99)
Potassium: 4 mEq/L (ref 3.5–5.1)
SODIUM: 142 meq/L (ref 135–145)
Total Protein: 7.2 g/dL (ref 6.0–8.3)

## 2017-11-15 LAB — CBC
HCT: 41.9 % (ref 36.0–46.0)
Hemoglobin: 13.8 g/dL (ref 12.0–15.0)
MCHC: 33 g/dL (ref 30.0–36.0)
MCV: 76.8 fl — AB (ref 78.0–100.0)
PLATELETS: 347 10*3/uL (ref 150.0–400.0)
RBC: 5.45 Mil/uL — AB (ref 3.87–5.11)
RDW: 13.9 % (ref 11.5–15.5)
WBC: 4.4 10*3/uL (ref 4.0–10.5)

## 2017-11-15 LAB — HEMOGLOBIN A1C: HEMOGLOBIN A1C: 5.8 % (ref 4.6–6.5)

## 2017-11-15 MED ORDER — PRAVASTATIN SODIUM 20 MG PO TABS: 20.0000 mg | ORAL_TABLET | Freq: Every day | ORAL | 3 refills | Status: DC

## 2017-11-15 MED ORDER — ATENOLOL 50 MG PO TABS: 50.0000 mg | ORAL_TABLET | Freq: Every day | ORAL | 3 refills | Status: DC

## 2017-11-15 MED ORDER — PANTOPRAZOLE SODIUM 40 MG PO TBEC
40.0000 mg | DELAYED_RELEASE_TABLET | Freq: Every day | ORAL | 3 refills | Status: DC
Start: 2017-11-15 — End: 2018-11-17

## 2017-11-15 MED FILL — PANTOPRAZOLE SOD DR 40 MG T: 40 | 90 days supply | Qty: 90 | Fill #0

## 2017-11-15 MED FILL — PRAVASTATIN SODIUM 20 MG TA: 20 | 90 days supply | Qty: 90 | Fill #0

## 2017-11-15 MED FILL — ATENOLOL 50 MG TABLET: 50 | 90 days supply | Qty: 90 | Fill #0

## 2017-11-15 NOTE — Assessment & Plan Note (Signed)
Flu shot up to date. Shingrix complete. Tetanus up to date. Colonoscopy up to date, next Dec 2019. Mammogram up to date, pap smear up to date. Counseled about sun safety and mole surveillance. Counseled about the dangers of distracted driving. Given 10 year screening recommendations.

## 2017-11-15 NOTE — Progress Notes (Signed)
   Subjective:    Patient ID: [name], female    DOB: August 31, 1960, 57 y.o.   MRN: [id number]  HPI The patient is a 57 YO female coming in for physical.   PMH, [facility], social history reviewed and updated.   Review of Systems  Constitutional: Negative.   HENT: Negative.   Eyes: Negative.   Respiratory: Negative for cough, chest tightness and shortness of breath.   Cardiovascular: Negative for chest pain, palpitations and leg swelling.  Gastrointestinal: Negative for abdominal distention, abdominal pain, constipation, diarrhea, nausea and vomiting.  Musculoskeletal: Negative.   Skin: Negative.   Neurological: Negative.   Psychiatric/Behavioral: Negative.       Objective:   Physical Exam  Constitutional: She is oriented to person, place, and time. She appears well-developed and well-nourished.  HENT:  Head: Normocephalic and atraumatic.  Eyes: EOM are normal.  Neck: Normal range of motion.  Cardiovascular: Normal rate and regular rhythm.  Pulmonary/Chest: Effort normal and breath sounds normal. No respiratory distress. She has no wheezes. She has no rales.  Abdominal: Soft. Bowel sounds are normal. She exhibits no distension. There is no tenderness. There is no rebound.  Musculoskeletal: She exhibits no edema.  Neurological: She is alert and oriented to person, place, and time. Coordination normal.  Skin: Skin is warm and dry.  Psychiatric: She has a normal mood and affect.   Vitals:   11/15/17 0839  BP: 118/82  Pulse: 76  Temp: 98.4 F (36.9 C)  TempSrc: Oral  SpO2: 95%  Weight: 216 lb (98 kg)  Height: 5\' 8"  (1.727 m)      Assessment & Plan:

## 2017-11-15 NOTE — Assessment & Plan Note (Signed)
Checking CMP and adjust atenolol 50 mg daily if needed. BP at goal.

## 2017-11-15 NOTE — Assessment & Plan Note (Signed)
Taking protonix and working well, elects to continue. Refilled today.

## 2017-11-15 NOTE — Assessment & Plan Note (Signed)
Weight down about 5 pounds since last year and she has given up sodas.

## 2017-11-15 NOTE — Assessment & Plan Note (Signed)
Checking lipid panel and adjust pravastatin 20 mg daily if needed.

## 2017-11-15 NOTE — Patient Instructions (Signed)

## 2017-11-28 MED FILL — TOLTERODINE TART ER 4 MG CA: 4 | 30 days supply | Qty: 30 | Fill #0

## 2017-12-23 ENCOUNTER — Encounter: Payer: Self-pay | Admitting: Internal Medicine

## 2017-12-26 MED FILL — TOLTERODINE TART ER 4 MG CA: 4 | 30 days supply | Qty: 30 | Fill #1

## 2018-01-26 MED FILL — TOLTERODINE TART ER 4 MG CA: 4 | 30 days supply | Qty: 30 | Fill #2

## 2018-02-19 MED FILL — PANTOPRAZOLE SOD DR 40 MG T: 40 | 90 days supply | Qty: 90 | Fill #1

## 2018-02-19 MED FILL — ATENOLOL 50 MG TABLET: 50 | 90 days supply | Qty: 90 | Fill #1

## 2018-02-19 MED FILL — PRAVASTATIN SODIUM 20 MG TA: 20 | 90 days supply | Qty: 90 | Fill #1

## 2018-02-19 MED FILL — TOLTERODINE TART ER 4 MG CA: 4 | 30 days supply | Qty: 30 | Fill #3

## 2018-03-03 ENCOUNTER — Ambulatory Visit: Payer: 59 | Admitting: Internal Medicine

## 2018-03-03 ENCOUNTER — Encounter: Payer: Self-pay | Admitting: Internal Medicine

## 2018-03-03 DIAGNOSIS — J069 Acute upper respiratory infection, unspecified: Secondary | ICD-10-CM | POA: Diagnosis not present

## 2018-03-03 DIAGNOSIS — B9789 Other viral agents as the cause of diseases classified elsewhere: Secondary | ICD-10-CM

## 2018-03-03 MED ORDER — FLUTICASONE PROPIONATE 50 MCG/ACT NA SUSP: 2.0000 | Freq: Every day | NASAL | 6 refills | Status: DC

## 2018-03-03 MED ORDER — PROMETHAZINE-DM 6.25-15 MG/5ML PO SYRP: 5.0000 mL | ORAL_SOLUTION | Freq: Two times a day (BID) | ORAL | 0 refills | Status: DC | PRN

## 2018-03-03 MED FILL — PROMETHAZINE W/DM SYRUP: 6.25-15 | 10 days supply | Qty: 100 | Fill #0

## 2018-03-03 MED FILL — FLUTICASONE PROP 50 MCG SPR: 50 | 30 days supply | Qty: 16 | Fill #0

## 2018-03-03 NOTE — Patient Instructions (Signed)
We have sent in flonase to use 2 sprays in each nostril daily.    We have sent in cough medicine to use at night time to cough less.

## 2018-03-03 NOTE — Progress Notes (Signed)
   Subjective:   Patient ID: [name], female    DOB: [birth year], 58 y.o.   MRN: [id number]  HPI The patient is a 58 y.o. female coming in for cold symptoms. Started 5 days ago. Main symptoms are: cough, drainage, sore throat, voice change. Denies fevers but mild chills, denies SOB. Overall it is stable since onset. Has tried mucinex which was not helpful. Cough productive of some yellow sputum.   Review of Systems  Constitutional: Positive for activity change, appetite change and chills. Negative for fatigue, fever and unexpected weight change.  HENT: Positive for congestion, postnasal drip, rhinorrhea and sinus pressure. Negative for ear discharge, ear pain, sinus pain, sneezing, sore throat, tinnitus, trouble swallowing and voice change.   Eyes: Negative.   Respiratory: Positive for cough. Negative for chest tightness, shortness of breath and wheezing.   Cardiovascular: Negative.   Gastrointestinal: Negative.   Musculoskeletal: Positive for myalgias.  Neurological: Negative.     Objective:  Physical Exam Constitutional:      Appearance: She is well-developed.  HENT:     Head: Normocephalic and atraumatic.     Comments: Oropharynx with redness and clear drainage, nose with swollen turbinates, TMs normal bilaterally.  Neck:     Musculoskeletal: Normal range of motion.     Thyroid: No thyromegaly.  Cardiovascular:     Rate and Rhythm: Normal rate and regular rhythm.  Pulmonary:     Effort: Pulmonary effort is normal. No respiratory distress.     Breath sounds: Normal breath sounds. No wheezing or rales.  Abdominal:     Palpations: Abdomen is soft.  Musculoskeletal:        General: Tenderness present.  Lymphadenopathy:     Cervical: No cervical adenopathy.  Skin:    General: Skin is warm and dry.  Neurological:     Mental Status: She is alert and oriented to person, place, and time.     Vitals:   03/03/18 0950  BP: 118/80  Pulse: 69  Temp: 98.4 F (36.9 C)    TempSrc: Oral  SpO2: 98%  Weight: 213 lb (96.6 kg)  Height: 5\' 8"  (1.727 m)    Assessment & Plan:

## 2018-03-03 NOTE — Assessment & Plan Note (Signed)
Rx for tessalon perles and promethazine/dm cough medicine. Advised of typical course and no indication for antibiotics or steroids today.

## 2018-03-24 MED FILL — TOLTERODINE TART ER 4 MG CA: 4 | 30 days supply | Qty: 30 | Fill #4

## 2018-03-26 ENCOUNTER — Ambulatory Visit: Payer: 59 | Admitting: Family Medicine

## 2018-03-26 ENCOUNTER — Ambulatory Visit: Payer: Self-pay

## 2018-03-26 ENCOUNTER — Other Ambulatory Visit: Payer: Self-pay

## 2018-03-26 ENCOUNTER — Encounter: Payer: Self-pay | Admitting: Family Medicine

## 2018-03-26 VITALS — BP 116/76 | HR 80 | Ht 68.0 in | Wt 213.0 lb

## 2018-03-26 DIAGNOSIS — M23004 Cystic meniscus, unspecified medial meniscus, left knee: Secondary | ICD-10-CM | POA: Insufficient documentation

## 2018-03-26 DIAGNOSIS — G8929 Other chronic pain: Secondary | ICD-10-CM | POA: Diagnosis not present

## 2018-03-26 DIAGNOSIS — M222X2 Patellofemoral disorders, left knee: Secondary | ICD-10-CM | POA: Diagnosis not present

## 2018-03-26 DIAGNOSIS — M25562 Pain in left knee: Principal | ICD-10-CM

## 2018-03-26 NOTE — Assessment & Plan Note (Signed)
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  Patient does have a asymptomatic medial ganglion cyst that does not appear to be part of the medial meniscus.  Patient though does have moderate narrowing of the patellofemoral as well as medial joint space.  Tru pull lite brace given, home exercise, topical anti-inflammatories and avoiding twisting motions.  Follow-up again in 4 to 6 weeks.  Worsening pain consider formal physical therapy as well as potential injections.
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
## 2018-03-26 NOTE — Patient Instructions (Addendum)
Good to see you  Ice 20 minutes 2 times daily. Usually after activity and before bed. Exercises 3 times a week.  pennsaid pinkie amount topically 2 times daily as needed.  Avoid twisting motions when you can  No running or jumping Wear the brace daily 1-2 weeks then only with exercise for 6 weeks See me again in 6-8 weeks and if still in pain will try draining the cyst on the knee.

## 2018-04-15 MED FILL — TOLTERODINE TART ER 4 MG CA: 4 | 30 days supply | Qty: 30 | Fill #5

## 2018-04-21 MED FILL — FLUTICASONE PROP 50 MCG SPR: 50 | 30 days supply | Qty: 16 | Fill #1

## 2018-04-30 ENCOUNTER — Telehealth: Payer: Self-pay | Admitting: Family Medicine

## 2018-04-30 MED ORDER — DICLOFENAC SODIUM 2 % TD SOLN: TRANSDERMAL | 3 refills | Status: DC

## 2018-04-30 NOTE — Telephone Encounter (Signed)
Patient called to check on the prescription for Pensaid. She said that she never received anything in the mail. Can this be sent again?

## 2018-04-30 NOTE — Telephone Encounter (Signed)
rx sent into pharmacy

## 2018-05-07 ENCOUNTER — Ambulatory Visit: Payer: Self-pay | Admitting: Family Medicine

## 2018-05-22 MED FILL — ATENOLOL 50 MG TABLET: 50 | 90 days supply | Qty: 90 | Fill #2

## 2018-05-22 MED FILL — PANTOPRAZOLE SOD DR 40 MG T: 40 | 90 days supply | Qty: 90 | Fill #2

## 2018-05-22 MED FILL — PRAVASTATIN SODIUM 20 MG TA: 20 | 90 days supply | Qty: 90 | Fill #2

## 2018-05-22 MED FILL — TOLTERODINE TART ER 4 MG CA: 4 | 30 days supply | Qty: 30 | Fill #6

## 2018-06-06 MED FILL — FLUTICASONE PROP 50 MCG SPR: 50 | 30 days supply | Qty: 16 | Fill #2

## 2018-06-22 MED FILL — TOLTERODINE TART ER 4 MG CA: 4 | 30 days supply | Qty: 30 | Fill #7

## 2018-06-23 ENCOUNTER — Telehealth: Payer: Self-pay

## 2018-06-23 DIAGNOSIS — Z86010 Personal history of colonic polyps: Secondary | ICD-10-CM

## 2018-06-23 NOTE — Telephone Encounter (Signed)
[facility] set up colon, overdue. It was due in December and then COVID came so it was delayed. Instructions given to patient and consent signed.

## 2018-07-07 ENCOUNTER — Encounter: Payer: Self-pay | Admitting: Internal Medicine

## 2018-07-19 ENCOUNTER — Encounter: Payer: 59 | Admitting: Internal Medicine

## 2018-07-25 MED FILL — TOLTERODINE TART ER 4 MG CA: 4 | 30 days supply | Qty: 30 | Fill #8

## 2018-08-18 MED FILL — PRAVASTATIN SODIUM 20 MG TA: 20 | 90 days supply | Qty: 90 | Fill #3

## 2018-08-18 MED FILL — PANTOPRAZOLE SOD DR 40 MG T: 40 | 90 days supply | Qty: 90 | Fill #3

## 2018-08-18 MED FILL — ATENOLOL 50 MG TABLET: 50 | 90 days supply | Qty: 90 | Fill #3

## 2018-08-18 MED FILL — TOLTERODINE TART ER 4 MG CA: 4 | 30 days supply | Qty: 30 | Fill #9

## 2018-09-05 ENCOUNTER — Encounter: Payer: Self-pay | Admitting: Internal Medicine

## 2018-09-16 ENCOUNTER — Telehealth: Payer: Self-pay

## 2018-09-16 DIAGNOSIS — Z1211 Encounter for screening for malignant neoplasm of colon: Secondary | ICD-10-CM

## 2018-09-16 NOTE — Telephone Encounter (Signed)
Colonoscopy set up and instructions printed and given to patient.

## 2018-09-19 ENCOUNTER — Ambulatory Visit (INDEPENDENT_AMBULATORY_CARE_PROVIDER_SITE_OTHER): Payer: 59

## 2018-09-19 ENCOUNTER — Other Ambulatory Visit: Payer: Self-pay

## 2018-09-19 DIAGNOSIS — Z23 Encounter for immunization: Secondary | ICD-10-CM | POA: Diagnosis not present

## 2018-09-20 MED FILL — TOLTERODINE TART ER 4 MG CA: 4 | 30 days supply | Qty: 30 | Fill #10

## 2018-10-14 MED FILL — TOLTERODINE TART ER 4 MG CA: 4 | 30 days supply | Qty: 30 | Fill #11

## 2018-10-28 DIAGNOSIS — H9201 Otalgia, right ear: Secondary | ICD-10-CM | POA: Diagnosis not present

## 2018-10-28 DIAGNOSIS — H9209 Otalgia, unspecified ear: Secondary | ICD-10-CM | POA: Insufficient documentation

## 2018-11-04 ENCOUNTER — Encounter: Payer: Self-pay | Admitting: Internal Medicine

## 2018-11-07 ENCOUNTER — Encounter: Payer: Self-pay | Admitting: Internal Medicine

## 2018-11-07 ENCOUNTER — Ambulatory Visit (AMBULATORY_SURGERY_CENTER): Payer: 59 | Admitting: Internal Medicine

## 2018-11-07 ENCOUNTER — Other Ambulatory Visit: Payer: Self-pay

## 2018-11-07 VITALS — BP 106/73 | HR 63 | Temp 98.1°F | Resp 17 | Ht 68.0 in | Wt 213.0 lb

## 2018-11-07 DIAGNOSIS — Z1211 Encounter for screening for malignant neoplasm of colon: Secondary | ICD-10-CM | POA: Diagnosis not present

## 2018-11-07 MED ORDER — SODIUM CHLORIDE 0.9 % IV SOLN: 500.0000 mL | Freq: Once | INTRAVENOUS | Status: DC

## 2018-11-07 NOTE — Op Note (Signed)
[facility] Patient Name: [name] Procedure Date: 11/07/2018 8:25 AM MRN: [id number] Endoscopist: Gatha Mayer , MD Age: 58 Referring MD:  Date of Birth: Jan 08, 1961 Gender: Female Account #: [id number] Procedure:                Colonoscopy Indications:              Screening for colorectal malignant neoplasm, Last                            colonoscopy: 2009 Medicines:                Propofol per Anesthesia, Monitored Anesthesia Care Procedure:                Pre-Anesthesia Assessment:                           - Prior to the procedure, a History and Physical                            was performed, and patient medications and                            allergies were reviewed. The patient's tolerance of                            previous anesthesia was also reviewed. The risks                            and benefits of the procedure and the sedation                            options and risks were discussed with the patient.                            All questions were answered, and informed consent                            was obtained. Prior Anticoagulants: The patient has                            taken no previous anticoagulant or antiplatelet                            agents. ASA Grade Assessment: II - A patient with                            mild systemic disease. After reviewing the risks                            and benefits, the patient was deemed in                            satisfactory condition to undergo the procedure.  After obtaining informed consent, the colonoscope                            was passed under direct vision. Throughout the                            procedure, the patient's blood pressure, pulse, and                            oxygen saturations were monitored continuously. The                            Colonoscope was introduced through the anus and                            advanced to the  the cecum, identified by                            appendiceal orifice and ileocecal valve. The                            quality of the bowel preparation was excellent. The                            colonoscopy was performed without difficulty. The                            patient tolerated the procedure well. The bowel                            preparation used was Miralax via split dose                            instruction. Scope In: 8:28:30 AM Scope Out: 8:45:22 AM Scope Withdrawal Time: 0 hours 13 minutes 47 seconds  Total Procedure Duration: 0 hours 16 minutes 52 seconds  Findings:                 The perianal and digital rectal examinations were                            normal.                           The colon (entire examined portion) appeared normal.                           No additional abnormalities were found on                            retroflexion. Complications:            No immediate complications. Estimated blood loss:                            None. Estimated Blood Loss:     Estimated  blood loss: none. Recommendation:           - Repeat colonoscopy/other appropriate test in 10                            years for screening purposes.                           - Patient has a contact number available for                            emergencies. The signs and symptoms of potential                            delayed complications were discussed with the                            patient. Return to normal activities tomorrow.                            Written discharge instructions were provided to the                            patient.                           - Resume previous diet.                           - Continue present medications. Gatha Mayer, MD 11/07/2018 8:50:06 AM This report has been signed electronically.

## 2018-11-07 NOTE — Patient Instructions (Addendum)
The colonoscopy was normal.  Next routine colonoscopy or other screening test in 10 years - 2030  I appreciate the opportunity to care for you. Gatha Mayer, MD, FACG  YOU HAD AN ENDOSCOPIC PROCEDURE TODAY AT [facility]:   Refer to the procedure report that was given to you for any specific questions about what was found during the examination.  If the procedure report does not answer your questions, please call your gastroenterologist to clarify.  If you requested that your care partner not be given the details of your procedure findings, then the procedure report has been included in a sealed envelope for you to review at your convenience later.  YOU SHOULD EXPECT: Some feelings of bloating in the abdomen. Passage of more gas than usual.  Walking can help get rid of the air that was put into your GI tract during the procedure and reduce the bloating. If you had a lower endoscopy (such as a colonoscopy or flexible sigmoidoscopy) you may notice spotting of blood in your stool or on the toilet paper. If you underwent a bowel prep for your procedure, you may not have a normal bowel movement for a few days.  Please Note:  You might notice some irritation and congestion in your nose or some drainage.  This is from the oxygen used during your procedure.  There is no need for concern and it should clear up in a day or so.  SYMPTOMS TO REPORT IMMEDIATELY:   Following lower endoscopy (colonoscopy or flexible sigmoidoscopy):  Excessive amounts of blood in the stool  Significant tenderness or worsening of abdominal pains  Swelling of the abdomen that is new, acute  Fever of 100F or higher  For urgent or emergent issues, a gastroenterologist can be reached at any hour by calling [phone].   DIET:  We do recommend a small meal at first, but then you may proceed to your regular diet.  Drink plenty of fluids but you should avoid alcoholic beverages for 24 hours.  ACTIVITY:   You should plan to take it easy for the rest of today and you should NOT DRIVE or use heavy machinery until tomorrow (because of the sedation medicines used during the test).    FOLLOW UP: Our staff will call the number listed on your records 48-72 hours following your procedure to check on you and address any questions or concerns that you may have regarding the information given to you following your procedure. If we do not reach you, we will leave a message.  We will attempt to reach you two times.  During this call, we will ask if you have developed any symptoms of COVID 19. If you develop any symptoms (ie: fever, flu-like symptoms, shortness of breath, cough etc.) before then, please call [phone].  If you test positive for Covid 19 in the 2 weeks post procedure, please call and report this information to [place].    If any biopsies were taken you will be contacted by phone or by letter within the next 1-3 weeks.  Please call us at [phone] if you have not heard about the biopsies in 3 weeks.    SIGNATURES/CONFIDENTIALITY: You and/or your care partner have signed paperwork which will be entered into your electronic medical record.  These signatures attest to the fact that that the information above on your After Visit Summary has been reviewed and is understood.  Full responsibility of the confidentiality of this discharge information lies with  you and/or your care-partner.

## 2018-11-07 NOTE — Progress Notes (Signed)
To PACU, VSS. Report to [email or web address] 

## 2018-11-07 NOTE — Progress Notes (Signed)
VS done by CW. Temp by Hiram Comber

## 2018-11-11 ENCOUNTER — Telehealth: Payer: Self-pay

## 2018-11-11 NOTE — Telephone Encounter (Signed)
  Follow up Call-  Call back number 11/07/2018  Post procedure Call Back phone  # [phone]  Permission to leave phone message Yes  Some recent data might be hidden     Patient questions:  Do you have a fever, pain , or abdominal swelling? No. Pain Score  0 *  Have you tolerated food without any problems? Yes.    Have you been able to return to your normal activities? Yes.    Do you have any questions about your discharge instructions: Diet   No. Medications  No. Follow up visit  No.  Do you have questions or concerns about your Care? No.  Actions: * If pain score is 4 or above: 1. No action needed, pain <4.Have you developed a fever since your procedure? no  2.   Have you had an respiratory symptoms (SOB or cough) since your procedure? no  3.   Have you tested positive for COVID 19 since your procedure no  4.   Have you had any family members/close contacts diagnosed with the COVID 19 since your procedure?  no   If yes to any of these questions please route to Joylene John, RN and Alphonsa Gin, Therapist, sports.

## 2018-11-15 ENCOUNTER — Other Ambulatory Visit: Payer: Self-pay | Admitting: Internal Medicine

## 2018-11-17 MED FILL — ATENOLOL 50 MG TABLET: 50 | 90 days supply | Qty: 90 | Fill #0

## 2018-11-17 MED FILL — PANTOPRAZOLE SOD DR 40 MG T: 40 | 90 days supply | Qty: 90 | Fill #0

## 2018-11-17 MED FILL — PRAVASTATIN SODIUM 20 MG TA: 20 | 90 days supply | Qty: 90 | Fill #0

## 2018-11-17 MED FILL — TOLTERODINE TART ER 4 MG CA: 4 | 30 days supply | Qty: 30 | Fill #0

## 2018-11-27 ENCOUNTER — Ambulatory Visit (INDEPENDENT_AMBULATORY_CARE_PROVIDER_SITE_OTHER): Payer: 59 | Admitting: Internal Medicine

## 2018-11-27 ENCOUNTER — Other Ambulatory Visit: Payer: Self-pay

## 2018-11-27 ENCOUNTER — Encounter: Payer: Self-pay | Admitting: Internal Medicine

## 2018-11-27 ENCOUNTER — Other Ambulatory Visit (INDEPENDENT_AMBULATORY_CARE_PROVIDER_SITE_OTHER): Payer: 59

## 2018-11-27 VITALS — BP 110/80 | HR 74 | Temp 98.3°F | Ht 68.0 in | Wt 208.0 lb

## 2018-11-27 DIAGNOSIS — I10 Essential (primary) hypertension: Secondary | ICD-10-CM | POA: Diagnosis not present

## 2018-11-27 DIAGNOSIS — Z0000 Encounter for general adult medical examination without abnormal findings: Secondary | ICD-10-CM | POA: Diagnosis not present

## 2018-11-27 DIAGNOSIS — K219 Gastro-esophageal reflux disease without esophagitis: Secondary | ICD-10-CM

## 2018-11-27 DIAGNOSIS — E782 Mixed hyperlipidemia: Secondary | ICD-10-CM

## 2018-11-27 LAB — COMPREHENSIVE METABOLIC PANEL
ALT: 23 U/L (ref 0–35)
AST: 15 U/L (ref 0–37)
Albumin: 4.5 g/dL (ref 3.5–5.2)
Alkaline Phosphatase: 76 U/L (ref 39–117)
BUN: 15 mg/dL (ref 6–23)
CO2: 26 mEq/L (ref 19–32)
Calcium: 9.6 mg/dL (ref 8.4–10.5)
Chloride: 107 mEq/L (ref 96–112)
Creatinine, Ser: 0.85 mg/dL (ref 0.40–1.20)
GFR: 82.58 mL/min (ref >60.00)
Glucose, Bld: 103 mg/dL — ABNORMAL HIGH (ref 70–99)
Potassium: 4 mEq/L (ref 3.5–5.1)
Sodium: 142 mEq/L (ref 135–145)
Total Bilirubin: 0.9 mg/dL (ref 0.2–1.2)
Total Protein: 7 g/dL (ref 6.0–8.3)

## 2018-11-27 LAB — CBC
HCT: 41.8 % (ref 36.0–46.0)
Hemoglobin: 13.7 g/dL (ref 12.0–15.0)
MCHC: 32.7 g/dL (ref 30.0–36.0)
MCV: 77.6 fl — ABNORMAL LOW (ref 78.0–100.0)
Platelets: 342 K/uL (ref 150.0–400.0)
RBC: 5.38 Mil/uL — ABNORMAL HIGH (ref 3.87–5.11)
RDW: 13.9 % (ref 11.5–15.5)
WBC: 5 K/uL (ref 4.0–10.5)

## 2018-11-27 LAB — LIPID PANEL
Cholesterol: 205 mg/dL — ABNORMAL HIGH (ref 0–200)
HDL: 61.3 mg/dL
LDL Cholesterol: 130 mg/dL — ABNORMAL HIGH (ref 0–99)
NonHDL: 143.53
Total CHOL/HDL Ratio: 3
Triglycerides: 66 mg/dL (ref 0.0–149.0)
VLDL: 13.2 mg/dL (ref 0.0–40.0)

## 2018-11-27 LAB — HEMOGLOBIN A1C: Hgb A1c MFr Bld: 5.7 % (ref 4.6–6.5)

## 2018-11-27 NOTE — Patient Instructions (Signed)
Health Maintenance, Female Adopting a healthy lifestyle and getting preventive care are important in promoting health and wellness. Ask your health care provider about:  The right schedule for you to have regular tests and exams.  Things you can do on your own to prevent diseases and keep yourself healthy. What should I know about diet, weight, and exercise? Eat a healthy diet   Eat a diet that includes plenty of vegetables, fruits, low-fat dairy products, and lean protein.  Do not eat a lot of foods that are high in solid fats, added sugars, or sodium. Maintain a healthy weight Body mass index (BMI) is used to identify weight problems. It estimates body fat based on height and weight. Your health care provider can help determine your BMI and help you achieve or maintain a healthy weight. Get regular exercise Get regular exercise. This is one of the most important things you can do for your health. Most adults should:  Exercise for at least 150 minutes each week. The exercise should increase your heart rate and make you sweat (moderate-intensity exercise).  Do strengthening exercises at least twice a week. This is in addition to the moderate-intensity exercise.  Spend less time sitting. Even light physical activity can be beneficial. Watch cholesterol and blood lipids Have your blood tested for lipids and cholesterol at 60 years of age, then have this test every 5 years. Have your cholesterol levels checked more often if:  Your lipid or cholesterol levels are high.  You are older than 60 years of age.  You are at high risk for heart disease. What should I know about cancer screening? Depending on your health history and family history, you may need to have cancer screening at various ages. This may include screening for:  Breast cancer.  Cervical cancer.  Colorectal cancer.  Skin cancer.  Lung cancer. What should I know about heart disease, diabetes, and high blood  pressure? Blood pressure and heart disease  High blood pressure causes heart disease and increases the risk of stroke. This is more likely to develop in people who have high blood pressure readings, are of African descent, or are overweight.  Have your blood pressure checked: ? Every 3-5 years if you are 18-39 years of age. ? Every year if you are 40 years old or older. Diabetes Have regular diabetes screenings. This checks your fasting blood sugar level. Have the screening done:  Once every three years after age 40 if you are at a normal weight and have a low risk for diabetes.  More often and at a younger age if you are overweight or have a high risk for diabetes. What should I know about preventing infection? Hepatitis B If you have a higher risk for hepatitis B, you should be screened for this virus. Talk with your health care provider to find out if you are at risk for hepatitis B infection. Hepatitis C Testing is recommended for:  Everyone born from 1945 through 1965.  Anyone with known risk factors for hepatitis C. Sexually transmitted infections (STIs)  Get screened for STIs, including gonorrhea and chlamydia, if: ? You are sexually active and are younger than 60 years of age. ? You are older than 60 years of age and your health care provider tells you that you are at risk for this type of infection. ? Your sexual activity has changed since you were last screened, and you are at increased risk for chlamydia or gonorrhea. Ask your health care provider if   you are at risk.  Ask your health care provider about whether you are at high risk for HIV. Your health care provider may recommend a prescription medicine to help prevent HIV infection. If you choose to take medicine to prevent HIV, you should first get tested for HIV. You should then be tested every 3 months for as long as you are taking the medicine. Pregnancy  If you are about to stop having your period (premenopausal) and  you may become pregnant, seek counseling before you get pregnant.  Take 400 to 800 micrograms (mcg) of folic acid every day if you become pregnant.  Ask for birth control (contraception) if you want to prevent pregnancy. Osteoporosis and menopause Osteoporosis is a disease in which the bones lose minerals and strength with aging. This can result in bone fractures. If you are 65 years old or older, or if you are at risk for osteoporosis and fractures, ask your health care provider if you should:  Be screened for bone loss.  Take a calcium or vitamin D supplement to lower your risk of fractures.  Be given hormone replacement therapy (HRT) to treat symptoms of menopause. Follow these instructions at home: Lifestyle  Do not use any products that contain nicotine or tobacco, such as cigarettes, e-cigarettes, and chewing tobacco. If you need help quitting, ask your health care provider.  Do not use street drugs.  Do not share needles.  Ask your health care provider for help if you need support or information about quitting drugs. Alcohol use  Do not drink alcohol if: ? Your health care provider tells you not to drink. ? You are pregnant, may be pregnant, or are planning to become pregnant.  If you drink alcohol: ? Limit how much you use to 0-1 drink a day. ? Limit intake if you are breastfeeding.  Be aware of how much alcohol is in your drink. In the [place] one drink equals one 12 oz bottle of beer (355 mL), one 5 oz glass of wine (148 mL), or one 1 oz glass of hard liquor (44 mL). General instructions  Schedule regular health, dental, and eye exams.  Stay current with your vaccines.  Tell your health care provider if: ? You often feel depressed. ? You have ever been abused or do not feel safe at home. Summary  Adopting a healthy lifestyle and getting preventive care are important in promoting health and wellness.  Follow your health care provider's instructions about healthy  diet, exercising, and getting tested or screened for diseases.  Follow your health care provider's instructions on monitoring your cholesterol and blood pressure. This information is not intended to replace advice given to you by your health care provider. Make sure you discuss any questions you have with your health care provider. Document Released: 07/10/2010 Document Revised: 12/18/2017 Document Reviewed: 12/18/2017 Elsevier Patient Education  2020 Elsevier Inc.  

## 2018-11-27 NOTE — Assessment & Plan Note (Signed)
BP at goal on atenolol. Checking CMP and adjust as needed.  

## 2018-11-27 NOTE — Assessment & Plan Note (Signed)
Flu shot up to date. Shingrix complete. Tetanus up to date. Colonoscopy up to date. Mammogram getting in a few weeks, pap smear up to date. Counseled about sun safety and mole surveillance. Counseled about the dangers of distracted driving. Given 10 year screening recommendations.

## 2018-11-27 NOTE — Assessment & Plan Note (Signed)
Taking protonix daily.  

## 2018-11-27 NOTE — Progress Notes (Signed)
   Subjective:   Patient ID: [name], female    DOB: 20-Feb-1960, 58 y.o.   MRN: [id number]  HPI The patient is a 58 YO female coming in for physical.   PMH, [place], social history reviewed and updated  Review of Systems  Constitutional: Negative.   HENT: Negative.   Eyes: Negative.   Respiratory: Negative for cough, chest tightness and shortness of breath.   Cardiovascular: Negative for chest pain, palpitations and leg swelling.  Gastrointestinal: Negative for abdominal distention, abdominal pain, constipation, diarrhea, nausea and vomiting.  Musculoskeletal: Negative.   Skin: Negative.   Neurological: Negative.   Psychiatric/Behavioral: Negative.     Objective:  Physical Exam Constitutional:      Appearance: She is well-developed.  HENT:     Head: Normocephalic and atraumatic.  Neck:     Musculoskeletal: Normal range of motion.  Cardiovascular:     Rate and Rhythm: Normal rate and regular rhythm.  Pulmonary:     Effort: Pulmonary effort is normal. No respiratory distress.     Breath sounds: Normal breath sounds. No wheezing or rales.  Abdominal:     General: Bowel sounds are normal. There is no distension.     Palpations: Abdomen is soft.     Tenderness: There is no abdominal tenderness. There is no rebound.  Skin:    General: Skin is warm and dry.  Neurological:     Mental Status: She is alert and oriented to person, place, and time.     Coordination: Coordination normal.     Vitals:   11/27/18 0823  BP: 110/80  Pulse: 74  Temp: 98.3 F (36.8 C)  TempSrc: Oral  SpO2: 98%  Weight: 208 lb (94.3 kg)  Height: 5\' 8"  (1.727 m)    Assessment & Plan:

## 2018-11-27 NOTE — Assessment & Plan Note (Signed)
Checking lipid panel and adjust pravastatin as needed.  

## 2018-12-15 MED FILL — TOLTERODINE TART ER 4 MG CA: 4 | 30 days supply | Qty: 30 | Fill #0

## 2019-01-14 DIAGNOSIS — Z130 Encounter for screening for diseases of the blood and blood-forming organs and certain disorders involving the immune mechanism: Secondary | ICD-10-CM | POA: Diagnosis not present

## 2019-01-14 DIAGNOSIS — Z1151 Encounter for screening for human papillomavirus (HPV): Secondary | ICD-10-CM | POA: Diagnosis not present

## 2019-01-14 DIAGNOSIS — Z01419 Encounter for gynecological examination (general) (routine) without abnormal findings: Secondary | ICD-10-CM | POA: Diagnosis not present

## 2019-01-14 DIAGNOSIS — Z1389 Encounter for screening for other disorder: Secondary | ICD-10-CM | POA: Diagnosis not present

## 2019-01-14 DIAGNOSIS — Z6833 Body mass index (BMI) 33.0-33.9, adult: Secondary | ICD-10-CM | POA: Diagnosis not present

## 2019-01-14 DIAGNOSIS — Z124 Encounter for screening for malignant neoplasm of cervix: Secondary | ICD-10-CM | POA: Diagnosis not present

## 2019-01-14 DIAGNOSIS — Z1231 Encounter for screening mammogram for malignant neoplasm of breast: Secondary | ICD-10-CM | POA: Diagnosis not present

## 2019-01-19 MED FILL — TOLTERODINE TARTRATE ER 4 M: 4 | 30 days supply | Qty: 30 | Fill #0

## 2019-02-02 MED FILL — FLUTICASONE PROP 50 MCG SPR: 50 | 30 days supply | Qty: 16 | Fill #3

## 2019-02-13 ENCOUNTER — Other Ambulatory Visit: Payer: Self-pay | Admitting: Internal Medicine

## 2019-02-13 MED FILL — TOLTERODINE TARTRATE ER 4 M: 4 | 30 days supply | Qty: 30 | Fill #0

## 2019-02-13 MED FILL — ATENOLOL 50 MG TABLET: 50 | 90 days supply | Qty: 90 | Fill #0

## 2019-02-13 MED FILL — PANTOPRAZOLE SOD DR 40 MG T: 40 | 90 days supply | Qty: 90 | Fill #0

## 2019-02-13 MED FILL — PRAVASTATIN SODIUM 20 MG TA: 20 | 90 days supply | Qty: 90 | Fill #0

## 2019-03-11 MED FILL — TOLTERODINE TARTRATE ER 4 M: 4 | 30 days supply | Qty: 30 | Fill #1

## 2019-03-21 MED FILL — TOLTERODINE TARTRATE ER 4 M: 4 | 30 days supply | Qty: 30 | Fill #1

## 2019-04-16 MED FILL — TOLTERODINE TARTRATE ER 4 M: 4 | 30 days supply | Qty: 30 | Fill #2

## 2019-05-14 ENCOUNTER — Other Ambulatory Visit: Payer: Self-pay | Admitting: Internal Medicine

## 2019-05-14 MED FILL — PRAVASTATIN SODIUM 20 MG TA: 20 | 90 days supply | Qty: 90 | Fill #1

## 2019-05-14 MED FILL — TOLTERODINE TARTRATE ER 4 M: 4 | 30 days supply | Qty: 30 | Fill #3

## 2019-05-14 MED FILL — ATENOLOL 50 MG TABLET: 50 | 90 days supply | Qty: 90 | Fill #1

## 2019-05-14 MED FILL — PANTOPRAZOLE SOD DR 40 MG T: 40 | 90 days supply | Qty: 90 | Fill #1

## 2019-05-14 MED FILL — FLUTICASONE PROP 50 MCG SPR: 50 | 30 days supply | Qty: 16 | Fill #0

## 2019-05-25 DIAGNOSIS — N390 Urinary tract infection, site not specified: Secondary | ICD-10-CM | POA: Diagnosis not present

## 2019-05-25 DIAGNOSIS — R309 Painful micturition, unspecified: Secondary | ICD-10-CM | POA: Diagnosis not present

## 2019-05-25 DIAGNOSIS — N898 Other specified noninflammatory disorders of vagina: Secondary | ICD-10-CM | POA: Diagnosis not present

## 2019-05-25 MED FILL — NITROFURANTOIN MONO-MCR 100: 100 | 7 days supply | Qty: 14 | Fill #0

## 2019-06-13 MED FILL — TOLTERODINE TARTRATE ER 4 M: 4 | 30 days supply | Qty: 30 | Fill #4

## 2019-07-17 MED FILL — TOLTERODINE TARTRATE ER 4 M: 4 | 30 days supply | Qty: 30 | Fill #5

## 2019-07-31 ENCOUNTER — Ambulatory Visit: Payer: Self-pay

## 2019-07-31 ENCOUNTER — Ambulatory Visit: Payer: 59 | Admitting: Family Medicine

## 2019-07-31 ENCOUNTER — Encounter: Payer: Self-pay | Admitting: Family Medicine

## 2019-07-31 ENCOUNTER — Other Ambulatory Visit: Payer: Self-pay

## 2019-07-31 VITALS — BP 136/92 | HR 77 | Ht 68.0 in | Wt 215.0 lb

## 2019-07-31 DIAGNOSIS — M1712 Unilateral primary osteoarthritis, left knee: Secondary | ICD-10-CM

## 2019-07-31 DIAGNOSIS — M25562 Pain in left knee: Secondary | ICD-10-CM | POA: Diagnosis not present

## 2019-07-31 DIAGNOSIS — G8929 Other chronic pain: Secondary | ICD-10-CM | POA: Diagnosis not present

## 2019-07-31 NOTE — Patient Instructions (Signed)
Good to see you We will get you approved for gel See me again in 6 weeks 

## 2019-07-31 NOTE — Assessment & Plan Note (Signed)
Injection given today, tolerated procedure well, discussed recommendations with exercise, increase activity slowly.  Discussed icing regimen.  Could be a candidate for viscosupplementation.  Continue to provide topical anti-inflammatories.  Follow-up again in 6 weeks
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
## 2019-08-21 ENCOUNTER — Other Ambulatory Visit (HOSPITAL_COMMUNITY): Payer: Self-pay | Admitting: Obstetrics and Gynecology

## 2019-08-21 MED FILL — PANTOPRAZOLE SOD DR 40 MG T: 40 | 90 days supply | Qty: 90 | Fill #2

## 2019-08-21 MED FILL — TOLTERODINE TARTRATE ER 4 M: 4 | 30 days supply | Qty: 30 | Fill #0

## 2019-08-21 MED FILL — PRAVASTATIN SODIUM 20 MG TA: 20 | 90 days supply | Qty: 90 | Fill #2

## 2019-08-21 MED FILL — ATENOLOL 50 MG TABLET: 50 | 90 days supply | Qty: 90 | Fill #2
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
## 2019-09-10 ENCOUNTER — Other Ambulatory Visit: Payer: Self-pay

## 2019-09-10 ENCOUNTER — Ambulatory Visit (INDEPENDENT_AMBULATORY_CARE_PROVIDER_SITE_OTHER): Payer: 59

## 2019-09-10 ENCOUNTER — Ambulatory Visit: Payer: Self-pay

## 2019-09-10 ENCOUNTER — Ambulatory Visit: Payer: 59 | Admitting: Family Medicine

## 2019-09-10 VITALS — BP 122/84 | HR 75 | Ht 68.0 in | Wt 218.0 lb

## 2019-09-10 DIAGNOSIS — G8929 Other chronic pain: Secondary | ICD-10-CM

## 2019-09-10 DIAGNOSIS — M1712 Unilateral primary osteoarthritis, left knee: Secondary | ICD-10-CM | POA: Diagnosis not present

## 2019-09-10 DIAGNOSIS — M25562 Pain in left knee: Secondary | ICD-10-CM | POA: Diagnosis not present

## 2019-09-10 NOTE — Assessment & Plan Note (Signed)
Patient has what appears to be significant arthritic changes of the patellofemoral joint, repeat x-rays ordered today again to to further evaluate.  Patient was viscosupplementation and we will see how patient responds well to the gel injection.  Worsening symptoms will need to consider the possibility of PRP or if surprisingly only moderate arthritic changes then will consider MRI but I think that will not be necessary.  Patient does not have any significant instability at the moment today but likely with the kneecap and will need to consider bracing more of the last surgical intervention follow-up in 5 weeks

## 2019-09-10 NOTE — Patient Instructions (Signed)
See me again in 5 weeks Xray on your way out

## 2019-09-11 ENCOUNTER — Encounter: Payer: Self-pay | Admitting: Family Medicine

## 2019-09-21 MED FILL — TOLTERODINE TARTRATE ER 4 M: 4 | 30 days supply | Qty: 30 | Fill #1

## 2019-10-03 DIAGNOSIS — M25561 Pain in right knee: Secondary | ICD-10-CM | POA: Diagnosis not present

## 2019-10-17 MED FILL — TOLTERODINE TARTRATE ER 4 M: 4 | 30 days supply | Qty: 30 | Fill #2

## 2019-10-20 ENCOUNTER — Ambulatory Visit: Payer: 59 | Admitting: Family Medicine

## 2019-11-14 MED FILL — PRAVASTATIN SODIUM 20 MG TA: 20 | 90 days supply | Qty: 90 | Fill #3

## 2019-11-14 MED FILL — TOLTERODINE TARTRATE ER 4 M: 4 | 30 days supply | Qty: 30 | Fill #3

## 2019-11-14 MED FILL — PANTOPRAZOLE SOD DR 40 MG T: 40 | 90 days supply | Qty: 90 | Fill #3

## 2019-11-14 MED FILL — ATENOLOL 50 MG TABLET: 50 | 90 days supply | Qty: 90 | Fill #3

## 2019-11-14 MED FILL — FLUTICASONE PROP 50 MCG SPR: 50 | 30 days supply | Qty: 16 | Fill #1

## 2019-11-18 ENCOUNTER — Other Ambulatory Visit (HOSPITAL_COMMUNITY): Payer: Self-pay | Admitting: Physician Assistant

## 2019-11-18 DIAGNOSIS — S83272A Complex tear of lateral meniscus, current injury, left knee, initial encounter: Secondary | ICD-10-CM | POA: Diagnosis not present

## 2019-11-18 DIAGNOSIS — M6752 Plica syndrome, left knee: Secondary | ICD-10-CM | POA: Diagnosis not present

## 2019-11-18 DIAGNOSIS — M94262 Chondromalacia, left knee: Secondary | ICD-10-CM | POA: Diagnosis not present

## 2019-11-18 DIAGNOSIS — G8918 Other acute postprocedural pain: Secondary | ICD-10-CM | POA: Diagnosis not present

## 2019-11-18 DIAGNOSIS — M659 Synovitis and tenosynovitis, unspecified: Secondary | ICD-10-CM | POA: Diagnosis not present

## 2019-11-18 HISTORY — PX: MENISCUS REPAIR: SHX5179

## 2019-11-18 MED FILL — HYDROCODON-APAP 7.5-325: 7.5-325 | 7 days supply | Qty: 28 | Fill #0

## 2019-11-26 DIAGNOSIS — M25562 Pain in left knee: Secondary | ICD-10-CM | POA: Diagnosis not present

## 2019-11-26 DIAGNOSIS — Z9889 Other specified postprocedural states: Secondary | ICD-10-CM | POA: Diagnosis not present

## 2019-11-26 DIAGNOSIS — R2689 Other abnormalities of gait and mobility: Secondary | ICD-10-CM | POA: Diagnosis not present

## 2019-11-26 DIAGNOSIS — M6281 Muscle weakness (generalized): Secondary | ICD-10-CM | POA: Diagnosis not present

## 2019-12-01 ENCOUNTER — Other Ambulatory Visit: Payer: Self-pay

## 2019-12-01 ENCOUNTER — Ambulatory Visit (INDEPENDENT_AMBULATORY_CARE_PROVIDER_SITE_OTHER): Payer: 59 | Admitting: Internal Medicine

## 2019-12-01 ENCOUNTER — Encounter: Payer: Self-pay | Admitting: Internal Medicine

## 2019-12-01 VITALS — BP 124/80 | HR 75 | Temp 98.9°F | Ht 68.0 in | Wt 213.0 lb

## 2019-12-01 DIAGNOSIS — I10 Essential (primary) hypertension: Secondary | ICD-10-CM | POA: Diagnosis not present

## 2019-12-01 DIAGNOSIS — K219 Gastro-esophageal reflux disease without esophagitis: Secondary | ICD-10-CM

## 2019-12-01 DIAGNOSIS — R2689 Other abnormalities of gait and mobility: Secondary | ICD-10-CM | POA: Diagnosis not present

## 2019-12-01 DIAGNOSIS — M25562 Pain in left knee: Secondary | ICD-10-CM | POA: Diagnosis not present

## 2019-12-01 DIAGNOSIS — Z0000 Encounter for general adult medical examination without abnormal findings: Secondary | ICD-10-CM

## 2019-12-01 DIAGNOSIS — E782 Mixed hyperlipidemia: Secondary | ICD-10-CM | POA: Diagnosis not present

## 2019-12-01 DIAGNOSIS — M6281 Muscle weakness (generalized): Secondary | ICD-10-CM | POA: Diagnosis not present

## 2019-12-01 DIAGNOSIS — Z9889 Other specified postprocedural states: Secondary | ICD-10-CM | POA: Diagnosis not present

## 2019-12-01 LAB — COMPREHENSIVE METABOLIC PANEL
ALT: 25 U/L (ref 0–35)
AST: 16 U/L (ref 0–37)
Albumin: 4.5 g/dL (ref 3.5–5.2)
Alkaline Phosphatase: 74 U/L (ref 39–117)
BUN: 18 mg/dL (ref 6–23)
CO2: 30 mEq/L (ref 19–32)
Calcium: 9.8 mg/dL (ref 8.4–10.5)
Chloride: 103 mEq/L (ref 96–112)
Creatinine, Ser: 0.98 mg/dL (ref 0.40–1.20)
GFR: 62.56 mL/min (ref >60.00)
Glucose, Bld: 106 mg/dL — ABNORMAL HIGH (ref 70–99)
Potassium: 4.1 mEq/L (ref 3.5–5.1)
Sodium: 139 mEq/L (ref 135–145)
Total Bilirubin: 0.6 mg/dL (ref 0.2–1.2)
Total Protein: 7.2 g/dL (ref 6.0–8.3)

## 2019-12-01 LAB — CBC
HCT: 41.3 % (ref 36.0–46.0)
Hemoglobin: 13.6 g/dL (ref 12.0–15.0)
MCHC: 32.9 g/dL (ref 30.0–36.0)
MCV: 77.5 fl — ABNORMAL LOW (ref 78.0–100.0)
Platelets: 405 10*3/uL — ABNORMAL HIGH (ref 150.0–400.0)
RBC: 5.33 Mil/uL — ABNORMAL HIGH (ref 3.87–5.11)
RDW: 14 % (ref 11.5–15.5)
WBC: 5.9 10*3/uL (ref 4.0–10.5)

## 2019-12-01 LAB — LIPID PANEL
Cholesterol: 195 mg/dL (ref 0–200)
HDL: 60.4 mg/dL (ref >39.00)
LDL Cholesterol: 120 mg/dL — ABNORMAL HIGH (ref 0–99)
NonHDL: 134.68
Total CHOL/HDL Ratio: 3
Triglycerides: 74 mg/dL (ref 0.0–149.0)
VLDL: 14.8 mg/dL (ref 0.0–40.0)

## 2019-12-01 LAB — HEMOGLOBIN A1C: Hgb A1c MFr Bld: 5.8 % (ref 4.6–6.5)

## 2019-12-01 NOTE — Assessment & Plan Note (Signed)
BP at goal on atenolol. Checking CMP and adjust as needed. HR good.

## 2019-12-01 NOTE — Assessment & Plan Note (Signed)
CV risk 6% based on 2020 cholesterol. LDL goal 130 and checking lipid panel. Adjust pravastatin 20 mg daily as needed.

## 2019-12-01 NOTE — Progress Notes (Signed)
   Subjective:   Patient ID: [name], female    DOB: Feb 05, 1960, 59 y.o.   MRN: [id number]  HPI The patient is a 59 YO female coming in for physical. Still recovering from recent knee surgery.   PMH, [facility], social history reviewed and updated  Review of Systems  Constitutional: Positive for activity change.  HENT: Negative.   Eyes: Negative.   Respiratory: Negative for cough, chest tightness and shortness of breath.   Cardiovascular: Negative for chest pain, palpitations and leg swelling.  Gastrointestinal: Negative for abdominal distention, abdominal pain, constipation, diarrhea, nausea and vomiting.  Musculoskeletal: Positive for myalgias.  Skin: Negative.   Neurological: Negative.   Psychiatric/Behavioral: Negative.     Objective:  Physical Exam Constitutional:      Appearance: She is well-developed.  HENT:     Head: Normocephalic and atraumatic.  Cardiovascular:     Rate and Rhythm: Normal rate and regular rhythm.  Pulmonary:     Effort: Pulmonary effort is normal. No respiratory distress.     Breath sounds: Normal breath sounds. No wheezing or rales.  Abdominal:     General: Bowel sounds are normal. There is no distension.     Palpations: Abdomen is soft.     Tenderness: There is no abdominal tenderness. There is no rebound.  Musculoskeletal:     Cervical back: Normal range of motion.  Skin:    General: Skin is warm and dry.  Neurological:     Mental Status: She is alert and oriented to person, place, and time.     Coordination: Coordination normal.     Vitals:   12/01/19 0805  BP: 124/80  Pulse: 75  Temp: 98.9 F (37.2 C)  TempSrc: Oral  SpO2: 98%  Weight: 213 lb (96.6 kg)  Height: 5\' 8"  (1.727 m)    This visit occurred during the SARS-CoV-2 public health emergency.  Safety protocols were in place, including screening questions prior to the visit, additional usage of staff PPE, and extensive cleaning of exam room while observing appropriate  contact time as indicated for disinfecting solutions.   Assessment & Plan:

## 2019-12-01 NOTE — Assessment & Plan Note (Signed)
Flu shot up to date. Covid-19 up to date including booster she will send dates for 1st 2 shots. Shingrix complete. Tetanus due 2024. Colonoscopy due 2030. Mammogram up to date with gyn, pap smear not indicated. Counseled about sun safety and mole surveillance. Counseled about the dangers of distracted driving. Given 10 year screening recommendations.

## 2019-12-01 NOTE — Patient Instructions (Signed)
Health Maintenance, Female Adopting a healthy lifestyle and getting preventive care are important in promoting health and wellness. Ask your health care provider about:  The right schedule for you to have regular tests and exams.  Things you can do on your own to prevent diseases and keep yourself healthy. What should I know about diet, weight, and exercise? Eat a healthy diet   Eat a diet that includes plenty of vegetables, fruits, low-fat dairy products, and lean protein.  Do not eat a lot of foods that are high in solid fats, added sugars, or sodium. Maintain a healthy weight Body mass index (BMI) is used to identify weight problems. It estimates body fat based on height and weight. Your health care provider can help determine your BMI and help you achieve or maintain a healthy weight. Get regular exercise Get regular exercise. This is one of the most important things you can do for your health. Most adults should:  Exercise for at least 150 minutes each week. The exercise should increase your heart rate and make you sweat (moderate-intensity exercise).  Do strengthening exercises at least twice a week. This is in addition to the moderate-intensity exercise.  Spend less time sitting. Even light physical activity can be beneficial. Watch cholesterol and blood lipids Have your blood tested for lipids and cholesterol at 61 years of age, then have this test every 5 years. Have your cholesterol levels checked more often if:  Your lipid or cholesterol levels are high.  You are older than 61 years of age.  You are at high risk for heart disease. What should I know about cancer screening? Depending on your health history and family history, you may need to have cancer screening at various ages. This may include screening for:  Breast cancer.  Cervical cancer.  Colorectal cancer.  Skin cancer.  Lung cancer. What should I know about heart disease, diabetes, and high blood  pressure? Blood pressure and heart disease  High blood pressure causes heart disease and increases the risk of stroke. This is more likely to develop in people who have high blood pressure readings, are of African descent, or are overweight.  Have your blood pressure checked: ? Every 3-5 years if you are 18-39 years of age. ? Every year if you are 40 years old or older. Diabetes Have regular diabetes screenings. This checks your fasting blood sugar level. Have the screening done:  Once every three years after age 40 if you are at a normal weight and have a low risk for diabetes.  More often and at a younger age if you are overweight or have a high risk for diabetes. What should I know about preventing infection? Hepatitis B If you have a higher risk for hepatitis B, you should be screened for this virus. Talk with your health care provider to find out if you are at risk for hepatitis B infection. Hepatitis C Testing is recommended for:  Everyone born from 1945 through 1965.  Anyone with known risk factors for hepatitis C. Sexually transmitted infections (STIs)  Get screened for STIs, including gonorrhea and chlamydia, if: ? You are sexually active and are younger than 61 years of age. ? You are older than 61 years of age and your health care provider tells you that you are at risk for this type of infection. ? Your sexual activity has changed since you were last screened, and you are at increased risk for chlamydia or gonorrhea. Ask your health care provider if   you are at risk.  Ask your health care provider about whether you are at high risk for HIV. Your health care provider may recommend a prescription medicine to help prevent HIV infection. If you choose to take medicine to prevent HIV, you should first get tested for HIV. You should then be tested every 3 months for as long as you are taking the medicine. Pregnancy  If you are about to stop having your period (premenopausal) and  you may become pregnant, seek counseling before you get pregnant.  Take 400 to 800 micrograms (mcg) of folic acid every day if you become pregnant.  Ask for birth control (contraception) if you want to prevent pregnancy. Osteoporosis and menopause Osteoporosis is a disease in which the bones lose minerals and strength with aging. This can result in bone fractures. If you are 65 years old or older, or if you are at risk for osteoporosis and fractures, ask your health care provider if you should:  Be screened for bone loss.  Take a calcium or vitamin D supplement to lower your risk of fractures.  Be given hormone replacement therapy (HRT) to treat symptoms of menopause. Follow these instructions at home: Lifestyle  Do not use any products that contain nicotine or tobacco, such as cigarettes, e-cigarettes, and chewing tobacco. If you need help quitting, ask your health care provider.  Do not use street drugs.  Do not share needles.  Ask your health care provider for help if you need support or information about quitting drugs. Alcohol use  Do not drink alcohol if: ? Your health care provider tells you not to drink. ? You are pregnant, may be pregnant, or are planning to become pregnant.  If you drink alcohol: ? Limit how much you use to 0-1 drink a day. ? Limit intake if you are breastfeeding.  Be aware of how much alcohol is in your drink. In the [place] one drink equals one 12 oz bottle of beer (355 mL), one 5 oz glass of wine (148 mL), or one 1 oz glass of hard liquor (44 mL). General instructions  Schedule regular health, dental, and eye exams.  Stay current with your vaccines.  Tell your health care provider if: ? You often feel depressed. ? You have ever been abused or do not feel safe at home. Summary  Adopting a healthy lifestyle and getting preventive care are important in promoting health and wellness.  Follow your health care provider's instructions about healthy  diet, exercising, and getting tested or screened for diseases.  Follow your health care provider's instructions on monitoring your cholesterol and blood pressure. This information is not intended to replace advice given to you by your health care provider. Make sure you discuss any questions you have with your health care provider. Document Revised: 12/18/2017 Document Reviewed: 12/18/2017 Elsevier Patient Education  2020 [employer]  

## 2019-12-01 NOTE — Assessment & Plan Note (Signed)
Taking protonix 40 mg daily and continue.

## 2019-12-02 ENCOUNTER — Encounter: Payer: Self-pay | Admitting: Internal Medicine

## 2019-12-08 DIAGNOSIS — R2689 Other abnormalities of gait and mobility: Secondary | ICD-10-CM | POA: Diagnosis not present

## 2019-12-08 DIAGNOSIS — M6281 Muscle weakness (generalized): Secondary | ICD-10-CM | POA: Diagnosis not present

## 2019-12-08 DIAGNOSIS — Z9889 Other specified postprocedural states: Secondary | ICD-10-CM | POA: Diagnosis not present

## 2019-12-08 DIAGNOSIS — M25562 Pain in left knee: Secondary | ICD-10-CM | POA: Diagnosis not present

## 2019-12-15 DIAGNOSIS — Z9889 Other specified postprocedural states: Secondary | ICD-10-CM | POA: Diagnosis not present

## 2019-12-15 DIAGNOSIS — R2689 Other abnormalities of gait and mobility: Secondary | ICD-10-CM | POA: Diagnosis not present

## 2019-12-15 DIAGNOSIS — M25562 Pain in left knee: Secondary | ICD-10-CM | POA: Diagnosis not present

## 2019-12-15 DIAGNOSIS — M6281 Muscle weakness (generalized): Secondary | ICD-10-CM | POA: Diagnosis not present

## 2019-12-17 DIAGNOSIS — M6281 Muscle weakness (generalized): Secondary | ICD-10-CM | POA: Diagnosis not present

## 2019-12-17 DIAGNOSIS — M25562 Pain in left knee: Secondary | ICD-10-CM | POA: Diagnosis not present

## 2019-12-17 DIAGNOSIS — Z9889 Other specified postprocedural states: Secondary | ICD-10-CM | POA: Diagnosis not present

## 2019-12-17 DIAGNOSIS — R2689 Other abnormalities of gait and mobility: Secondary | ICD-10-CM | POA: Diagnosis not present

## 2019-12-19 MED FILL — TOLTERODINE TARTRATE ER 4 M: 4 | 30 days supply | Qty: 30 | Fill #4

## 2020-01-18 MED FILL — FLUTICASONE PROP 50 MCG SPR: 50 | 30 days supply | Qty: 16 | Fill #2

## 2020-01-18 MED FILL — TOLTERODINE TARTRATE ER 4 M: 4 | 30 days supply | Qty: 30 | Fill #5

## 2020-02-02 ENCOUNTER — Ambulatory Visit (INDEPENDENT_AMBULATORY_CARE_PROVIDER_SITE_OTHER): Payer: 59

## 2020-02-02 ENCOUNTER — Other Ambulatory Visit: Payer: Self-pay

## 2020-02-02 ENCOUNTER — Encounter: Payer: Self-pay | Admitting: Internal Medicine

## 2020-02-02 ENCOUNTER — Ambulatory Visit: Payer: 59 | Admitting: Internal Medicine

## 2020-02-02 VITALS — BP 134/88 | HR 88 | Temp 99.1°F | Resp 18 | Ht 65.0 in | Wt 215.6 lb

## 2020-02-02 DIAGNOSIS — M546 Pain in thoracic spine: Secondary | ICD-10-CM

## 2020-02-02 MED ORDER — KETOROLAC TROMETHAMINE 30 MG/ML IJ SOLN
30.0000 mg | Freq: Once | INTRAMUSCULAR | Status: AC
  Administered 2020-02-02: 30 mg via INTRAMUSCULAR

## 2020-02-02 NOTE — Patient Instructions (Signed)
We have given you toradol for the pain in the neck and are checking an x-ray.

## 2020-02-02 NOTE — Assessment & Plan Note (Signed)
Checking x-ray and given toradol 30 mg IM today at visit.

## 2020-02-02 NOTE — Progress Notes (Signed)
   Subjective:   Patient ID: [name], female    DOB: August 11, 1960, 60 y.o.   MRN: [id number]  HPI The patient is a 60 YO female coming in for concerns about middle back pain. Started around the holidays so she thought she maybe pulled something with decorations etc. Does not recall specific injury. Denies numbness or weakness in arms or legs. Does have pain in the shoulder blade region and upper back. Has tried otc lidocaine patches which help temporarily. Overall mild improvement but not significant enough. Pain 3-4/10. Present throughout the day and no change with exertion. No increase in pain with deep breathing.    Review of Systems  Constitutional: Negative.   HENT: Negative.   Eyes: Negative.   Respiratory: Negative for cough, chest tightness and shortness of breath.   Cardiovascular: Negative for chest pain, palpitations and leg swelling.  Gastrointestinal: Negative for abdominal distention, abdominal pain, constipation, diarrhea, nausea and vomiting.  Musculoskeletal: Positive for myalgias.  Skin: Negative.   Neurological: Negative.   Psychiatric/Behavioral: Negative.     Objective:  Physical Exam Constitutional:      Appearance: She is well-developed and well-nourished.  HENT:     Head: Normocephalic and atraumatic.  Eyes:     Extraocular Movements: EOM normal.  Cardiovascular:     Rate and Rhythm: Normal rate and regular rhythm.  Pulmonary:     Effort: Pulmonary effort is normal. No respiratory distress.     Breath sounds: Normal breath sounds. No wheezing or rales.  Abdominal:     General: Bowel sounds are normal. There is no distension.     Palpations: Abdomen is soft.     Tenderness: There is no abdominal tenderness. There is no rebound.  Musculoskeletal:        General: Tenderness present. No edema.     Cervical back: Normal range of motion.     Comments: Tenderness thoracic region mostly paraspinally  Skin:    General: Skin is warm and dry.   Neurological:     Mental Status: She is alert and oriented to person, place, and time.     Coordination: Coordination normal.  Psychiatric:        Mood and Affect: Mood and affect normal.     Vitals:   02/02/20 1037  BP: 134/88  Pulse: 88  Resp: 18  Temp: 99.1 F (37.3 C)  TempSrc: Oral  SpO2: 100%  Weight: 215 lb 9.6 oz (97.8 kg)  Height: 5\' 5"  (1.651 m)    This visit occurred during the SARS-CoV-2 public health emergency.  Safety protocols were in place, including screening questions prior to the visit, additional usage of staff PPE, and extensive cleaning of exam room while observing appropriate contact time as indicated for disinfecting solutions.   Assessment & Plan:  Toradol 30 mg IM given at visit

## 2020-02-04 ENCOUNTER — Other Ambulatory Visit: Payer: Self-pay | Admitting: Family Medicine

## 2020-02-15 ENCOUNTER — Other Ambulatory Visit: Payer: Self-pay | Admitting: Internal Medicine

## 2020-02-15 MED FILL — PRAVASTATIN SODIUM 20 MG TA: 20 | 90 days supply | Qty: 90 | Fill #0

## 2020-02-15 MED FILL — TOLTERODINE TARTRATE ER 4 M: 4 | 30 days supply | Qty: 30 | Fill #0

## 2020-02-15 MED FILL — PANTOPRAZOLE SOD DR 40 MG T: 40 | 90 days supply | Qty: 90 | Fill #0

## 2020-02-15 MED FILL — ATENOLOL 50 MG TABLET: 50 | 90 days supply | Qty: 90 | Fill #0

## 2020-02-23 DIAGNOSIS — Z6833 Body mass index (BMI) 33.0-33.9, adult: Secondary | ICD-10-CM | POA: Diagnosis not present

## 2020-02-23 DIAGNOSIS — Z1231 Encounter for screening mammogram for malignant neoplasm of breast: Secondary | ICD-10-CM | POA: Diagnosis not present

## 2020-02-23 DIAGNOSIS — Z01419 Encounter for gynecological examination (general) (routine) without abnormal findings: Secondary | ICD-10-CM | POA: Diagnosis not present

## 2020-02-23 DIAGNOSIS — Z1389 Encounter for screening for other disorder: Secondary | ICD-10-CM | POA: Diagnosis not present

## 2020-02-23 DIAGNOSIS — Z130 Encounter for screening for diseases of the blood and blood-forming organs and certain disorders involving the immune mechanism: Secondary | ICD-10-CM | POA: Diagnosis not present
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
## 2020-03-09 ENCOUNTER — Ambulatory Visit (INDEPENDENT_AMBULATORY_CARE_PROVIDER_SITE_OTHER): Payer: 59

## 2020-03-09 ENCOUNTER — Other Ambulatory Visit: Payer: Self-pay

## 2020-03-09 ENCOUNTER — Encounter: Payer: Self-pay | Admitting: Family Medicine

## 2020-03-09 ENCOUNTER — Other Ambulatory Visit: Payer: Self-pay | Admitting: Family Medicine

## 2020-03-09 ENCOUNTER — Ambulatory Visit: Payer: 59 | Admitting: Family Medicine

## 2020-03-09 VITALS — BP 140/90 | HR 86 | Ht 65.0 in | Wt 217.0 lb

## 2020-03-09 DIAGNOSIS — G8929 Other chronic pain: Secondary | ICD-10-CM | POA: Diagnosis not present

## 2020-03-09 DIAGNOSIS — G5702 Lesion of sciatic nerve, left lower limb: Secondary | ICD-10-CM | POA: Insufficient documentation

## 2020-03-09 DIAGNOSIS — M545 Low back pain, unspecified: Secondary | ICD-10-CM

## 2020-03-09 DIAGNOSIS — M999 Biomechanical lesion, unspecified: Secondary | ICD-10-CM

## 2020-03-09 DIAGNOSIS — M546 Pain in thoracic spine: Secondary | ICD-10-CM | POA: Diagnosis not present

## 2020-03-09 MED ORDER — PREDNISONE 20 MG PO TABS: 20.0000 mg | ORAL_TABLET | Freq: Every day | ORAL | 0 refills | Status: DC

## 2020-03-09 MED ORDER — TIZANIDINE HCL 2 MG PO TABS: 2.0000 mg | ORAL_TABLET | Freq: Every day | ORAL | 0 refills | Status: DC

## 2020-03-09 MED FILL — predniSONE 20 MG TABS: 20 | 5 days supply | Qty: 5 | Fill #0

## 2020-03-09 MED FILL — tiZANidine HCL 2 MG TABS: 2 | 30 days supply | Qty: 30 | Fill #0

## 2020-03-09 NOTE — Assessment & Plan Note (Addendum)
   Decision today to treat with OMT was based on Physical Exam  After verbal consent patient was treated with , ME, FPR techniques in  thoracic, rib, lumbar and sacral areas,patient was tender and had voluntary guarding and unable to tolerate HVLA  Patient tolerated the procedure well with improvement in symptoms  Patient given exercises, stretches and lifestyle modifications  See medications in patient instructions if given  Patient will follow up in 2-3 weeks

## 2020-03-09 NOTE — Assessment & Plan Note (Signed)
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  Patient given home exercises, discussed proper shoes, discussed avoiding certain activities.  Discussed tennis ball.  Discussed potential yoga we will for the upper back pain.  Follow-up with me again as scheduled.

## 2020-03-09 NOTE — Patient Instructions (Signed)
Good to see you Scapular and piriformis exercises  Prednisone 20 mg daily for 5 days No antiinflammatories while on prednisone Zanaflex 2 mg at night Ice 20 mins 2 times a day Keep hands within peripheral vision See me again as scheduled if not better

## 2020-03-09 NOTE — Assessment & Plan Note (Signed)
Finally the patient did have a muscle injury.  Seems to be more right greater than left.  No midline tenderness at this time.  Patient is able to take a deep breaths and is able to run up and down stairs without any significant difficulty.  History of certain movements especially rotation to the left seems to be still giving her trouble.  Attempted osteopathic manipulation with very minimal improvement of the moment.  Started on prednisone for 5 days and will hold other anti-inflammatories.  Patient also given a very low dose of Zanaflex that I think can be beneficial and helpful.  Discussed icing regimen.  Discussed ergonomics with work.  Follow-up again 2 to 3 weeks with patient already having a scheduled appointment.
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
## 2020-03-24 ENCOUNTER — Other Ambulatory Visit: Payer: Self-pay | Admitting: Family Medicine

## 2020-03-24 ENCOUNTER — Ambulatory Visit: Payer: 59 | Admitting: Family Medicine

## 2020-03-24 ENCOUNTER — Ambulatory Visit (INDEPENDENT_AMBULATORY_CARE_PROVIDER_SITE_OTHER): Payer: 59

## 2020-03-24 ENCOUNTER — Encounter: Payer: Self-pay | Admitting: Family Medicine

## 2020-03-24 ENCOUNTER — Other Ambulatory Visit: Payer: Self-pay

## 2020-03-24 ENCOUNTER — Ambulatory Visit: Payer: Self-pay

## 2020-03-24 VITALS — BP 122/88 | HR 101 | Ht 68.0 in | Wt 218.0 lb

## 2020-03-24 DIAGNOSIS — G8929 Other chronic pain: Secondary | ICD-10-CM | POA: Diagnosis not present

## 2020-03-24 DIAGNOSIS — M5030 Other cervical disc degeneration, unspecified cervical region: Secondary | ICD-10-CM | POA: Diagnosis not present

## 2020-03-24 DIAGNOSIS — M25511 Pain in right shoulder: Secondary | ICD-10-CM | POA: Diagnosis not present

## 2020-03-24 DIAGNOSIS — M542 Cervicalgia: Secondary | ICD-10-CM

## 2020-03-24 DIAGNOSIS — M19011 Primary osteoarthritis, right shoulder: Secondary | ICD-10-CM | POA: Diagnosis not present

## 2020-03-24 MED ORDER — GABAPENTIN 100 MG PO CAPS: 200.0000 mg | ORAL_CAPSULE | Freq: Every day | ORAL | 3 refills | Status: DC

## 2020-03-24 NOTE — Assessment & Plan Note (Signed)
Patient had more pain in the right shoulder.  Patient though does not have any true mass but may be a potential lipoma.  We discussed on ultrasound not good to see anything to do discuss other type of imaging.  We will get x-rays of this as well as the neck.  Patient will start on a low-dose of gabapentin.  Can continue to take the muscle relaxer as needed.  Discussed continuing home exercises.  Follow-up with me again in 3 to 4 weeks.  If continuing to have joint difficulty will consider the possibility of a CT chest but I think will be highly unlikely necessary.  Patient has a phobia of MRI.

## 2020-03-24 NOTE — Patient Instructions (Addendum)
Xray today Gabapentin 200mg  at night. Try this first and then can add the zanaflex if you need it as well  Ice 20 minutes 2 times daily. Usually after activity and before bed. Keep doing the exercises See me again in 3-4 weeks to make sure we are doing better
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
## 2020-04-14 ENCOUNTER — Encounter: Payer: Self-pay | Admitting: Family Medicine

## 2020-04-14 ENCOUNTER — Ambulatory Visit: Payer: Self-pay

## 2020-04-14 ENCOUNTER — Ambulatory Visit: Payer: 59 | Admitting: Family Medicine

## 2020-04-14 ENCOUNTER — Other Ambulatory Visit: Payer: Self-pay

## 2020-04-14 VITALS — BP 124/84 | HR 78 | Ht 68.0 in | Wt 217.0 lb

## 2020-04-14 DIAGNOSIS — M546 Pain in thoracic spine: Secondary | ICD-10-CM

## 2020-04-14 DIAGNOSIS — G5702 Lesion of sciatic nerve, left lower limb: Secondary | ICD-10-CM

## 2020-04-14 DIAGNOSIS — G8929 Other chronic pain: Secondary | ICD-10-CM | POA: Diagnosis not present

## 2020-04-14 DIAGNOSIS — M25512 Pain in left shoulder: Secondary | ICD-10-CM

## 2020-04-14 NOTE — Assessment & Plan Note (Signed)
Continue mild piriformis pain.  Given exercises today.  Discussed icing regimen and home exercise.  Discussed avoiding certain activities.  Patient will continue to be active.  Follow-up with me again in 2 to 3 months

## 2020-04-14 NOTE — Patient Instructions (Signed)
Good to see you Tumeric 500 mg 2 times a day Exercise 3 times a week See me again in 2 months

## 2020-04-14 NOTE — Assessment & Plan Note (Signed)
Significant improvement at this time.  Full range of motion of the shoulder.  Nothing concerning at this time.  Follow-up with me again as needed for this problem

## 2020-04-15 ENCOUNTER — Other Ambulatory Visit (HOSPITAL_COMMUNITY): Payer: Self-pay

## 2020-04-17 MED FILL — Gabapentin Cap 100 MG: ORAL | 30 days supply | Qty: 60 | Fill #0 | Status: AC

## 2020-04-17 MED FILL — Tolterodine Tartrate Cap ER 24HR 4 MG: ORAL | 30 days supply | Qty: 30 | Fill #0 | Status: AC

## 2020-04-18 ENCOUNTER — Other Ambulatory Visit (HOSPITAL_COMMUNITY): Payer: Self-pay

## 2020-04-25 ENCOUNTER — Telehealth: Payer: Self-pay | Admitting: Internal Medicine

## 2020-04-25 NOTE — Telephone Encounter (Signed)
Demonstrated fluid-filled small bowel with suggestion of mild wall thickening and mild mesenteric edema likely representing enteritis no evidence of bowel obstruction.  Not tender at all.  Trial of FD guard.  Observe if fails to resolve consider additional work-up if worsens consider additional work-up.

## 2020-05-14 ENCOUNTER — Other Ambulatory Visit (HOSPITAL_COMMUNITY): Payer: Self-pay

## 2020-05-14 MED FILL — Tolterodine Tartrate Cap ER 24HR 4 MG: ORAL | 30 days supply | Qty: 30 | Fill #1 | Status: CN

## 2020-05-14 MED FILL — Pantoprazole Sodium EC Tab 40 MG (Base Equiv): ORAL | 90 days supply | Qty: 90 | Fill #0 | Status: AC

## 2020-05-14 MED FILL — Tolterodine Tartrate Cap ER 24HR 4 MG: ORAL | 30 days supply | Qty: 30 | Fill #1 | Status: AC

## 2020-05-14 MED FILL — Atenolol Tab 50 MG: ORAL | 90 days supply | Qty: 90 | Fill #0 | Status: AC

## 2020-05-14 MED FILL — Pravastatin Sodium Tab 20 MG: ORAL | 90 days supply | Qty: 90 | Fill #0 | Status: AC

## 2020-05-14 MED FILL — Gabapentin Cap 100 MG: ORAL | 30 days supply | Qty: 60 | Fill #1 | Status: AC

## 2020-05-17 ENCOUNTER — Telehealth: Payer: Self-pay | Admitting: Internal Medicine

## 2020-05-17 DIAGNOSIS — R1011 Right upper quadrant pain: Secondary | ICD-10-CM

## 2020-05-17 NOTE — Telephone Encounter (Signed)
I have spoken to [place] and she is aware that the central scheduling  staff will be reaching out to her to set this up.

## 2020-05-17 NOTE — Telephone Encounter (Signed)
[place] still has a sense that there is something "in there" in the right upper quadrant.  Not a bad pain but a pressure it does not keep her awake.  Sometimes it is more prevalent when she bends over.  Perhaps twisting but not so much.  Does not seem to be related to eating.  Or defecation.  We will get an ultrasound.  Plan Limited right upper quadrant ultrasound diagnosis right upper quadrant pain

## 2020-05-26 ENCOUNTER — Ambulatory Visit (HOSPITAL_COMMUNITY)
Admission: RE | Admit: 2020-05-26 | Discharge: 2020-05-26 | Disposition: A | Discharge status: Home / Self Care | Payer: 59 | Source: Ambulatory Visit | Attending: Internal Medicine | Admitting: Internal Medicine

## 2020-05-26 ENCOUNTER — Other Ambulatory Visit: Payer: Self-pay

## 2020-05-26 DIAGNOSIS — R1011 Right upper quadrant pain: Secondary | ICD-10-CM | POA: Diagnosis not present

## 2020-05-26 DIAGNOSIS — K760 Fatty (change of) liver, not elsewhere classified: Secondary | ICD-10-CM | POA: Diagnosis not present

## 2020-05-26 DIAGNOSIS — R109 Unspecified abdominal pain: Secondary | ICD-10-CM | POA: Diagnosis not present

## 2020-06-15 MED FILL — Gabapentin Cap 100 MG: ORAL | 30 days supply | Qty: 60 | Fill #2 | Status: AC

## 2020-06-15 MED FILL — Tolterodine Tartrate Cap ER 24HR 4 MG: ORAL | 30 days supply | Qty: 30 | Fill #2 | Status: AC
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
## 2020-06-16 ENCOUNTER — Ambulatory Visit: Payer: 59 | Admitting: Family Medicine

## 2020-06-16 ENCOUNTER — Ambulatory Visit: Payer: Self-pay

## 2020-06-16 ENCOUNTER — Other Ambulatory Visit (HOSPITAL_COMMUNITY): Payer: Self-pay

## 2020-06-16 ENCOUNTER — Encounter: Payer: Self-pay | Admitting: Family Medicine

## 2020-06-16 ENCOUNTER — Other Ambulatory Visit: Payer: Self-pay

## 2020-06-16 VITALS — BP 120/88 | HR 92 | Ht 68.0 in | Wt 213.0 lb

## 2020-06-16 DIAGNOSIS — G8929 Other chronic pain: Secondary | ICD-10-CM | POA: Diagnosis not present

## 2020-06-16 DIAGNOSIS — M2550 Pain in unspecified joint: Secondary | ICD-10-CM

## 2020-06-16 DIAGNOSIS — M546 Pain in thoracic spine: Secondary | ICD-10-CM | POA: Diagnosis not present

## 2020-06-16 DIAGNOSIS — M25512 Pain in left shoulder: Secondary | ICD-10-CM

## 2020-06-16 LAB — CBC WITH DIFFERENTIAL/PLATELET
Basophils Absolute: 0 10*3/uL (ref 0.0–0.1)
Basophils Relative: 0.6 % (ref 0.0–3.0)
Eosinophils Absolute: 0.1 10*3/uL (ref 0.0–0.7)
Eosinophils Relative: 1.8 % (ref 0.0–5.0)
HCT: 40.8 % (ref 36.0–46.0)
Hemoglobin: 13.3 g/dL (ref 12.0–15.0)
Lymphocytes Relative: 38.4 % (ref 12.0–46.0)
Lymphs Abs: 2.1 10*3/uL (ref 0.7–4.0)
MCHC: 32.5 g/dL (ref 30.0–36.0)
MCV: 77.9 fl — ABNORMAL LOW (ref 78.0–100.0)
Monocytes Absolute: 0.6 10*3/uL (ref 0.1–1.0)
Monocytes Relative: 11.3 % (ref 3.0–12.0)
Neutro Abs: 2.7 10*3/uL (ref 1.4–7.7)
Neutrophils Relative %: 47.9 % (ref 43.0–77.0)
Platelets: 332 10*3/uL (ref 150.0–400.0)
RBC: 5.24 Mil/uL — ABNORMAL HIGH (ref 3.87–5.11)
RDW: 13.9 % (ref 11.5–15.5)
WBC: 5.5 10*3/uL (ref 4.0–10.5)

## 2020-06-16 LAB — URIC ACID: Uric Acid, Serum: 3.5 mg/dL (ref 2.4–7.0)

## 2020-06-16 LAB — COMPREHENSIVE METABOLIC PANEL
ALT: 24 U/L (ref 0–35)
AST: 19 U/L (ref 0–37)
Albumin: 4.6 g/dL (ref 3.5–5.2)
Alkaline Phosphatase: 70 U/L (ref 39–117)
BUN: 17 mg/dL (ref 6–23)
CO2: 27 mEq/L (ref 19–32)
Calcium: 9.6 mg/dL (ref 8.4–10.5)
Chloride: 103 mEq/L (ref 96–112)
Creatinine, Ser: 0.91 mg/dL (ref 0.40–1.20)
GFR: 68.11 mL/min (ref >60.00)
Glucose, Bld: 97 mg/dL (ref 70–99)
Potassium: 3.8 mEq/L (ref 3.5–5.1)
Sodium: 140 mEq/L (ref 135–145)
Total Bilirubin: 0.6 mg/dL (ref 0.2–1.2)
Total Protein: 7.2 g/dL (ref 6.0–8.3)

## 2020-06-16 LAB — C-REACTIVE PROTEIN: CRP: <1 mg/dL (ref 0.5–20.0)

## 2020-06-16 LAB — VITAMIN B12: Vitamin B-12: 728 pg/mL (ref 211–911)

## 2020-06-16 LAB — TSH: TSH: 1.69 u[IU]/mL (ref 0.35–4.50)

## 2020-06-16 LAB — VITAMIN D 25 HYDROXY (VIT D DEFICIENCY, FRACTURES): VITD: 57.02 ng/mL (ref 30.00–100.00)

## 2020-06-16 LAB — IBC PANEL
Iron: 81 ug/dL (ref 42–145)
Saturation Ratios: 25.9 % (ref 20.0–50.0)
Transferrin: 223 mg/dL (ref 212.0–360.0)

## 2020-06-16 LAB — SEDIMENTATION RATE: Sed Rate: 11 mm/hr (ref 0–30)

## 2020-06-16 NOTE — Patient Instructions (Signed)
Labs today Continue gabapentin Stay active See me again in 2-3 months

## 2020-06-16 NOTE — Assessment & Plan Note (Signed)
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  Patient has it in the cervical, thoracic and lumbar spine.  Is doing relatively well though with conservative therapy and taking the gabapentin at night.  We discussed with patient icing regimen and home exercises.  Patient is still concerned of some of the lipoma and we discussed potential further evaluation with either imaging or sinus surgery.  Patient declined and will continue to monitor.  We did discuss potential laboratory work-up secondary to the amount of arthritis in a young 60 year old.  We will get autoimmune work-up as well as inflammatory markers and uric acid.  If all normal continue with conservative therapy and follow-up again in 2 to 3 months

## 2020-06-18 LAB — ANTI-NUCLEAR AB-TITER (ANA TITER): ANA Titer 1: 1:1280 {titer} — ABNORMAL HIGH

## 2020-06-18 LAB — CYCLIC CITRUL PEPTIDE ANTIBODY, IGG: Cyclic Citrullin Peptide Ab: <16 UNITS

## 2020-06-18 LAB — PTH, INTACT AND CALCIUM
Calcium: 9.9 mg/dL (ref 8.6–10.4)
PTH: 28 pg/mL (ref 16–77)

## 2020-06-18 LAB — CALCIUM, IONIZED: Calcium, Ion: 5.02 mg/dL (ref 4.8–5.6)

## 2020-06-18 LAB — ANGIOTENSIN CONVERTING ENZYME: Angiotensin-Converting Enzyme: 28 U/L (ref 9–67)

## 2020-06-18 LAB — ANA: Anti Nuclear Antibody (ANA): POSITIVE — AB

## 2020-06-18 LAB — RHEUMATOID FACTOR: Rheumatoid fact SerPl-aCnc: <14 IU/mL (ref <14)

## 2020-06-21 ENCOUNTER — Other Ambulatory Visit: Payer: Self-pay

## 2020-06-21 ENCOUNTER — Encounter: Payer: Self-pay | Admitting: Family Medicine

## 2020-06-21 DIAGNOSIS — R768 Other specified abnormal immunological findings in serum: Secondary | ICD-10-CM

## 2020-06-30 DIAGNOSIS — H5203 Hypermetropia, bilateral: Secondary | ICD-10-CM | POA: Diagnosis not present

## 2020-06-30 DIAGNOSIS — H52203 Unspecified astigmatism, bilateral: Secondary | ICD-10-CM | POA: Diagnosis not present

## 2020-07-15 ENCOUNTER — Other Ambulatory Visit (HOSPITAL_COMMUNITY): Payer: Self-pay

## 2020-07-15 ENCOUNTER — Other Ambulatory Visit: Payer: Self-pay | Admitting: Internal Medicine

## 2020-07-15 MED ORDER — FLUTICASONE PROPIONATE 50 MCG/ACT NA SUSP
2.0000 | Freq: Every day | NASAL | 6 refills | Status: DC
  Filled 2020-07-15: qty 16, 30d supply, fill #0
  Filled 2020-08-08: qty 16, 30d supply, fill #1
  Filled 2021-01-02: qty 16, 30d supply, fill #2
  Filled 2021-02-18: qty 16, 30d supply, fill #3

## 2020-07-15 MED FILL — Tolterodine Tartrate Cap ER 24HR 4 MG: ORAL | 30 days supply | Qty: 30 | Fill #3 | Status: AC

## 2020-07-20 ENCOUNTER — Encounter: Payer: Self-pay | Admitting: Internal Medicine

## 2020-07-20 ENCOUNTER — Ambulatory Visit: Payer: 59 | Admitting: Internal Medicine

## 2020-07-20 ENCOUNTER — Other Ambulatory Visit: Payer: Self-pay

## 2020-07-20 VITALS — BP 123/84 | HR 92 | Ht 68.5 in | Wt 212.0 lb

## 2020-07-20 DIAGNOSIS — R768 Other specified abnormal immunological findings in serum: Secondary | ICD-10-CM

## 2020-07-20 DIAGNOSIS — R2242 Localized swelling, mass and lump, left lower limb: Secondary | ICD-10-CM

## 2020-07-20 DIAGNOSIS — M546 Pain in thoracic spine: Secondary | ICD-10-CM
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
## 2020-07-20 NOTE — Patient Instructions (Addendum)
Anti-DNA Antibody Test Why am I having this test? The anti-DNA antibody test helps with the diagnosis and follow-up of systemic lupus erythematosus (SLE). It is also used to monitor treatment of thiscondition as the antibody decreases with successful therapy. What is being tested? This test measures the amount of anti-DNA antibody in the blood. This antibody is found in 65-80% of patients with active SLE. This antibody is not as commonin patients who have other diseases. What kind of sample is taken?  A blood sample is required for this test. It is usually collected by insertinga needle into a blood vessel. How are the results reported? Your test results will be reported as a value. Your test results may also be reported as positive, intermediate, or negative. Your health care provider will compare your results to normal ranges that were established after testing a large group of people (reference values). Reference values may vary among labs and hospitals. For this test, common reference values are: Positive: 10 or more international units/mL. Intermediate: 5-9 international units/mL. Negative: Less than 5 international units/mL. What do the results mean? Positive results, which are associated with results that are higher than the reference values, may indicate: Autoimmune disorders such as SLE. Infectious mononucleosis. Chronic liver conditions. Intermediate results mean that the anti-DNA antibody levels are higher thannormal, but not high enough to be considered positive. Negative results mean that you do not have the anti-DNA antibody that isassociated with these conditions. Talk with your health care provider about what your results mean. Questions to ask your health care provider Ask your health care provider, or the department that is doing the test: When will my results be ready? How will I get my results? What are my treatment options? What other tests do I need? What are my next  steps? Summary The anti-DNA antibody test helps with the diagnosis and follow-up of systemic lupus erythematosus (SLE). It is also used to monitor treatment of this condition as the antibody decreases with successful therapy. This test measures the amount of anti-DNA antibody in the blood. Elevated levels of anti-DNA antibody can be seen in patients with SLE and certain other conditions.  Erythrocyte Sedimentation Rate Test Why am I having this test? The erythrocyte sedimentation rate (ESR) test is used to help find illnesses related to: Sudden (acute) or long-term (chronic) infections. Inflammation. The body's disease-fighting system attacking healthy cells (autoimmune diseases). Cancer. Tissue death. If you have symptoms that may be related to any of these illnesses, your health care provider may do an ESR test before doing more specific tests. If you have an inflammatory immune disease, such as rheumatoid arthritis, you may have thistest to help monitor your therapy. What is being tested? This test measures how long it takes for your red blood cells (erythrocytes) to settle in a solution over a certain amount of time (sedimentation rate). When you have an infection or inflammation, your red blood cells clump together and settle faster. The sedimentation rate provides information abouthow much inflammation is present in the body. What kind of sample is taken?  A blood sample is required for this test. It is usually collected by insertinga needle into a blood vessel. How do I prepare for this test? Follow any instructions from your health care provider about changing orstopping your regular medicines. Tell a health care provider about: Any allergies you have. All medicines you are taking, including vitamins, herbs, eye drops, creams, and over-the-counter medicines. Any blood disorders you have. Any surgeries you have had.  Any medical conditions you have, such as thyroid or kidney  disease. Whether you are pregnant or may be pregnant. How are the results reported? Your results will be reported as a value that measures sedimentation rate in millimeters per hour (mm/hr). Your health care provider will compare your results to normal ranges that were established after testing a large group of people (reference values). Reference values may vary among labs and hospitals. For this test, common reference values, which vary by age and gender, are: Newborn: 0-2 mm/hr. Child, up to puberty: 0-10 mm/hr. Female: Under 50 years: 0-20 mm/hr. 50-85 years: 0-30 mm/hr. Over 85 years: 0-42 mm/hr. Female: Under 50 years: 0-15 mm/hr. 50-85 years: 0-20 mm/hr. Over 85 years: 0-30 mm/hr. Certain conditions or medicines may cause ESR levels to be falsely lower or higher, such as: Pregnancy. Obesity. Steroids, birth control pills, and blood thinners. Thyroid or kidney disease. What do the results mean? Results that are within reference values are considered normal, meaning that the level of inflammation in your body is healthy. High ESR levels mean that there is inflammation in your body. You will have more tests to help make adiagnosis. Inflammation may result from many different conditions or injuries. Talk with your health care provider about what your results mean. Questions to ask your health care provider Ask your health care provider, or the department that is doing the test: When will my results be ready? How will I get my results? What are my treatment options? What other tests do I need? What are my next steps? Summary The erythrocyte sedimentation rate (ESR) test is used to help find illnesses associated with sudden (acute) or long-term (chronic) infections, inflammation, autoimmune diseases, cancer, or tissue death. If you have symptoms that may be related to any of these illnesses, your health care provider may do an ESR test before doing more specific tests. If you have an  inflammatory immune disease, such as rheumatoid arthritis, you may have this test to help monitor your therapy. This test measures how long it takes for your red blood cells (erythrocytes) to settle in a solution over a certain amount of time (sedimentation rate). This provides information about how much inflammation is present in the body.  Urine Protein Test Why am I having this test? A urine protein test may be used to check the health of your kidneys and to look for kidney damage or kidney disease. The test may be done as part of a normal screening or if you have a condition that affects your kidneys, such as: Diabetes. High blood pressure (hypertension). Urinary tract infection. Problems resulting from pregnancy, including preeclampsia. Your health care provider may also do this test if you have symptoms of kidney disease. These can include: Fluid retention, also called edema. Tiredness, or fatigue. Nausea. What is being tested? This test measures the amount of protein in your urine. Your blood contains proteins. When your blood is filtered by your kidneys, most of the proteins should be returned to your blood and there should be no protein or very little protein in your urine. If you have a large amount of protein in your urine (proteinuria), your kidneys may not be working as well as they should. What kind of sample is taken? This test requires a urine sample. Two methods may be used for the test: A dipstick test is often the first step in a urine protein test. After you urinate into a germ-free (sterile) cup, your health care provider will dip a  test strip into your sample. The health care provider can tell right away if there is protein in your urine. Your health care provider may do a 24-hour urine test if the dipstick test shows that there is a large amount of protein in your urine. This test measures the amount of protein that is released in your urine over a 24-hour period. You may  also have other tests to find out the types of proteins you have inyour urine. How do I collect samples at home?  If your health care provider wants to do a 24-hour urine test, you may be asked to collect urine samples at home over a 24-hour period. Follow instructionsfrom a health care provider about how to collect the samples. When collecting a urine sample at home, make sure you: Use supplies and instructions that you received from the lab. Collect urine only in the sterile cup that you received from the lab. Do not let any toilet paper or stool (feces) get into the cup. Refrigerate the sample or keep it on ice until you can return it to the lab. Return the sample(s) to the lab as instructed. Tell a health care provider about: All medicines you are taking, including vitamins, herbs, eye drops, creams, and over-the-counter medicines. Any medical conditions you have. Some conditions can cause you to have protein in your urine. Whether you have had a radiology scan with contrast dye. Whether you have exercised heavily in the last few days. Whether you are pregnant or may be pregnant. How are the results reported? The result of the urine dipstick test for proteins will be reported as either positive or negative. A negative result means no protein or a small amount of protein was found in your urine. A positive result means a large amount of protein was found in your urine. The result of the 24-hour urine test will be reported as a value that indicates the amount of protein in your urine. Your health care provider will compare your results to normal ranges that were established after testing a large group of people (reference ranges). Reference ranges may vary among labs and hospitals. For someone at rest, 50-80 mg per 24 hours is considered normal. For someone exercising, 50-250 mgper 24 hours is considered normal. What do the results mean? Many conditions can cause a level of protein in your  urine that is higher than normal, such as: Dehydration. Doing exercise that takes a lot of effort (is vigorous). Urinary tract infection. High blood pressure, heart failure, or diabetes. Kidney disease. Bladder cancer. Preeclampsia, in pregnant women. Talk with your health care provider about what your results mean. Questions to ask your health care provider Ask your health care provider, or the department that is doing the test: When will my results be ready? How will I get my results? What are my treatment options? What other tests do I need? What are my next steps? Summary The urine protein test is often used to screen for kidney disease. This test measures the amount of protein in your urine. If you have a large amount of protein in your urine (proteinuria), your kidneys may not be working as well as they should. Your health care provider may do a 24-hour urine test if the dipstick test shows that there is a large amount of protein in your urine. Talk with your health care provider about what your results mean. This information is not intended to replace advice given to you by your health care provider.  Make sure you discuss any questions you have with your healthcare provider. Document Revised: 08/28/2019 Document Reviewed: 03/20/2019 Elsevier Patient Education  [place].

## 2020-07-21 LAB — CENTROMERE ANTIBODIES: Centromere Ab Screen: >8 AI — AB

## 2020-07-21 LAB — PROTEIN / CREATININE RATIO, URINE
Creatinine, Urine: 144 mg/dL (ref 20–275)
Protein/Creat Ratio: 97 mg/g creat (ref 21–161)
Protein/Creatinine Ratio: 0.097 mg/mg creat (ref 0.021–0.161)
Total Protein, Urine: 14 mg/dL (ref 5–24)

## 2020-07-21 LAB — SEDIMENTATION RATE: Sed Rate: 9 mm/h (ref 0–30)

## 2020-07-21 LAB — ANTI-SMITH ANTIBODY: ENA SM Ab Ser-aCnc: <1 AI

## 2020-07-21 LAB — ANTI-SCLERODERMA ANTIBODY: Scleroderma (Scl-70) (ENA) Antibody, IgG: <1 AI

## 2020-07-21 LAB — RNP ANTIBODY: Ribonucleic Protein(ENA) Antibody, IgG: <1 AI

## 2020-07-21 LAB — ANTI-DNA ANTIBODY, DOUBLE-STRANDED: ds DNA Ab: 1 IU/mL

## 2020-07-25 NOTE — Progress Notes (Signed)
Labs do not show active inflammation, antibody tests highly positive for anti-centromere antibodies. She does not have current signs or symptoms of associated disease but I recommend we schedule a follow up in about 6 months.

## 2020-07-27 ENCOUNTER — Encounter: Payer: Self-pay | Admitting: Internal Medicine

## 2020-07-28 ENCOUNTER — Other Ambulatory Visit (HOSPITAL_COMMUNITY): Payer: Self-pay

## 2020-07-28 ENCOUNTER — Ambulatory Visit (HOSPITAL_COMMUNITY)
Admission: EM | Admit: 2020-07-28 | Discharge: 2020-07-28 | Disposition: A | Discharge status: Home / Self Care | Payer: 59 | Attending: Emergency Medicine | Admitting: Emergency Medicine

## 2020-07-28 ENCOUNTER — Encounter (HOSPITAL_COMMUNITY): Payer: Self-pay | Admitting: Emergency Medicine

## 2020-07-28 ENCOUNTER — Other Ambulatory Visit: Payer: Self-pay

## 2020-07-28 DIAGNOSIS — U071 COVID-19: Secondary | ICD-10-CM

## 2020-07-28 MED ORDER — FEXOFENADINE HCL 60 MG PO TABS
60.0000 mg | ORAL_TABLET | Freq: Two times a day (BID) | ORAL | 1 refills | Status: DC
  Filled 2020-07-28: qty 60, 30d supply, fill #0

## 2020-07-28 MED ORDER — AZELASTINE HCL 0.1 % NA SOLN
1.0000 | Freq: Two times a day (BID) | NASAL | 12 refills | Status: DC
  Filled 2020-07-28: qty 30, 50d supply, fill #0

## 2020-07-28 MED ORDER — PSEUDOEPHEDRINE HCL ER 120 MG PO TB12
120.0000 mg | ORAL_TABLET | Freq: Two times a day (BID) | ORAL | 0 refills | Status: DC
  Filled 2020-07-28: qty 12, 6d supply, fill #0

## 2020-07-28 NOTE — ED Triage Notes (Signed)
Stuffy nose on Friday, tested positive for covid on Saturday.  Patient feels drainage in throat, slight yellow to what she is blowing.  Facial stuffiness.    Musinex Coricidin Flonase  These meds are not helping Pcp cannot see patient until Tuesday and sent to [facility]

## 2020-07-28 NOTE — Discharge Instructions (Addendum)
Can use allegra twice daily   Can use azelastine 1 spray bid   Can continue of flonase as prescribed  May try sudafed twice a day  Your symptoms are caused by the virus therefore an antibiotic would not be helpful at this time
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
## 2020-08-02 ENCOUNTER — Ambulatory Visit: Payer: 59 | Admitting: Internal Medicine

## 2020-08-08 ENCOUNTER — Other Ambulatory Visit (HOSPITAL_COMMUNITY): Payer: Self-pay

## 2020-08-08 ENCOUNTER — Other Ambulatory Visit: Payer: Self-pay

## 2020-08-08 MED FILL — Pantoprazole Sodium EC Tab 40 MG (Base Equiv): ORAL | 90 days supply | Qty: 90 | Fill #1 | Status: AC

## 2020-08-08 MED FILL — Pravastatin Sodium Tab 20 MG: ORAL | 90 days supply | Qty: 90 | Fill #1 | Status: AC

## 2020-08-08 MED FILL — Atenolol Tab 50 MG: ORAL | 90 days supply | Qty: 90 | Fill #1 | Status: AC

## 2020-08-09 ENCOUNTER — Other Ambulatory Visit (HOSPITAL_COMMUNITY): Payer: Self-pay

## 2020-08-09 MED ORDER — TOLTERODINE TARTRATE ER 4 MG PO CP24
4.0000 mg | ORAL_CAPSULE | Freq: Every day | ORAL | 2 refills | Status: DC
  Filled 2020-08-09: qty 90, 90d supply, fill #0
  Filled 2020-11-02: qty 90, 90d supply, fill #1
  Filled 2021-02-01: qty 90, 90d supply, fill #2

## 2020-08-10 ENCOUNTER — Other Ambulatory Visit (HOSPITAL_COMMUNITY): Payer: Self-pay

## 2020-08-11 ENCOUNTER — Other Ambulatory Visit (HOSPITAL_COMMUNITY): Payer: Self-pay

## 2020-08-12 ENCOUNTER — Other Ambulatory Visit (HOSPITAL_COMMUNITY): Payer: Self-pay

## 2020-08-12 MED ORDER — CARESTART COVID-19 HOME TEST VI KIT
PACK | 0 refills | Status: DC
  Filled 2020-08-12: qty 4, 4d supply, fill #0
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
## 2020-08-25 ENCOUNTER — Other Ambulatory Visit: Payer: Self-pay

## 2020-08-25 ENCOUNTER — Ambulatory Visit: Payer: Self-pay

## 2020-08-25 ENCOUNTER — Ambulatory Visit: Payer: 59 | Admitting: Family Medicine

## 2020-08-25 VITALS — BP 110/80 | HR 77 | Ht 68.5 in | Wt 212.0 lb

## 2020-08-25 DIAGNOSIS — M546 Pain in thoracic spine: Secondary | ICD-10-CM | POA: Diagnosis not present

## 2020-08-25 DIAGNOSIS — R768 Other specified abnormal immunological findings in serum: Secondary | ICD-10-CM

## 2020-08-25 DIAGNOSIS — M25511 Pain in right shoulder: Secondary | ICD-10-CM | POA: Diagnosis not present

## 2020-08-25 NOTE — Assessment & Plan Note (Signed)
Patient does have some arthritic changes but no fracture.  We will continue to monitor but is doing much better.  Follow-up as needed

## 2020-08-25 NOTE — Assessment & Plan Note (Signed)
Patient is following up with endocrinology again.  Seems to be doing well with just Tylenol.  No longer taking the gabapentin.  Patient can follow-up with me as needed

## 2020-11-02 ENCOUNTER — Other Ambulatory Visit (HOSPITAL_COMMUNITY): Payer: Self-pay

## 2020-11-02 MED FILL — Pantoprazole Sodium EC Tab 40 MG (Base Equiv): ORAL | 90 days supply | Qty: 90 | Fill #2 | Status: AC

## 2020-11-02 MED FILL — Pravastatin Sodium Tab 20 MG: ORAL | 90 days supply | Qty: 90 | Fill #2 | Status: AC

## 2020-11-02 MED FILL — Atenolol Tab 50 MG: ORAL | 90 days supply | Qty: 90 | Fill #2 | Status: AC

## 2020-12-05 ENCOUNTER — Other Ambulatory Visit: Payer: Self-pay

## 2020-12-05 ENCOUNTER — Ambulatory Visit (INDEPENDENT_AMBULATORY_CARE_PROVIDER_SITE_OTHER): Payer: 59 | Admitting: Internal Medicine

## 2020-12-05 ENCOUNTER — Encounter: Payer: Self-pay | Admitting: Internal Medicine

## 2020-12-05 VITALS — BP 122/80 | HR 88 | Resp 18 | Ht 68.5 in | Wt 210.4 lb

## 2020-12-05 DIAGNOSIS — I10 Essential (primary) hypertension: Secondary | ICD-10-CM | POA: Diagnosis not present

## 2020-12-05 DIAGNOSIS — E782 Mixed hyperlipidemia: Secondary | ICD-10-CM

## 2020-12-05 DIAGNOSIS — Z0000 Encounter for general adult medical examination without abnormal findings: Secondary | ICD-10-CM

## 2020-12-05 DIAGNOSIS — K219 Gastro-esophageal reflux disease without esophagitis: Secondary | ICD-10-CM | POA: Diagnosis not present

## 2020-12-05 NOTE — Assessment & Plan Note (Signed)
BP at goal on atenolol. Recent CMP normal. EKG done to update baseline and no changes. Continue atenolol.

## 2020-12-05 NOTE — Assessment & Plan Note (Signed)
Taking pravastatin 20 mg daily and is down 5 pounds with healthier diet from last year so we elected to wait until next year to check labs given a lot of recent labs. Continue pravastatin 20 mg daily.

## 2020-12-05 NOTE — Assessment & Plan Note (Signed)
Taking protonix and doing well overall.

## 2020-12-05 NOTE — Assessment & Plan Note (Signed)
Flu shot up to date. Covid-19 booster counseled. Pneumonia up to date. Shingrix complete. Tetanus up to date. Colonoscopy up to date. Mammogram up to date, pap smear up to date and dexa ordered. Counseled about sun safety and mole surveillance. Counseled about the dangers of distracted driving. Given 10 year screening recommendations.

## 2020-12-05 NOTE — Patient Instructions (Signed)
We have done the EKG which is not changed from before.

## 2020-12-05 NOTE — Progress Notes (Signed)
   Subjective:   Patient ID: [name], female    DOB: 11/09/1960, 60 y.o.   MRN: [id number]  HPI The patient is a 60 YO female coming in for physical.  PMH, [place], social history reviewed and updated  Review of Systems  Constitutional: Negative.   HENT: Negative.    Eyes: Negative.   Respiratory:  Negative for cough, chest tightness and shortness of breath.   Cardiovascular:  Negative for chest pain, palpitations and leg swelling.  Gastrointestinal:  Negative for abdominal distention, abdominal pain, constipation, diarrhea, nausea and vomiting.  Musculoskeletal: Negative.   Skin: Negative.   Neurological: Negative.   Psychiatric/Behavioral: Negative.     Objective:  Physical Exam Constitutional:      Appearance: She is well-developed.  HENT:     Head: Normocephalic and atraumatic.  Cardiovascular:     Rate and Rhythm: Normal rate and regular rhythm.  Pulmonary:     Effort: Pulmonary effort is normal. No respiratory distress.     Breath sounds: Normal breath sounds. No wheezing or rales.  Abdominal:     General: Bowel sounds are normal. There is no distension.     Palpations: Abdomen is soft.     Tenderness: There is no abdominal tenderness. There is no rebound.  Musculoskeletal:     Cervical back: Normal range of motion.  Skin:    General: Skin is warm and dry.  Neurological:     Mental Status: She is alert and oriented to person, place, and time.     Coordination: Coordination normal.    Vitals:   12/05/20 1259  BP: 122/80  Pulse: 88  Resp: 18  SpO2: 98%  Weight: 210 lb 6.4 oz (95.4 kg)  Height: 5' 8.5" (1.74 m)   EKG: Rate 73, axis normal, interval normal, sinus, no st or t wave changes, no significant change compared to prior 2017   This visit occurred during the SARS-CoV-2 public health emergency.  Safety protocols were in place, including screening questions prior to the visit, additional usage of staff PPE, and extensive cleaning of exam room while  observing appropriate contact time as indicated for disinfecting solutions.   Assessment & Plan:

## 2020-12-08 ENCOUNTER — Ambulatory Visit (INDEPENDENT_AMBULATORY_CARE_PROVIDER_SITE_OTHER)
Admission: RE | Admit: 2020-12-08 | Discharge: 2020-12-08 | Disposition: A | Discharge status: Home / Self Care | Payer: 59 | Source: Ambulatory Visit | Attending: Internal Medicine | Admitting: Internal Medicine

## 2020-12-08 ENCOUNTER — Other Ambulatory Visit: Payer: Self-pay

## 2020-12-08 DIAGNOSIS — Z0000 Encounter for general adult medical examination without abnormal findings: Secondary | ICD-10-CM

## 2021-01-03 ENCOUNTER — Other Ambulatory Visit (HOSPITAL_COMMUNITY): Payer: Self-pay
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
## 2021-01-26 ENCOUNTER — Encounter: Payer: Self-pay | Admitting: Internal Medicine

## 2021-01-26 ENCOUNTER — Other Ambulatory Visit: Payer: Self-pay

## 2021-01-26 ENCOUNTER — Ambulatory Visit (INDEPENDENT_AMBULATORY_CARE_PROVIDER_SITE_OTHER): Payer: 59 | Admitting: Internal Medicine

## 2021-01-26 VITALS — BP 138/86 | HR 88 | Ht 68.5 in | Wt 212.2 lb

## 2021-01-26 DIAGNOSIS — L814 Other melanin hyperpigmentation: Secondary | ICD-10-CM | POA: Diagnosis not present

## 2021-01-26 DIAGNOSIS — R768 Other specified abnormal immunological findings in serum: Secondary | ICD-10-CM

## 2021-01-31 ENCOUNTER — Encounter: Payer: Self-pay | Admitting: Internal Medicine

## 2021-02-01 ENCOUNTER — Other Ambulatory Visit: Payer: Self-pay | Admitting: Internal Medicine

## 2021-02-02 ENCOUNTER — Other Ambulatory Visit (HOSPITAL_COMMUNITY): Payer: Self-pay

## 2021-02-03 ENCOUNTER — Other Ambulatory Visit (HOSPITAL_COMMUNITY): Payer: Self-pay

## 2021-02-03 MED ORDER — PRAVASTATIN SODIUM 20 MG PO TABS
ORAL_TABLET | Freq: Every day | ORAL | 3 refills | Status: DC
  Filled 2021-02-03: qty 90, 90d supply, fill #0
  Filled 2021-05-09: qty 90, 90d supply, fill #1
  Filled 2021-07-31: qty 90, 90d supply, fill #2

## 2021-02-03 MED ORDER — PANTOPRAZOLE SODIUM 40 MG PO TBEC
DELAYED_RELEASE_TABLET | Freq: Every day | ORAL | 3 refills | Status: DC
  Filled 2021-02-03: qty 90, 90d supply, fill #0
  Filled 2021-05-09: qty 90, 90d supply, fill #1
  Filled 2021-07-31: qty 90, 90d supply, fill #2
  Filled 2021-11-07: qty 90, 90d supply, fill #3

## 2021-02-03 MED ORDER — ATENOLOL 50 MG PO TABS
ORAL_TABLET | Freq: Every day | ORAL | 3 refills | Status: DC
  Filled 2021-02-03: qty 90, 90d supply, fill #0
  Filled 2021-05-09: qty 90, 90d supply, fill #1

## 2021-02-14 DIAGNOSIS — M67911 Unspecified disorder of synovium and tendon, right shoulder: Secondary | ICD-10-CM | POA: Diagnosis not present

## 2021-02-14 DIAGNOSIS — M25511 Pain in right shoulder: Secondary | ICD-10-CM | POA: Diagnosis not present

## 2021-02-18 ENCOUNTER — Other Ambulatory Visit (HOSPITAL_COMMUNITY): Payer: Self-pay

## 2021-02-23 ENCOUNTER — Encounter: Payer: Self-pay | Admitting: Internal Medicine

## 2021-03-28 ENCOUNTER — Other Ambulatory Visit (HOSPITAL_COMMUNITY): Payer: Self-pay

## 2021-03-28 DIAGNOSIS — M2550 Pain in unspecified joint: Secondary | ICD-10-CM | POA: Diagnosis not present

## 2021-03-28 DIAGNOSIS — H9201 Otalgia, right ear: Secondary | ICD-10-CM | POA: Diagnosis not present

## 2021-03-28 DIAGNOSIS — H9193 Unspecified hearing loss, bilateral: Secondary | ICD-10-CM | POA: Diagnosis not present

## 2021-03-28 DIAGNOSIS — H9311 Tinnitus, right ear: Secondary | ICD-10-CM | POA: Diagnosis not present

## 2021-03-28 MED ORDER — FLUOCINOLONE ACETONIDE 0.01 % OT OIL
TOPICAL_OIL | OTIC | 0 refills | Status: DC
  Filled 2021-03-28: qty 20, 30d supply, fill #0

## 2021-04-10 ENCOUNTER — Encounter: Payer: Self-pay | Admitting: Internal Medicine

## 2021-04-10 ENCOUNTER — Ambulatory Visit: Payer: 59 | Admitting: Internal Medicine

## 2021-04-10 VITALS — BP 130/90 | HR 102 | Resp 18 | Ht 68.5 in | Wt 214.2 lb

## 2021-04-10 DIAGNOSIS — R202 Paresthesia of skin: Secondary | ICD-10-CM | POA: Diagnosis not present

## 2021-04-10 DIAGNOSIS — I10 Essential (primary) hypertension: Secondary | ICD-10-CM | POA: Diagnosis not present

## 2021-04-10 DIAGNOSIS — Z1231 Encounter for screening mammogram for malignant neoplasm of breast: Secondary | ICD-10-CM | POA: Diagnosis not present

## 2021-04-10 DIAGNOSIS — Z9189 Other specified personal risk factors, not elsewhere classified: Secondary | ICD-10-CM

## 2021-04-10 LAB — COMPREHENSIVE METABOLIC PANEL
ALT: 28 U/L (ref 0–35)
AST: 23 U/L (ref 0–37)
Albumin: 4.5 g/dL (ref 3.5–5.2)
Alkaline Phosphatase: 68 U/L (ref 39–117)
BUN: 18 mg/dL (ref 6–23)
CO2: 31 mEq/L (ref 19–32)
Calcium: 10 mg/dL (ref 8.4–10.5)
Chloride: 103 mEq/L (ref 96–112)
Creatinine, Ser: 0.98 mg/dL (ref 0.40–1.20)
GFR: 61.96 mL/min (ref >60.00)
Glucose, Bld: 125 mg/dL — ABNORMAL HIGH (ref 70–99)
Potassium: 4 mEq/L (ref 3.5–5.1)
Sodium: 141 mEq/L (ref 135–145)
Total Bilirubin: 0.6 mg/dL (ref 0.2–1.2)
Total Protein: 7 g/dL (ref 6.0–8.3)

## 2021-04-10 LAB — VITAMIN B12: Vitamin B-12: 952 pg/mL — ABNORMAL HIGH (ref 211–911)

## 2021-04-10 LAB — CBC
HCT: 40 % (ref 36.0–46.0)
Hemoglobin: 13 g/dL (ref 12.0–15.0)
MCHC: 32.4 g/dL (ref 30.0–36.0)
MCV: 77.6 fl — ABNORMAL LOW (ref 78.0–100.0)
Platelets: 319 10*3/uL (ref 150.0–400.0)
RBC: 5.16 Mil/uL — ABNORMAL HIGH (ref 3.87–5.11)
RDW: 14.3 % (ref 11.5–15.5)
WBC: 6.2 10*3/uL (ref 4.0–10.5)

## 2021-04-10 LAB — HEMOGLOBIN A1C: Hgb A1c MFr Bld: 6.1 % (ref 4.6–6.5)

## 2021-04-10 LAB — TSH: TSH: 2.01 u[IU]/mL (ref 0.35–5.50)

## 2021-04-10 LAB — VITAMIN D 25 HYDROXY (VIT D DEFICIENCY, FRACTURES): VITD: 63.63 ng/mL (ref 30.00–100.00)

## 2021-04-10 NOTE — Assessment & Plan Note (Signed)
Checking HgA1c, B12, vitamin D, TSH to assess for metabolic cause. Known DDD low back which could cause symptoms.  ?

## 2021-04-10 NOTE — Patient Instructions (Signed)
Work on exercise to help bring the blood pressure down. ? ? ?

## 2021-04-10 NOTE — Assessment & Plan Note (Signed)
Checking CBC, CMP. Ordered CT calcium score to assess risk for CV disease. BP is borderline high today and on average. Continue atenolol 50 mg daily. Counseled about dash and exercise to help lower BP. ?

## 2021-04-10 NOTE — Progress Notes (Signed)
? ?  Subjective:  ? ?Patient ID: [name], female    DOB: 16-Dec-1960, 61 y.o.   MRN: [id number] ? ?HPI ?The patient is a 61 YO female coming in for elevated BP no change in diet, exercise, sleep, stress, medications. ? ?Review of Systems  ?Constitutional: Negative.   ?HENT: Negative.    ?Eyes: Negative.   ?Respiratory:  Negative for cough, chest tightness and shortness of breath.   ?Cardiovascular:  Negative for chest pain, palpitations and leg swelling.  ?Gastrointestinal:  Negative for abdominal distention, abdominal pain, constipation, diarrhea, nausea and vomiting.  ?Musculoskeletal: Negative.   ?Skin: Negative.   ?Neurological: Negative.   ?Psychiatric/Behavioral: Negative.    ? ?Objective:  ?Physical Exam ?Constitutional:   ?   Appearance: She is well-developed.  ?HENT:  ?   Head: Normocephalic and atraumatic.  ?Cardiovascular:  ?   Rate and Rhythm: Normal rate and regular rhythm.  ?Pulmonary:  ?   Effort: Pulmonary effort is normal. No respiratory distress.  ?   Breath sounds: Normal breath sounds. No wheezing or rales.  ?Abdominal:  ?   General: Bowel sounds are normal. There is no distension.  ?   Palpations: Abdomen is soft.  ?   Tenderness: There is no abdominal tenderness. There is no rebound.  ?Musculoskeletal:  ?   Cervical back: Normal range of motion.  ?Skin: ?   General: Skin is warm and dry.  ?Neurological:  ?   Mental Status: She is alert and oriented to person, place, and time.  ?   Coordination: Coordination normal.  ? ? ?Vitals:  ? 04/10/21 1308  ?BP: 130/90  ?Pulse: (!) 102  ?Resp: 18  ?SpO2: 98%  ?Weight: 214 lb 3.2 oz (97.2 kg)  ?Height: 5' 8.5" (1.74 m)  ? ? ?This visit occurred during the SARS-CoV-2 public health emergency.  Safety protocols were in place, including screening questions prior to the visit, additional usage of staff PPE, and extensive cleaning of exam room while observing appropriate contact time as indicated for disinfecting solutions.  ? ?Assessment & Plan:  ? ?

## 2021-04-22 ENCOUNTER — Encounter: Payer: Self-pay | Admitting: Internal Medicine

## 2021-04-24 ENCOUNTER — Encounter: Payer: Self-pay | Admitting: Internal Medicine

## 2021-04-24 ENCOUNTER — Telehealth: Payer: Self-pay | Admitting: Internal Medicine

## 2021-04-24 ENCOUNTER — Other Ambulatory Visit (HOSPITAL_COMMUNITY): Payer: Self-pay

## 2021-04-24 MED ORDER — VALSARTAN 80 MG PO TABS
80.0000 mg | ORAL_TABLET | Freq: Every day | ORAL | 3 refills | Status: DC
  Filled 2021-04-24: qty 90, 90d supply, fill #0

## 2021-04-24 MED ORDER — ALPRAZOLAM 0.25 MG PO TABS
0.2500 mg | ORAL_TABLET | Freq: Every day | ORAL | 0 refills | Status: DC | PRN
  Filled 2021-04-24: qty 5, 5d supply, fill #0

## 2021-04-24 NOTE — Telephone Encounter (Signed)
Pt checking response status of 4-15 mychart mssg,  ? ?Pt states she spoke a w/ triage nurse on 4-15 and they recommended she sees the provider today due to elevated bp noted in [place] message ? ?Pt states she was informed a mssg would be sent to Dr. Sharlet Salina stating pt needs to be seen by today, pt states she is very concerned bc bp has never been this elevated ? ?I was unable to locate mssg, pt requesting a cb  ?

## 2021-04-24 NOTE — Telephone Encounter (Signed)
Check [place] registry no xanax on file.Marland KitchenJohny Chess ? ?

## 2021-04-25 ENCOUNTER — Ambulatory Visit: Payer: 59 | Admitting: Family Medicine

## 2021-04-25 ENCOUNTER — Encounter: Payer: Self-pay | Admitting: Internal Medicine

## 2021-04-27 NOTE — Telephone Encounter (Signed)
New medication has already been sent to the pt's pharmacy by pcp. ?

## 2021-04-27 NOTE — Telephone Encounter (Signed)
Connected to [facility] 4.15.2023. ? ?Caller states she has been having problems with blood pressure. She has been taking medication. Yesterday it was 150/96 and this morning it was 158/101. Last blood pressure currently is 160/108 HR 93. Denies chest pain, denies sob, denies any other symptoms. Had headache this morning, took tylenol, went away. Caller states "i feel fine right now." No ?Fever. ? ?Advised to see PCP within 3 days.  ?

## 2021-05-08 ENCOUNTER — Ambulatory Visit: Payer: 59 | Admitting: Internal Medicine

## 2021-05-08 ENCOUNTER — Encounter: Payer: Self-pay | Admitting: Internal Medicine

## 2021-05-08 VITALS — BP 128/72 | HR 77 | Temp 98.0°F | Ht 68.5 in | Wt 210.0 lb

## 2021-05-08 DIAGNOSIS — I10 Essential (primary) hypertension: Secondary | ICD-10-CM | POA: Diagnosis not present

## 2021-05-08 LAB — BASIC METABOLIC PANEL
BUN: 21 mg/dL (ref 6–23)
CO2: 29 mEq/L (ref 19–32)
Calcium: 9.4 mg/dL (ref 8.4–10.5)
Chloride: 105 mEq/L (ref 96–112)
Creatinine, Ser: 0.98 mg/dL (ref 0.40–1.20)
GFR: 61.93 mL/min (ref >60.00)
Glucose, Bld: 109 mg/dL — ABNORMAL HIGH (ref 70–99)
Potassium: 4.1 mEq/L (ref 3.5–5.1)
Sodium: 141 mEq/L (ref 135–145)

## 2021-05-08 NOTE — Patient Instructions (Signed)
We will check the labs today and wait on the results of the cardiac scan. ? ?The blood pressure is much better today. ?

## 2021-05-08 NOTE — Assessment & Plan Note (Signed)
BP is at goal today and improved on home readings. Continue valsartan 80 mg daily and atenolol 50 mg daily. Checking BMP and adjust as needed.  ?

## 2021-05-08 NOTE — Progress Notes (Signed)
? ?  Subjective:  ? ?Patient ID: [name], female    DOB: [birth year], 61 y.o.   MRN: [id number] ? ?HPI ?The patient is here for BP follow up started valsartan 2 weeks ago. ? ?Review of Systems  ?Constitutional: Negative.   ?HENT: Negative.    ?Eyes: Negative.   ?Respiratory:  Negative for cough, chest tightness and shortness of breath.   ?Cardiovascular:  Negative for chest pain, palpitations and leg swelling.  ?Gastrointestinal:  Negative for abdominal distention, abdominal pain, constipation, diarrhea, nausea and vomiting.  ?Musculoskeletal: Negative.   ?Skin: Negative.   ?Neurological: Negative.   ?Psychiatric/Behavioral: Negative.    ? ?Objective:  ?Physical Exam ?Constitutional:   ?   Appearance: She is well-developed.  ?HENT:  ?   Head: Normocephalic and atraumatic.  ?Cardiovascular:  ?   Rate and Rhythm: Normal rate and regular rhythm.  ?Pulmonary:  ?   Effort: Pulmonary effort is normal. No respiratory distress.  ?   Breath sounds: Normal breath sounds. No wheezing or rales.  ?Abdominal:  ?   General: Bowel sounds are normal. There is no distension.  ?   Palpations: Abdomen is soft.  ?   Tenderness: There is no abdominal tenderness. There is no rebound.  ?Musculoskeletal:  ?   Cervical back: Normal range of motion.  ?Skin: ?   General: Skin is warm and dry.  ?Neurological:  ?   Mental Status: She is alert and oriented to person, place, and time.  ?   Coordination: Coordination normal.  ? ? ?Vitals:  ? 05/08/21 0812  ?BP: 128/72  ?Pulse: 77  ?Temp: 98 ?F (36.7 ?C)  ?TempSrc: Oral  ?SpO2: 98%  ?Weight: 210 lb (95.3 kg)  ?Height: 5' 8.5" (1.74 m)  ? ? ?This visit occurred during the SARS-CoV-2 public health emergency.  Safety protocols were in place, including screening questions prior to the visit, additional usage of staff PPE, and extensive cleaning of exam room while observing appropriate contact time as indicated for disinfecting solutions.  ? ?Assessment & Plan:  ? ?

## 2021-05-09 ENCOUNTER — Other Ambulatory Visit (HOSPITAL_COMMUNITY): Payer: Self-pay

## 2021-05-10 ENCOUNTER — Other Ambulatory Visit (HOSPITAL_COMMUNITY): Payer: Self-pay

## 2021-05-10 MED ORDER — TOLTERODINE TARTRATE ER 4 MG PO CP24
4.0000 mg | ORAL_CAPSULE | Freq: Every day | ORAL | 2 refills | Status: DC
  Filled 2021-05-10: qty 90, 90d supply, fill #0
  Filled 2021-07-31: qty 90, 90d supply, fill #1
  Filled 2021-11-07: qty 90, 90d supply, fill #2

## 2021-05-11 ENCOUNTER — Other Ambulatory Visit (HOSPITAL_COMMUNITY): Payer: Self-pay

## 2021-05-20 DIAGNOSIS — M67911 Unspecified disorder of synovium and tendon, right shoulder: Secondary | ICD-10-CM | POA: Diagnosis not present

## 2021-05-20 DIAGNOSIS — M25511 Pain in right shoulder: Secondary | ICD-10-CM | POA: Diagnosis not present

## 2021-05-24 ENCOUNTER — Ambulatory Visit (INDEPENDENT_AMBULATORY_CARE_PROVIDER_SITE_OTHER)
Admission: RE | Admit: 2021-05-24 | Discharge: 2021-05-24 | Disposition: A | Discharge status: Home / Self Care | Payer: Self-pay | Source: Ambulatory Visit | Attending: Internal Medicine | Admitting: Internal Medicine

## 2021-05-24 DIAGNOSIS — Z9189 Other specified personal risk factors, not elsewhere classified: Secondary | ICD-10-CM

## 2021-05-24 DIAGNOSIS — M7541 Impingement syndrome of right shoulder: Secondary | ICD-10-CM | POA: Diagnosis not present

## 2021-06-07 DIAGNOSIS — M7541 Impingement syndrome of right shoulder: Secondary | ICD-10-CM | POA: Diagnosis not present

## 2021-06-19 DIAGNOSIS — Z01419 Encounter for gynecological examination (general) (routine) without abnormal findings: Secondary | ICD-10-CM | POA: Diagnosis not present

## 2021-06-19 DIAGNOSIS — Z1389 Encounter for screening for other disorder: Secondary | ICD-10-CM | POA: Diagnosis not present

## 2021-06-20 ENCOUNTER — Encounter: Payer: Self-pay | Admitting: Internal Medicine

## 2021-06-20 ENCOUNTER — Other Ambulatory Visit (HOSPITAL_COMMUNITY): Payer: Self-pay

## 2021-06-20 ENCOUNTER — Ambulatory Visit: Payer: 59 | Admitting: Internal Medicine

## 2021-06-20 DIAGNOSIS — I10 Essential (primary) hypertension: Secondary | ICD-10-CM | POA: Diagnosis not present

## 2021-06-20 MED ORDER — HYDROCHLOROTHIAZIDE 12.5 MG PO CAPS
12.5000 mg | ORAL_CAPSULE | Freq: Every day | ORAL | 3 refills | Status: DC
  Filled 2021-06-20: qty 90, 90d supply, fill #0

## 2021-06-20 MED ORDER — METOPROLOL SUCCINATE ER 50 MG PO TB24
50.0000 mg | ORAL_TABLET | Freq: Every day | ORAL | 3 refills | Status: DC
  Filled 2021-06-20: qty 90, 90d supply, fill #0
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
## 2021-06-20 NOTE — Patient Instructions (Signed)
Ok to change the atenolol to the toprol xl 50 mg per day  Ok to restart the HCt 12.5 mg per day  Ok to [facility] on taking the diovan 80 mg per day for this week, but please restart in 1 wk if the BP remains elevated similar to today and recent days  Please call if you change your mind about starting the wegovy, which is $24/mo when approved by [facility] system  Please continue all other medications as before, and refills have been done if requested.  Please have the pharmacy call with any other refills you may need.  Please continue your efforts at being more active, low cholesterol diet, and weight control.  Please keep your appointments with your specialists as you may have planned  Please see Dr Sharlet Salina in 2-3 wks

## 2021-06-24 ENCOUNTER — Encounter: Payer: Self-pay | Admitting: Internal Medicine

## 2021-06-24 NOTE — Assessment & Plan Note (Addendum)
Long d/w pt, doubt low dose diovan was related to recent HA or eye symptoms now resolved, ok to change the atenolol to more cardio specific toprol xl 50 qd, restart the HCT 12.5 mg (she is happy about this) but also check BP at home starting in 1 wk, and to restart the diovan 80 mg as well if not controlled.  I suspect she may have resistent HTN that may not be controlled even on triple lower dose BP med, and may need increased dose of one or more BP meds, and I informed her about this, given she is older and heavier than when she was better controlled with seeing Dr Lenna Gilford several years ago

## 2021-06-27 ENCOUNTER — Other Ambulatory Visit (HOSPITAL_COMMUNITY): Payer: Self-pay
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
## 2021-06-29 ENCOUNTER — Encounter: Payer: Self-pay | Admitting: Family Medicine

## 2021-06-29 ENCOUNTER — Ambulatory Visit: Payer: 59 | Admitting: Family Medicine

## 2021-06-29 DIAGNOSIS — M7062 Trochanteric bursitis, left hip: Secondary | ICD-10-CM | POA: Diagnosis not present

## 2021-06-29 NOTE — Assessment & Plan Note (Signed)
Pain seems to be more secondary to the greater trochanteric bursitis.  Given injection.  Patient also has signs that are consistent with possible piriformis syndrome and we will monitor.  Patient could also within the differential have lumbar radiculopathy.  Discussed different medications but at the moment we will see how patient responds to this as well as home exercises and icing protocol.  Follow-up with me again in 2 months

## 2021-06-29 NOTE — Patient Instructions (Addendum)
Good to see you You received an injection today in [place].   Tell your family to stop it See me again in  6-8 weeks

## 2021-07-01 ENCOUNTER — Encounter (HOSPITAL_COMMUNITY): Payer: Self-pay

## 2021-07-01 ENCOUNTER — Emergency Department (HOSPITAL_COMMUNITY): Payer: 59

## 2021-07-01 ENCOUNTER — Other Ambulatory Visit: Payer: Self-pay

## 2021-07-01 ENCOUNTER — Emergency Department (HOSPITAL_COMMUNITY)
Admission: EM | Admit: 2021-07-01 | Discharge: 2021-07-01 | Disposition: A | Discharge status: Home / Self Care | Payer: 59 | Attending: Emergency Medicine | Admitting: Emergency Medicine

## 2021-07-01 DIAGNOSIS — Z79899 Other long term (current) drug therapy: Secondary | ICD-10-CM | POA: Insufficient documentation

## 2021-07-01 DIAGNOSIS — I10 Essential (primary) hypertension: Secondary | ICD-10-CM | POA: Diagnosis not present

## 2021-07-01 DIAGNOSIS — R079 Chest pain, unspecified: Secondary | ICD-10-CM | POA: Diagnosis not present

## 2021-07-01 DIAGNOSIS — Z7982 Long term (current) use of aspirin: Secondary | ICD-10-CM | POA: Insufficient documentation

## 2021-07-01 LAB — BASIC METABOLIC PANEL
Anion gap: 11 (ref 5–15)
BUN: 24 mg/dL — ABNORMAL HIGH (ref 8–23)
CO2: 25 mmol/L (ref 22–32)
Calcium: 9.6 mg/dL (ref 8.9–10.3)
Chloride: 101 mmol/L (ref 98–111)
Creatinine, Ser: 1.07 mg/dL — ABNORMAL HIGH (ref 0.44–1.00)
GFR, Estimated: 59 mL/min — ABNORMAL LOW (ref >60)
Glucose, Bld: 136 mg/dL — ABNORMAL HIGH (ref 70–99)
Potassium: 3.4 mmol/L — ABNORMAL LOW (ref 3.5–5.1)
Sodium: 137 mmol/L (ref 135–145)

## 2021-07-01 LAB — CBC
HCT: 42.2 % (ref 36.0–46.0)
Hemoglobin: 13.9 g/dL (ref 12.0–15.0)
MCH: 25.6 pg — ABNORMAL LOW (ref 26.0–34.0)
MCHC: 32.9 g/dL (ref 30.0–36.0)
MCV: 77.6 fL — ABNORMAL LOW (ref 80.0–100.0)
Platelets: 420 10*3/uL — ABNORMAL HIGH (ref 150–400)
RBC: 5.44 MIL/uL — ABNORMAL HIGH (ref 3.87–5.11)
RDW: 13.9 % (ref 11.5–15.5)
WBC: 7.9 10*3/uL (ref 4.0–10.5)
nRBC: 0 % (ref 0.0–0.2)

## 2021-07-01 LAB — TROPONIN I (HIGH SENSITIVITY): Troponin I (High Sensitivity): 3 ng/L (ref <18)

## 2021-07-04 ENCOUNTER — Ambulatory Visit: Payer: 59 | Admitting: Internal Medicine

## 2021-07-04 ENCOUNTER — Encounter: Payer: Self-pay | Admitting: Internal Medicine

## 2021-07-04 ENCOUNTER — Other Ambulatory Visit (HOSPITAL_COMMUNITY): Payer: Self-pay

## 2021-07-04 VITALS — BP 150/100 | HR 99 | Resp 18 | Ht 68.0 in | Wt 206.2 lb

## 2021-07-04 DIAGNOSIS — R0789 Other chest pain: Secondary | ICD-10-CM

## 2021-07-04 DIAGNOSIS — I10 Essential (primary) hypertension: Secondary | ICD-10-CM

## 2021-07-04 DIAGNOSIS — H5203 Hypermetropia, bilateral: Secondary | ICD-10-CM | POA: Diagnosis not present

## 2021-07-04 LAB — BRAIN NATRIURETIC PEPTIDE: Pro B Natriuretic peptide (BNP): 8 pg/mL (ref 0.0–100.0)

## 2021-07-04 MED ORDER — ALPRAZOLAM 0.25 MG PO TABS
0.2500 mg | ORAL_TABLET | Freq: Every day | ORAL | 0 refills | Status: DC | PRN
  Filled 2021-07-04: qty 15, 15d supply, fill #0

## 2021-07-04 NOTE — Progress Notes (Signed)
   Subjective:   Patient ID: [name], female    DOB: Jan 20, 1960, 61 y.o.   MRN: [id number]  HPI The patient is a 61 YO female coming in for BP concerns. Still having some atypical chest pain and unpredictable BP readings. No change in diet. Saw another provider at this office and they changed BP regimen and added new medication since our last visit.   Review of Systems  Constitutional: Negative.   HENT: Negative.    Eyes: Negative.   Respiratory:  Negative for cough, chest tightness and shortness of breath.   Cardiovascular:  Positive for chest pain and palpitations. Negative for leg swelling.  Gastrointestinal:  Negative for abdominal distention, abdominal pain, constipation, diarrhea, nausea and vomiting.  Musculoskeletal: Negative.   Skin: Negative.   Neurological: Negative.   Psychiatric/Behavioral: Negative.      Objective:  Physical Exam Constitutional:      Appearance: She is well-developed.  HENT:     Head: Normocephalic and atraumatic.  Cardiovascular:     Rate and Rhythm: Normal rate and regular rhythm.  Pulmonary:     Effort: Pulmonary effort is normal. No respiratory distress.     Breath sounds: Normal breath sounds. No wheezing or rales.  Abdominal:     General: Bowel sounds are normal. There is no distension.     Palpations: Abdomen is soft.     Tenderness: There is no abdominal tenderness. There is no rebound.  Musculoskeletal:     Cervical back: Normal range of motion.  Skin:    General: Skin is warm and dry.  Neurological:     Mental Status: She is alert and oriented to person, place, and time.     Coordination: Coordination normal.     Vitals:   07/04/21 1348  BP: (!) 150/100  Pulse: 99  Resp: 18  SpO2: 99%  Weight: 206 lb 3.2 oz (93.5 kg)  Height: '5\' 8"'$  (1.727 m)    Assessment & Plan:

## 2021-07-05 ENCOUNTER — Ambulatory Visit
Admission: RE | Admit: 2021-07-05 | Discharge: 2021-07-05 | Disposition: A | Discharge status: Home / Self Care | Payer: 59 | Source: Ambulatory Visit | Attending: Internal Medicine | Admitting: Internal Medicine

## 2021-07-05 ENCOUNTER — Other Ambulatory Visit: Payer: Self-pay | Admitting: Internal Medicine

## 2021-07-05 DIAGNOSIS — R0789 Other chest pain: Secondary | ICD-10-CM

## 2021-07-05 DIAGNOSIS — R000 Tachycardia, unspecified: Secondary | ICD-10-CM | POA: Diagnosis not present

## 2021-07-05 DIAGNOSIS — R7989 Other specified abnormal findings of blood chemistry: Secondary | ICD-10-CM | POA: Diagnosis not present

## 2021-07-05 LAB — D-DIMER, QUANTITATIVE: D-Dimer, Quant: 1.03 mcg/mL FEU — ABNORMAL HIGH (ref <0.50)

## 2021-07-05 MED ORDER — IOPAMIDOL (ISOVUE-300) INJECTION 61%
75.0000 mL | Freq: Once | INTRAVENOUS | Status: AC | PRN
  Administered 2021-07-05: 75 mL via INTRAVENOUS

## 2021-07-07 NOTE — Assessment & Plan Note (Addendum)
BP is moderately elevated in office and has been severely elevated to 200/100 at home for unclear reason. She is currently taking metoprolol xl 50 mg daily and will increase to 50 mg qam and 25 mg qpm. In 1 week if BP still elevated will increase to 50 mg xl BID. Will d/c valsartan as she is not at maximal dosing of either of her 2 BP medications so we will optimize first before adding 3rd agent.

## 2021-07-07 NOTE — Assessment & Plan Note (Signed)
Checking d-dimer and BNP to rule out PE and heart failure given strong family history of heart problems and chest pain. Referral to cardiology given history and BP for consideration of further testing.

## 2021-07-21 IMAGING — DX DG THORACIC SPINE 2V
2 series · 2 of 2 positions shown · non-contrast
Comparison: None.

CLINICAL DATA: Upper back pain

EXAM:
THORACIC SPINE 2 VIEWS

[t-spine ap]
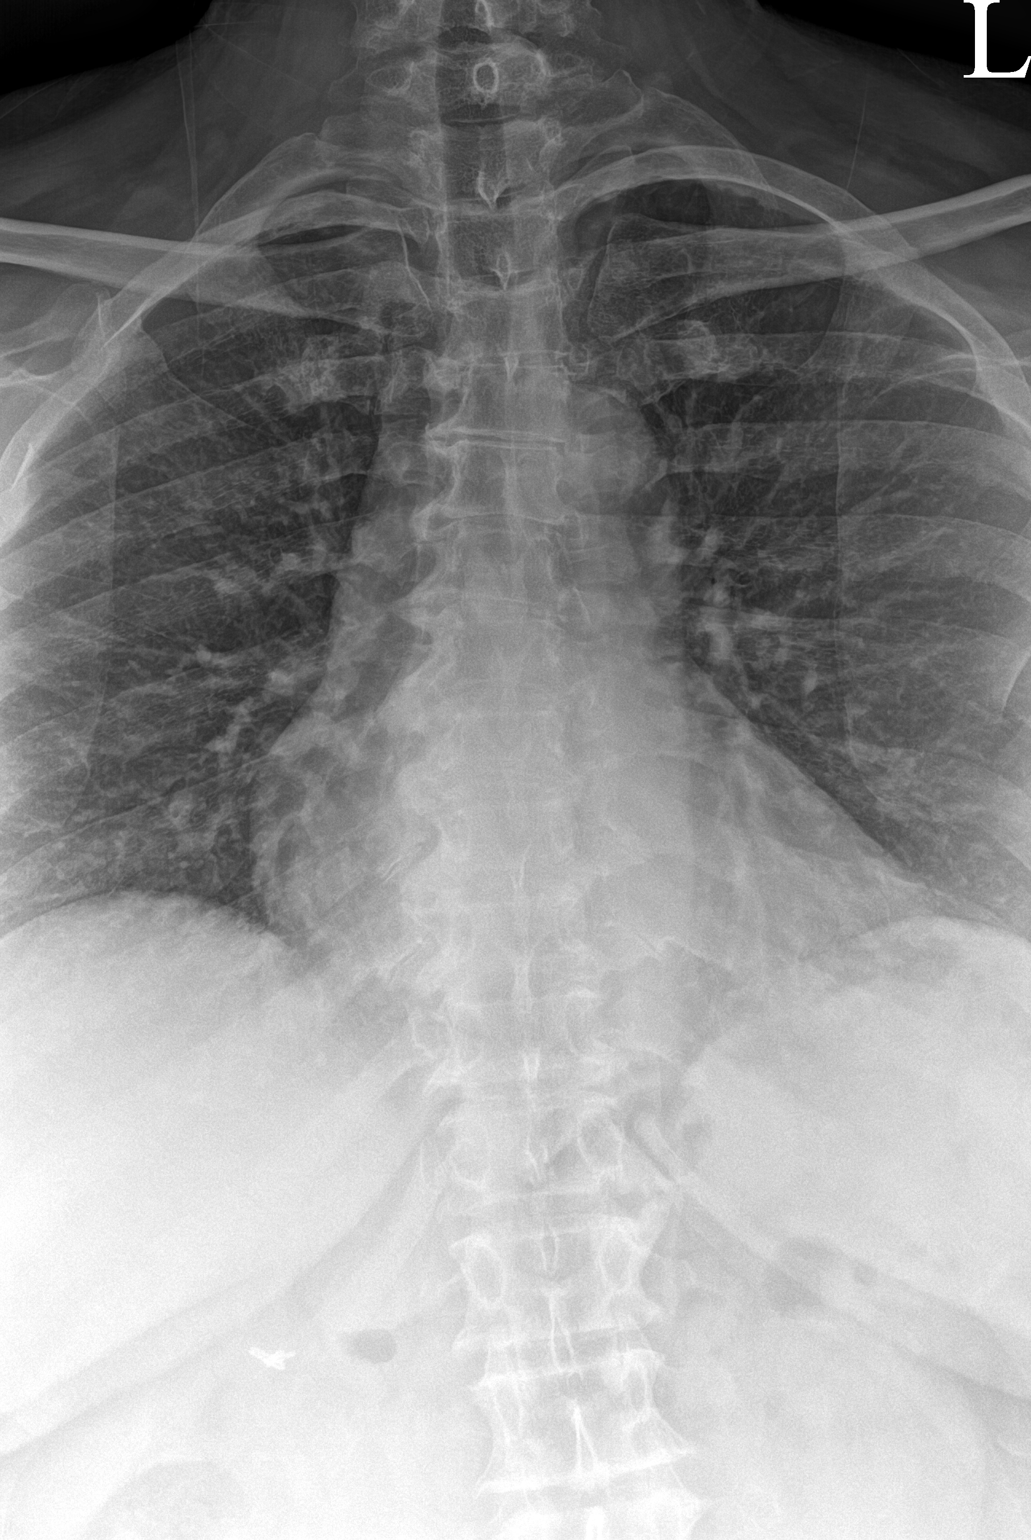

[t-spine lat]
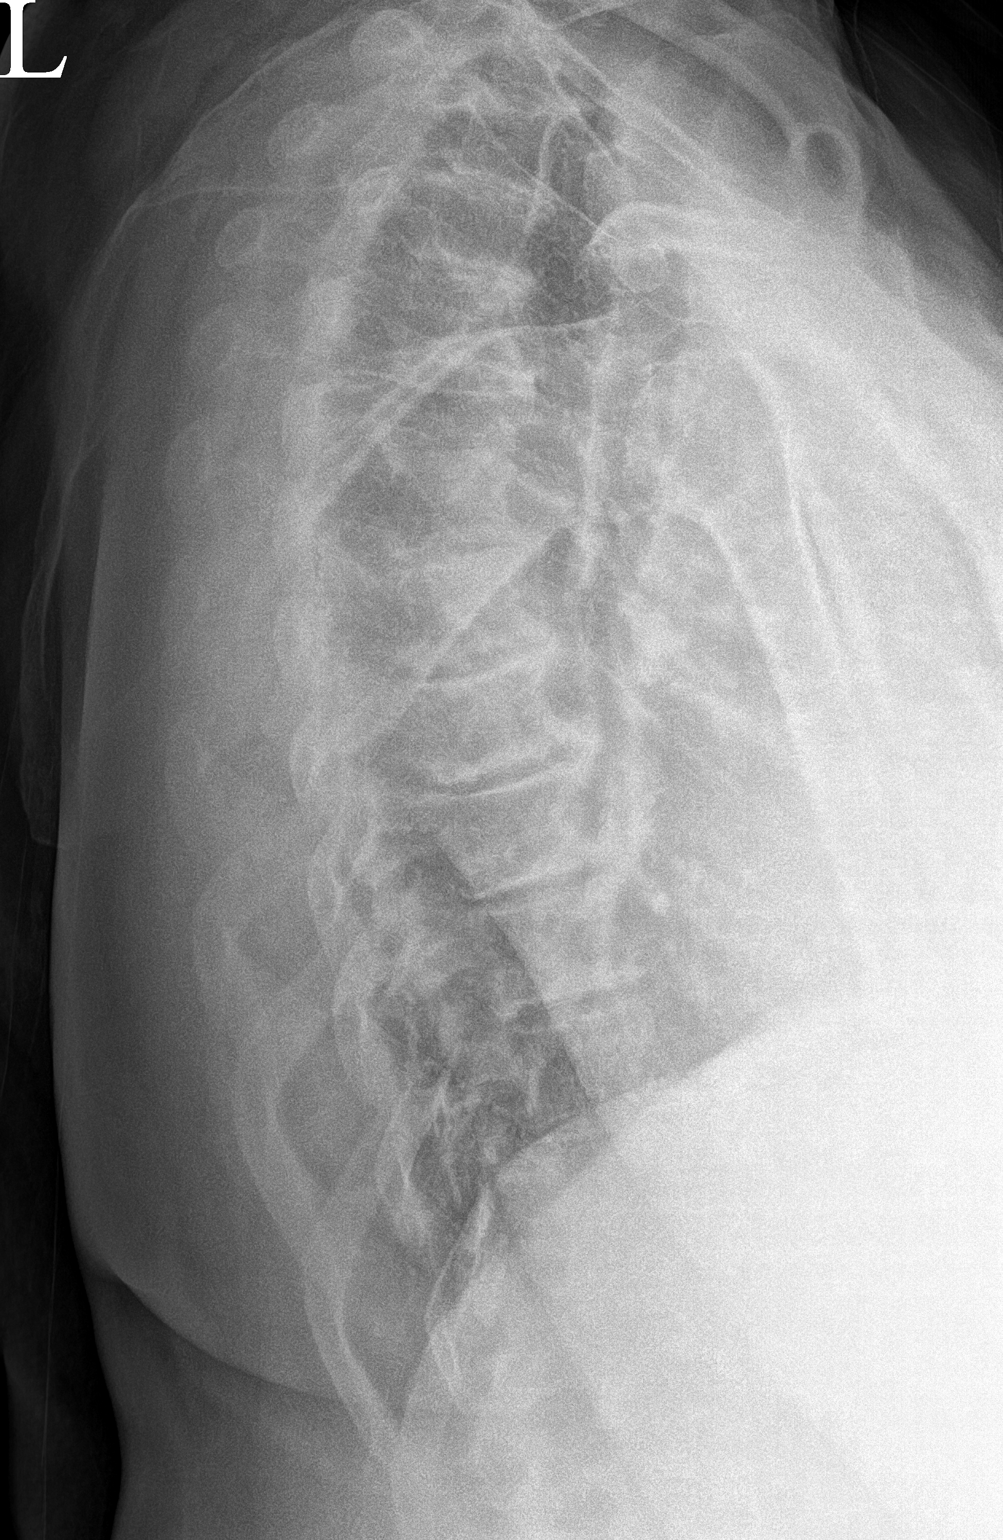

[2 of 2 positions shown; findings below may reference images not displayed]

FINDINGS: Diffuse degenerative spurring throughout the thoracic spine. Normal
alignment. No fracture or focal bone lesion.
IMPRESSION: Degenerative changes.  No acute bony abnormality.

## 2021-07-25 ENCOUNTER — Other Ambulatory Visit (HOSPITAL_COMMUNITY): Payer: Self-pay

## 2021-07-25 ENCOUNTER — Ambulatory Visit: Payer: 59 | Admitting: Internal Medicine

## 2021-07-25 ENCOUNTER — Encounter: Payer: Self-pay | Admitting: Internal Medicine

## 2021-07-25 VITALS — BP 130/92 | HR 88 | Ht 68.0 in | Wt 204.8 lb

## 2021-07-25 DIAGNOSIS — Z79899 Other long term (current) drug therapy: Secondary | ICD-10-CM

## 2021-07-25 DIAGNOSIS — I10 Essential (primary) hypertension: Secondary | ICD-10-CM | POA: Diagnosis not present

## 2021-07-25 MED ORDER — ATENOLOL 50 MG PO TABS
50.0000 mg | ORAL_TABLET | Freq: Every day | ORAL | 3 refills | Status: DC
  Filled 2021-07-25: qty 90, 90d supply, fill #0
  Filled 2021-10-08 – 2021-10-16 (×2): qty 90, 90d supply, fill #1
  Filled 2022-01-01 – 2022-01-09 (×3): qty 90, 90d supply, fill #2
  Filled 2022-04-02 – 2022-04-09 (×2): qty 90, 90d supply, fill #3
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
## 2021-07-25 NOTE — Patient Instructions (Signed)
Medication Instructions:  Atenolol 50 mg daily  *If you need a refill on your cardiac medications before your next appointment, please call your pharmacy*   Lab Work: Bmet today  If you have labs (blood work) drawn today and your tests are completely normal, you will receive your results only by: [place] (if you have MyChart) OR A paper copy in the mail If you have any lab test that is abnormal or we need to change your treatment, we will call you to review the results.   Testing/Procedures:    Follow-Up: At [facility], you and your health needs are our priority.  As part of our continuing mission to provide you with exceptional heart care, we have created designated Provider Care Teams.  These Care Teams include your primary Cardiologist (physician) and Advanced Practice Providers (APPs -  Physician Assistants and Nurse Practitioners) who all work together to provide you with the care you need, when you need it.  We recommend signing up for the patient portal called "MyChart".  Sign up information is provided on this After Visit Summary.  MyChart is used to connect with patients for Virtual Visits (Telemedicine).  Patients are able to view lab/test results, encounter notes, upcoming appointments, etc.  Non-urgent messages can be sent to your provider as well.   To learn more about what you can do with MyChart, go to [email or web address].     Other Instructions   Important Information About Sugar

## 2021-07-26 LAB — BASIC METABOLIC PANEL
BUN/Creatinine Ratio: 24 (ref 12–28)
BUN: 24 mg/dL (ref 8–27)
CO2: 28 mmol/L (ref 20–29)
Calcium: 10.1 mg/dL (ref 8.7–10.3)
Chloride: 101 mmol/L (ref 96–106)
Creatinine, Ser: 1.01 mg/dL — ABNORMAL HIGH (ref 0.57–1.00)
Glucose: 110 mg/dL — ABNORMAL HIGH (ref 70–99)
Potassium: 4.7 mmol/L (ref 3.5–5.2)
Sodium: 140 mmol/L (ref 134–144)
eGFR: 63 mL/min/{1.73_m2} (ref >59)

## 2021-07-27 ENCOUNTER — Telehealth: Payer: Self-pay

## 2021-07-27 ENCOUNTER — Other Ambulatory Visit (HOSPITAL_COMMUNITY): Payer: Self-pay

## 2021-07-27 ENCOUNTER — Encounter: Payer: Self-pay | Admitting: Internal Medicine

## 2021-07-27 MED ORDER — AMLODIPINE BESYLATE 5 MG PO TABS
ORAL_TABLET | ORAL | 3 refills | Status: DC
  Filled 2021-07-27: qty 90, 90d supply, fill #0
  Filled 2021-10-08: qty 90, 90d supply, fill #1
  Filled 2022-01-01: qty 90, 90d supply, fill #2
  Filled 2022-04-02 – 2022-04-09 (×3): qty 90, 90d supply, fill #3

## 2021-07-27 NOTE — Telephone Encounter (Signed)
Left a message for the pt and then sent her a My Chart message re: her lab results and Dr Harrington Challenger' recommendations.

## 2021-07-27 NOTE — Telephone Encounter (Signed)
-----   Message from Dorris Carnes V, MD sent at 07/26/2021 10:32 PM EDT ----- Electrolytes and kidney function are OK On review would recomm trying amlodipine to help BP   5 mg   Start 1/2 tab for a few days the n 5 mg daily   Keep track of BP   Call or send msg in mychart with response

## 2021-07-27 NOTE — Telephone Encounter (Signed)
  Pt is returning call, she has questions about her new medications

## 2021-07-31 ENCOUNTER — Other Ambulatory Visit (HOSPITAL_COMMUNITY): Payer: Self-pay

## 2021-07-31 NOTE — Telephone Encounter (Signed)
I Spoke with the pt and starting today she will take the full Amlodipine 5 mg every day in the morning and check her BP in the early afternoon after resting for a few minutes and then she will My Chart a list of her readings for Dr Harrington Challenger to review.

## 2021-08-03 ENCOUNTER — Encounter: Payer: Self-pay | Admitting: Internal Medicine

## 2021-08-16 ENCOUNTER — Ambulatory Visit: Payer: 59 | Admitting: Family Medicine

## 2021-08-29 ENCOUNTER — Encounter: Payer: Self-pay | Admitting: Podiatry

## 2021-08-29 ENCOUNTER — Other Ambulatory Visit (HOSPITAL_COMMUNITY): Payer: Self-pay

## 2021-08-29 ENCOUNTER — Ambulatory Visit (INDEPENDENT_AMBULATORY_CARE_PROVIDER_SITE_OTHER): Payer: 59

## 2021-08-29 ENCOUNTER — Ambulatory Visit: Payer: 59 | Admitting: Podiatry

## 2021-08-29 DIAGNOSIS — M778 Other enthesopathies, not elsewhere classified: Secondary | ICD-10-CM

## 2021-08-29 DIAGNOSIS — G5763 Lesion of plantar nerve, bilateral lower limbs: Secondary | ICD-10-CM

## 2021-08-29 MED ORDER — MELOXICAM 15 MG PO TABS
15.0000 mg | ORAL_TABLET | Freq: Every day | ORAL | 0 refills | Status: DC
  Filled 2021-08-29: qty 30, 30d supply, fill #0
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
## 2021-09-13 ENCOUNTER — Ambulatory Visit: Payer: 59 | Attending: Internal Medicine | Admitting: Internal Medicine

## 2021-09-13 ENCOUNTER — Encounter: Payer: Self-pay | Admitting: Internal Medicine

## 2021-09-13 VITALS — BP 126/78 | HR 90 | Ht 68.0 in | Wt 207.8 lb

## 2021-09-13 DIAGNOSIS — Z79899 Other long term (current) drug therapy: Secondary | ICD-10-CM

## 2021-09-13 DIAGNOSIS — E785 Hyperlipidemia, unspecified: Secondary | ICD-10-CM

## 2021-09-13 NOTE — Patient Instructions (Signed)
Medication Instructions:   *If you need a refill on your cardiac medications before your next appointment, please call your pharmacy*   Lab Work: [place] If you have labs (blood work) drawn today and your tests are completely normal, you will receive your results only by: [place] (if you have MyChart) OR A paper copy in the mail If you have any lab test that is abnormal or we need to change your treatment, we will call you to review the results.   Testing/Procedures: NONE   Follow-Up: At [facility], you and your health needs are our priority.  As part of our continuing mission to provide you with exceptional heart care, we have created designated Provider Care Teams.  These Care Teams include your primary Cardiologist (physician) and Advanced Practice Providers (APPs -  Physician Assistants and Nurse Practitioners) who all work together to provide you with the care you need, when you need it.  We recommend signing up for the patient portal called "MyChart".  Sign up information is provided on this After Visit Summary.  MyChart is used to connect with patients for Virtual Visits (Telemedicine).  Patients are able to view lab/test results, encounter notes, upcoming appointments, etc.  Non-urgent messages can be sent to your provider as well.   To learn more about what you can do with MyChart, go to [email or web address].    Your next appointment:   7 month(s)  The format for your next appointment:   In Person DR PAULA ROS    Other Instructions   Important Information About Sugar

## 2021-09-14 LAB — NMR, LIPOPROFILE
Cholesterol, Total: 202 mg/dL — ABNORMAL HIGH (ref 100–199)
HDL Particle Number: 40.9 umol/L (ref >=30.5)
HDL-C: 69 mg/dL (ref >39)
LDL Particle Number: 1433 nmol/L — ABNORMAL HIGH (ref <1000)
LDL Size: 20.8 nm (ref >20.5)
LDL-C (NIH Calc): 123 mg/dL — ABNORMAL HIGH (ref 0–99)
LP-IR Score: <25 (ref <=45)
Small LDL Particle Number: 535 nmol/L — ABNORMAL HIGH (ref <=527)
Triglycerides: 54 mg/dL (ref 0–149)

## 2021-09-14 LAB — APOLIPOPROTEIN B: Apolipoprotein B: 91 mg/dL — ABNORMAL HIGH (ref <90)

## 2021-09-14 LAB — LIPOPROTEIN A (LPA): Lipoprotein (a): 54.3 nmol/L (ref <75.0)

## 2021-09-15 ENCOUNTER — Telehealth: Payer: Self-pay

## 2021-09-15 ENCOUNTER — Telehealth: Payer: Self-pay | Admitting: Internal Medicine

## 2021-09-15 ENCOUNTER — Other Ambulatory Visit (HOSPITAL_COMMUNITY): Payer: Self-pay

## 2021-09-15 DIAGNOSIS — I10 Essential (primary) hypertension: Secondary | ICD-10-CM

## 2021-09-15 DIAGNOSIS — E785 Hyperlipidemia, unspecified: Secondary | ICD-10-CM

## 2021-09-15 DIAGNOSIS — Z79899 Other long term (current) drug therapy: Secondary | ICD-10-CM

## 2021-09-15 MED ORDER — ROSUVASTATIN CALCIUM 20 MG PO TABS
20.0000 mg | ORAL_TABLET | Freq: Every day | ORAL | 3 refills | Status: DC
  Filled 2021-09-15: qty 90, 90d supply, fill #0
  Filled 2021-11-25: qty 90, 90d supply, fill #1
  Filled 2022-02-20: qty 90, 90d supply, fill #2

## 2021-09-15 NOTE — Telephone Encounter (Signed)
-----   Message from Fay Records, MD sent at 09/14/2021  5:28 PM EDT ----- LDL is 123 with particles of 1433    Goal would be LDL at least below 90, maybe lower and particles down Apo B is mildly elevated   Usually reflects diet input Lpa is low (good)  Recomm   I would recomm switching to Crestor 20 mg     Stop pravastatin Follow up Lipomed and ApoB in 12 wks  Diet:   mediterranean type, limit carbs, processed foods   I left VM saying someone would call

## 2021-09-15 NOTE — Telephone Encounter (Signed)
Follow Up:         Patient is returning Dr Harrington Challenger call from yesterday, concerning her lab results.

## 2021-09-15 NOTE — Telephone Encounter (Signed)
Fay Records, MD  09/14/2021  5:28 PM EDT Back to Top    LDL is 123 with particles of 1433    Goal would be LDL at least below 90, maybe lower and particles down Apo B is mildly elevated   Usually reflects diet input Lpa is low (good)   Recomm   I would recomm switching to Crestor 20 mg     Stop pravastatin Follow up Lipomed and ApoB in 12 wks  Diet:   mediterranean type, limit carbs, processed foods    I left VM saying someone would call   Called patient with lab results and Dr. Harrington Challenger' advisement. Patient will switch to Crestor 20 mg and will get lab work in 12 weeks. Patient verbalized understanding.   Patient wants to know about taking the Mobic. Patient stated Dr. Harrington Challenger would get back to her on taking it or not. Patient stated her pharmacy told her the Mobic would cause her BP to go up. Will send message to Dr. Harrington Challenger for advisement and find out if patient needs to be fasting for lab work in 12 weeks.

## 2021-09-15 NOTE — Telephone Encounter (Signed)
Also, from Dr Harrington Challenger:   Re: Mobic.Marland KitchenMarland KitchenMarland KitchenChecked with Hughie Closs.   Felt it was OK to try   She sees a lot of patients.   Hasnt noticed much change.   Would recomm BMET in 10 days from starting  Pt to have labs for Mobic at [place] 09/26/21 and nmr/ apo b 12/15/21.    Pt advised.

## 2021-09-18 NOTE — Addendum Note (Signed)
Addended by: Stephani Police on: 09/18/2021 02:07 PM   Modules accepted: Orders

## 2021-09-21 NOTE — Telephone Encounter (Signed)
-----   Message from Fay Records, MD sent at 09/21/2021  5:55 AM EDT ----- Besides anti inflammatories for Morton's neuroma, let patient know:   ice, shoes with increased room at toes, orthotics   If continues follow with foot person for possible steroids ----- Message ----- From: Fay Records, MD Sent: 09/15/2021  11:15 AM EDT To: Fay Records, MD  Mortons neuroma

## 2021-09-21 NOTE — Telephone Encounter (Signed)
Patient should have fasting lipomed in 8 to 12 wk  Re mobic   I think this question was answered already  OK to try  Follow BP (reviewed with pharmacy)   Pt already advised and labs planned.

## 2021-09-25 ENCOUNTER — Encounter: Payer: Self-pay | Admitting: Internal Medicine

## 2021-09-25 ENCOUNTER — Other Ambulatory Visit: Payer: 59

## 2021-09-25 DIAGNOSIS — E785 Hyperlipidemia, unspecified: Secondary | ICD-10-CM

## 2021-09-25 DIAGNOSIS — Z79899 Other long term (current) drug therapy: Secondary | ICD-10-CM

## 2021-09-25 DIAGNOSIS — I10 Essential (primary) hypertension: Secondary | ICD-10-CM

## 2021-09-26 ENCOUNTER — Ambulatory Visit: Payer: 59 | Attending: Interventional Cardiology

## 2021-09-26 DIAGNOSIS — I10 Essential (primary) hypertension: Secondary | ICD-10-CM

## 2021-09-26 DIAGNOSIS — Z79899 Other long term (current) drug therapy: Secondary | ICD-10-CM | POA: Diagnosis not present

## 2021-09-26 DIAGNOSIS — E785 Hyperlipidemia, unspecified: Secondary | ICD-10-CM

## 2021-09-26 LAB — BASIC METABOLIC PANEL
BUN/Creatinine Ratio: 19 (ref 12–28)
BUN: 20 mg/dL (ref 8–27)
CO2: 27 mmol/L (ref 20–29)
Calcium: 9.7 mg/dL (ref 8.7–10.3)
Chloride: 105 mmol/L (ref 96–106)
Creatinine, Ser: 1.06 mg/dL — ABNORMAL HIGH (ref 0.57–1.00)
Glucose: 84 mg/dL (ref 70–99)
Potassium: 4.2 mmol/L (ref 3.5–5.2)
Sodium: 144 mmol/L (ref 134–144)
eGFR: 59 mL/min/{1.73_m2} — ABNORMAL LOW (ref >59)

## 2021-10-03 ENCOUNTER — Encounter: Payer: Self-pay | Admitting: Podiatry

## 2021-10-03 ENCOUNTER — Ambulatory Visit: Payer: 59 | Admitting: Podiatry

## 2021-10-03 ENCOUNTER — Other Ambulatory Visit (HOSPITAL_COMMUNITY): Payer: Self-pay

## 2021-10-03 DIAGNOSIS — M778 Other enthesopathies, not elsewhere classified: Secondary | ICD-10-CM | POA: Diagnosis not present

## 2021-10-03 DIAGNOSIS — G5763 Lesion of plantar nerve, bilateral lower limbs: Secondary | ICD-10-CM | POA: Diagnosis not present

## 2021-10-03 MED ORDER — MELOXICAM 15 MG PO TABS
15.0000 mg | ORAL_TABLET | Freq: Every day | ORAL | 0 refills | Status: DC
  Filled 2021-10-03: qty 30, 30d supply, fill #0
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
## 2021-10-09 ENCOUNTER — Other Ambulatory Visit (HOSPITAL_COMMUNITY): Payer: Self-pay

## 2021-10-17 ENCOUNTER — Other Ambulatory Visit (HOSPITAL_COMMUNITY): Payer: Self-pay

## 2021-11-08 ENCOUNTER — Other Ambulatory Visit (HOSPITAL_COMMUNITY): Payer: Self-pay

## 2021-11-12 IMAGING — US US ABDOMEN LIMITED
1 series · 15 of 25 positions shown · non-contrast
Comparison: Remote abdominal ultrasound 09/11/2009

CLINICAL DATA: Right upper quadrant abdominal pain.

EXAM:
ULTRASOUND ABDOMEN LIMITED RIGHT UPPER QUADRANT

[Series 1: us abdomen limited ruq mc & wl · 15 of 32 slices shown]
[im 1/32]
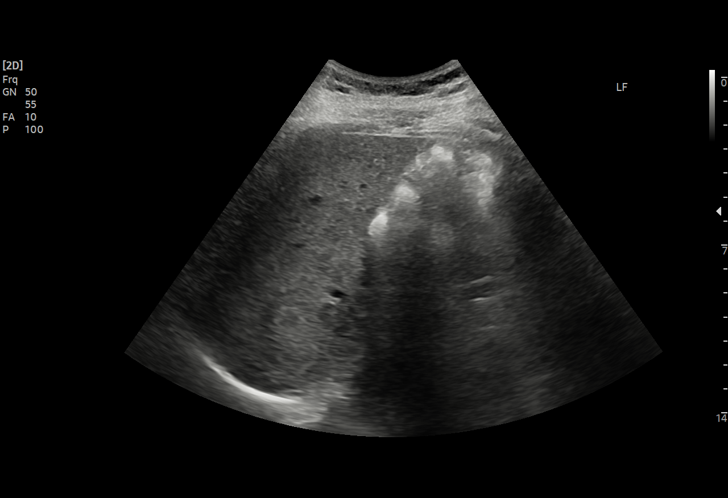
[im 3/32]
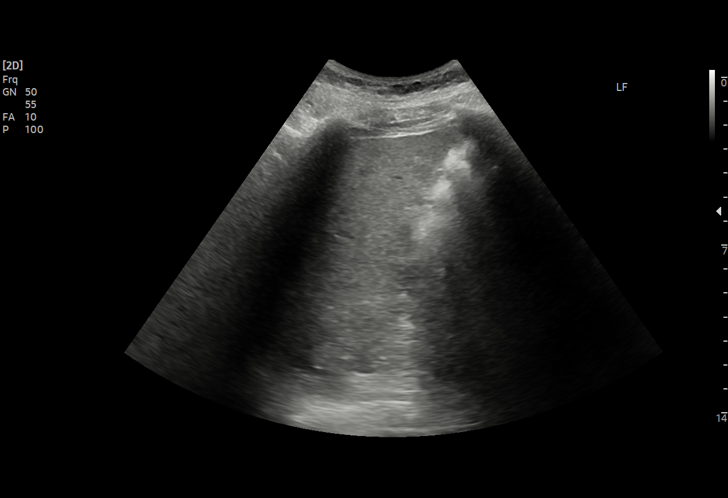
[im 6/32]
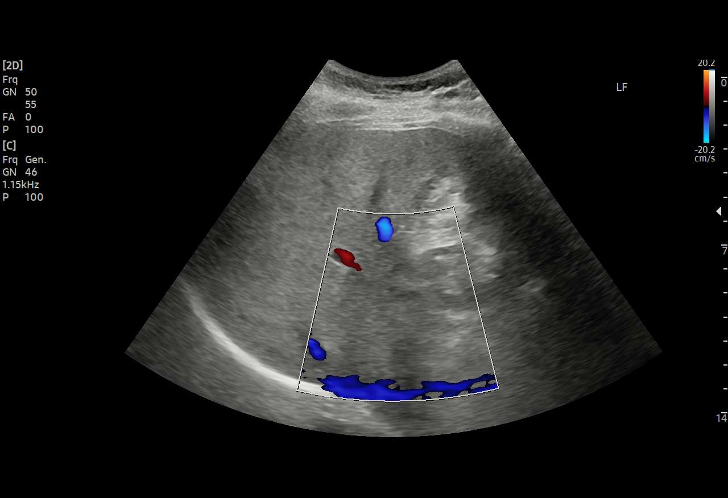
[im 7/32]
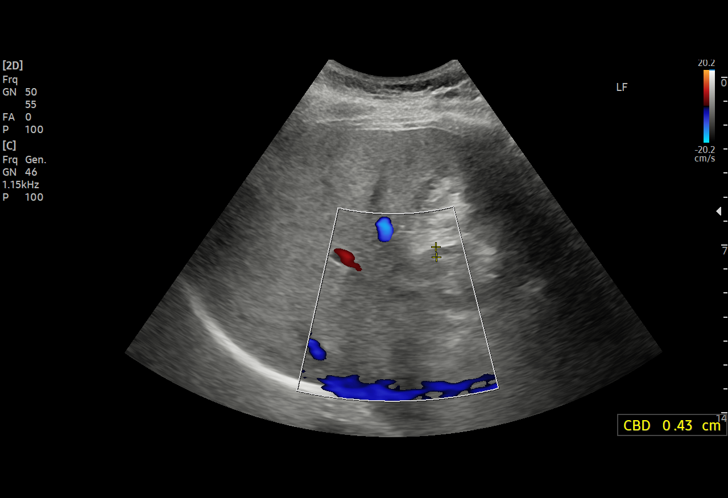
[im 10/32]
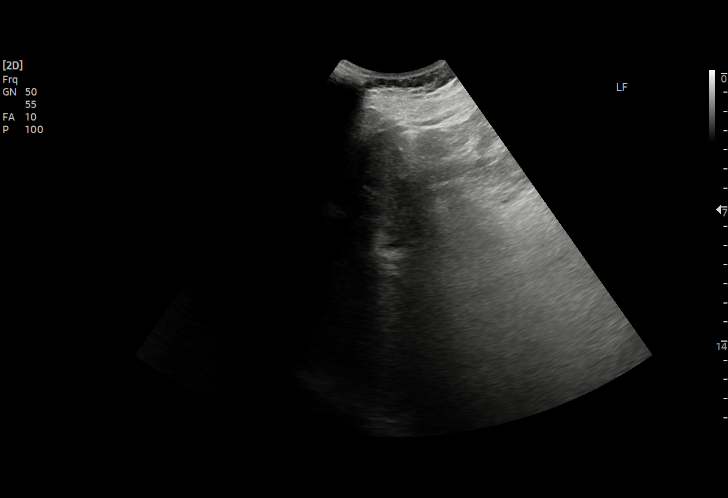
[im 12/32]
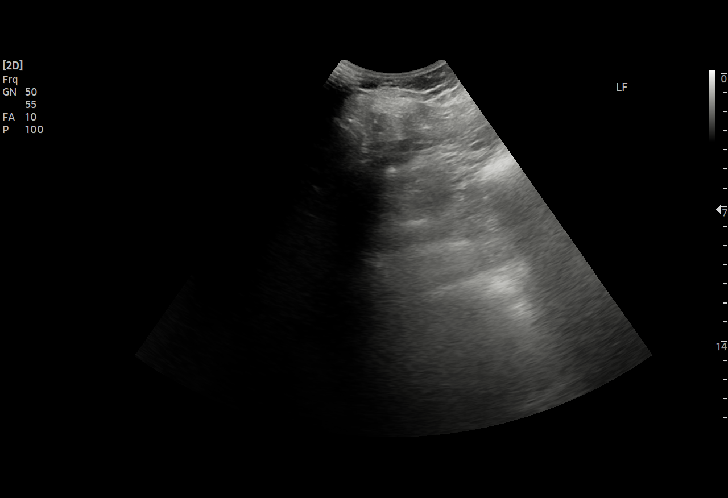
[im 13/32]
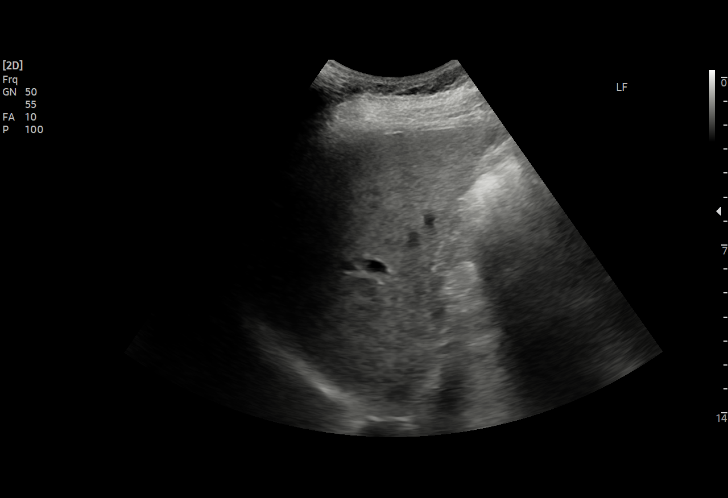
[im 16/32]
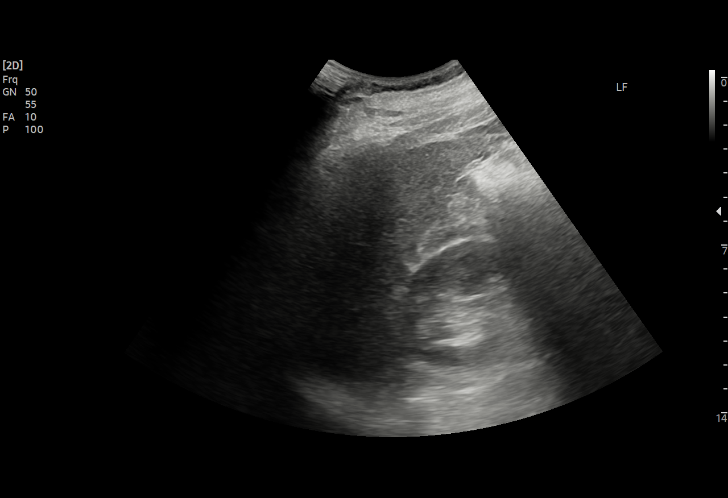
[im 19/32]
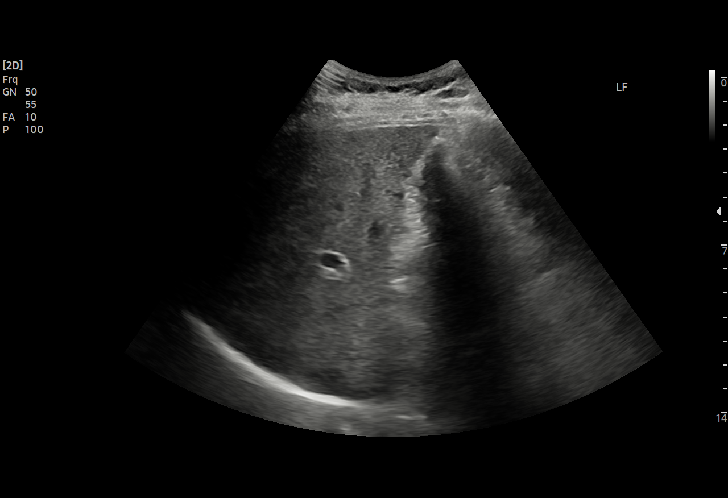
[im 20/32]
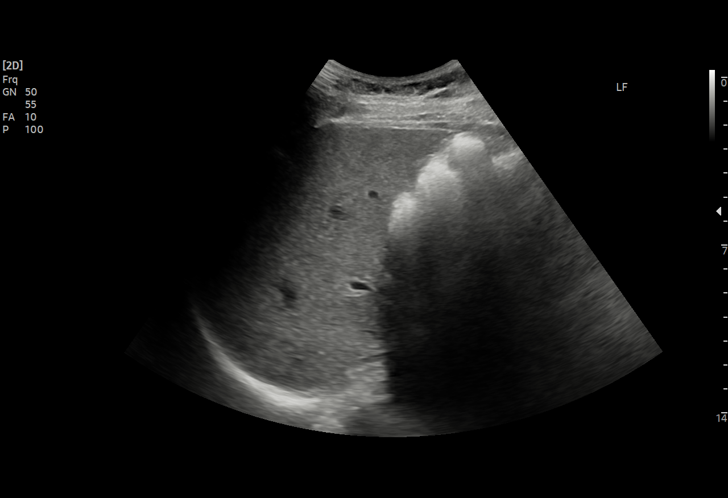
[im 22/32]
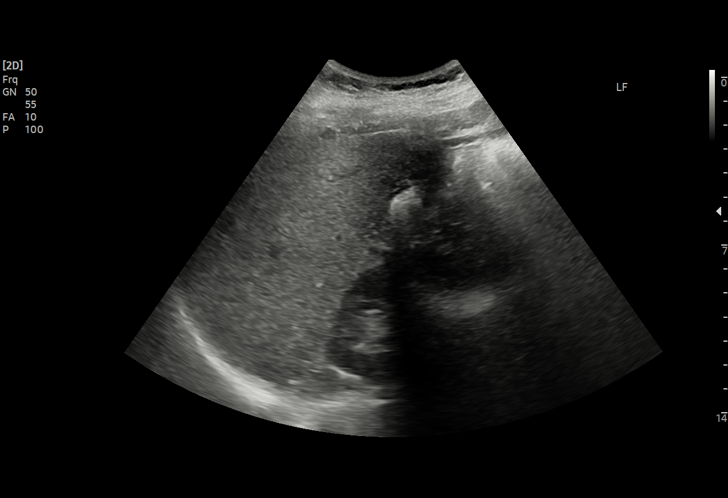
[im 25/32]
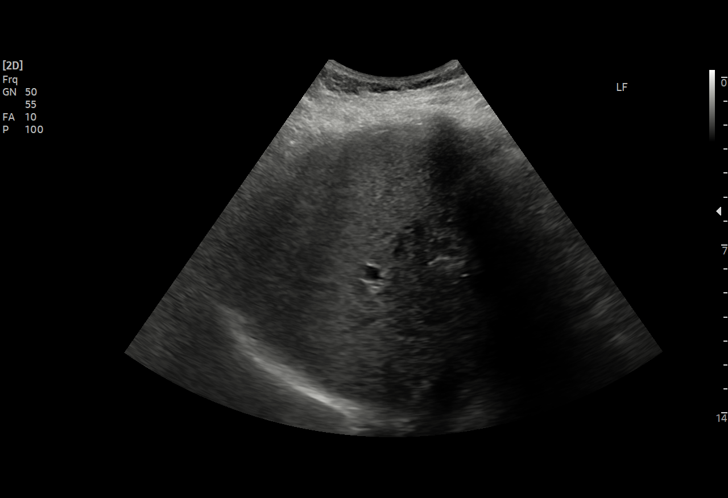
[im 26/32]
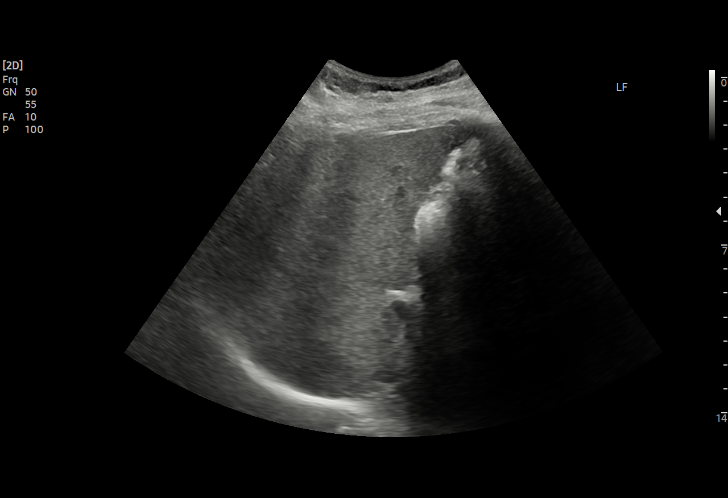
[im 29/32]
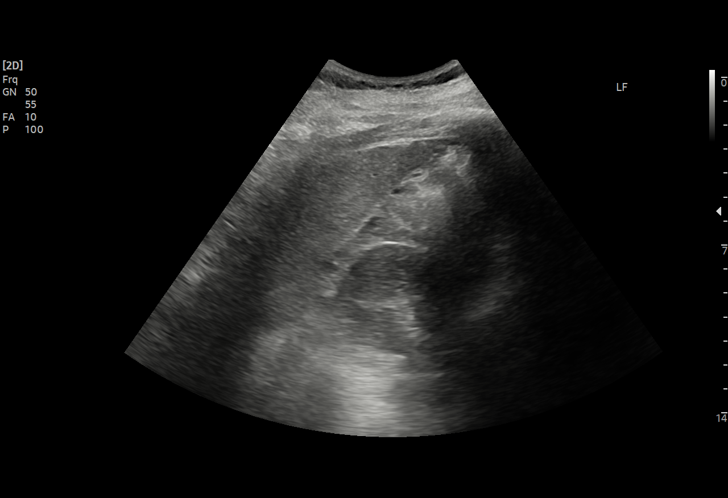
[im 32/32]
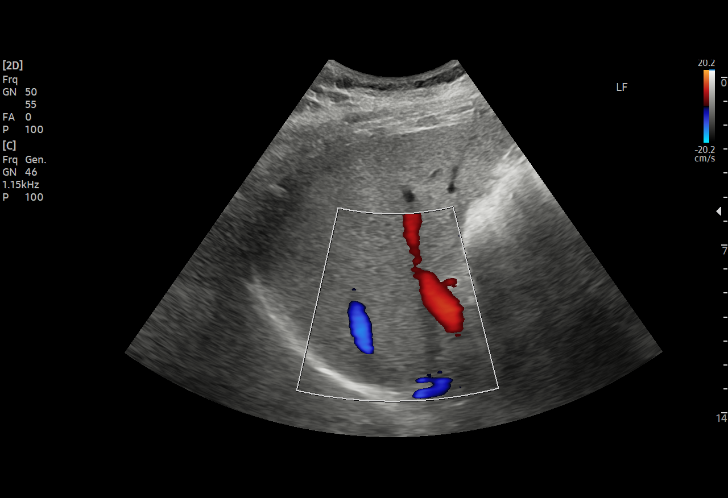

[15 of 25 positions shown; findings below may reference images not displayed]

FINDINGS: Gallbladder:

Surgically absent.

Common bile duct:

Diameter: 4 mm.

Liver:

Heterogeneously increased in parenchymal echogenicity. The previous
hyperechoic lesion on remote exams is not definitively seen.
Portions of the liver are obscured by shadowing bowel gas. Portal
vein is patent on color Doppler imaging with normal direction of
blood flow towards the liver.

Other: No right upper quadrant ascites.
IMPRESSION: 1. Post cholecystectomy without biliary dilatation.
2. Mild hepatic steatosis. Previous small hyperechoic liver lesion
is not seen on the current exam.

## 2021-11-20 ENCOUNTER — Encounter: Payer: Self-pay | Admitting: Internal Medicine

## 2021-11-22 NOTE — Telephone Encounter (Signed)
Should be set up for lipids   Can come in am

## 2021-11-25 ENCOUNTER — Other Ambulatory Visit (HOSPITAL_COMMUNITY): Payer: Self-pay

## 2021-11-28 ENCOUNTER — Other Ambulatory Visit (HOSPITAL_COMMUNITY): Payer: Self-pay

## 2021-12-01 ENCOUNTER — Other Ambulatory Visit (HOSPITAL_COMMUNITY): Payer: Self-pay

## 2021-12-15 ENCOUNTER — Ambulatory Visit: Payer: 59 | Attending: Internal Medicine

## 2021-12-15 ENCOUNTER — Other Ambulatory Visit: Payer: 59

## 2021-12-15 DIAGNOSIS — I10 Essential (primary) hypertension: Secondary | ICD-10-CM | POA: Diagnosis not present

## 2021-12-15 DIAGNOSIS — Z79899 Other long term (current) drug therapy: Secondary | ICD-10-CM

## 2021-12-15 DIAGNOSIS — E785 Hyperlipidemia, unspecified: Secondary | ICD-10-CM | POA: Diagnosis not present

## 2021-12-17 LAB — NMR, LIPOPROFILE
Cholesterol, Total: 148 mg/dL (ref 100–199)
HDL Particle Number: 39.1 umol/L (ref >=30.5)
HDL-C: 63 mg/dL (ref >39)
LDL Particle Number: 907 nmol/L (ref <1000)
LDL Size: 20.3 nm — ABNORMAL LOW (ref >20.5)
LDL-C (NIH Calc): 74 mg/dL (ref 0–99)
LP-IR Score: 25 (ref <=45)
Small LDL Particle Number: 468 nmol/L (ref <=527)
Triglycerides: 51 mg/dL (ref 0–149)

## 2021-12-17 LAB — APOLIPOPROTEIN B: Apolipoprotein B: 69 mg/dL (ref <90)

## 2022-01-02 ENCOUNTER — Other Ambulatory Visit (HOSPITAL_COMMUNITY): Payer: Self-pay

## 2022-01-03 ENCOUNTER — Other Ambulatory Visit: Payer: Self-pay

## 2022-01-04 ENCOUNTER — Other Ambulatory Visit: Payer: Self-pay

## 2022-01-04 ENCOUNTER — Other Ambulatory Visit (HOSPITAL_COMMUNITY): Payer: Self-pay

## 2022-01-09 ENCOUNTER — Other Ambulatory Visit (HOSPITAL_COMMUNITY): Payer: Self-pay

## 2022-01-15 ENCOUNTER — Other Ambulatory Visit (HOSPITAL_COMMUNITY): Payer: Self-pay

## 2022-02-06 ENCOUNTER — Other Ambulatory Visit (HOSPITAL_COMMUNITY): Payer: Self-pay

## 2022-02-06 ENCOUNTER — Ambulatory Visit (INDEPENDENT_AMBULATORY_CARE_PROVIDER_SITE_OTHER): Payer: Commercial Managed Care - PPO | Admitting: Internal Medicine

## 2022-02-06 ENCOUNTER — Encounter: Payer: Self-pay | Admitting: Internal Medicine

## 2022-02-06 VITALS — BP 130/82 | HR 80 | Temp 98.3°F | Ht 68.0 in | Wt 196.0 lb

## 2022-02-06 DIAGNOSIS — K219 Gastro-esophageal reflux disease without esophagitis: Secondary | ICD-10-CM

## 2022-02-06 DIAGNOSIS — Z0000 Encounter for general adult medical examination without abnormal findings: Secondary | ICD-10-CM

## 2022-02-06 DIAGNOSIS — E782 Mixed hyperlipidemia: Secondary | ICD-10-CM

## 2022-02-06 DIAGNOSIS — M25511 Pain in right shoulder: Secondary | ICD-10-CM

## 2022-02-06 DIAGNOSIS — I10 Essential (primary) hypertension: Secondary | ICD-10-CM | POA: Diagnosis not present

## 2022-02-06 DIAGNOSIS — R7303 Prediabetes: Secondary | ICD-10-CM | POA: Diagnosis not present

## 2022-02-06 LAB — COMPREHENSIVE METABOLIC PANEL
ALT: 41 U/L — ABNORMAL HIGH (ref 0–35)
AST: 29 U/L (ref 0–37)
Albumin: 4.8 g/dL (ref 3.5–5.2)
Alkaline Phosphatase: 67 U/L (ref 39–117)
BUN: 19 mg/dL (ref 6–23)
CO2: 31 mEq/L (ref 19–32)
Calcium: 10 mg/dL (ref 8.4–10.5)
Chloride: 102 mEq/L (ref 96–112)
Creatinine, Ser: 0.86 mg/dL (ref 0.40–1.20)
GFR: 72.06 mL/min (ref >60.00)
Glucose, Bld: 107 mg/dL — ABNORMAL HIGH (ref 70–99)
Potassium: 4.5 mEq/L (ref 3.5–5.1)
Sodium: 138 mEq/L (ref 135–145)
Total Bilirubin: 0.7 mg/dL (ref 0.2–1.2)
Total Protein: 7.6 g/dL (ref 6.0–8.3)

## 2022-02-06 LAB — CBC
HCT: 41.5 % (ref 36.0–46.0)
Hemoglobin: 13.8 g/dL (ref 12.0–15.0)
MCHC: 33.2 g/dL (ref 30.0–36.0)
MCV: 76.8 fl — ABNORMAL LOW (ref 78.0–100.0)
Platelets: 327 10*3/uL (ref 150.0–400.0)
RBC: 5.41 Mil/uL — ABNORMAL HIGH (ref 3.87–5.11)
RDW: 13.9 % (ref 11.5–15.5)
WBC: 4.9 10*3/uL (ref 4.0–10.5)

## 2022-02-06 LAB — HEMOGLOBIN A1C: Hgb A1c MFr Bld: 6 % (ref 4.6–6.5)

## 2022-02-06 MED ORDER — PREDNISONE 20 MG PO TABS
40.0000 mg | ORAL_TABLET | Freq: Every day | ORAL | 0 refills | Status: DC
  Filled 2022-02-06: qty 10, 5d supply, fill #0

## 2022-02-06 MED ORDER — PANTOPRAZOLE SODIUM 40 MG PO TBEC
40.0000 mg | DELAYED_RELEASE_TABLET | Freq: Every day | ORAL | 3 refills | Status: DC
  Filled 2022-02-06: qty 90, 90d supply, fill #0
  Filled 2022-05-07: qty 90, 90d supply, fill #1
  Filled 2022-08-04: qty 90, 90d supply, fill #2

## 2022-02-06 NOTE — Assessment & Plan Note (Signed)
Recent labs improved on crestor 20 mg daily. No labs today.

## 2022-02-06 NOTE — Progress Notes (Signed)
   Subjective:   Patient ID: [name], female    DOB: 01-31-1960, 62 y.o.   MRN: [id number]  HPI The patient is here for physical.  PMH, [facility], social history reviewed and updated  Review of Systems  Constitutional: Negative.   HENT: Negative.    Eyes: Negative.   Respiratory:  Negative for cough, chest tightness and shortness of breath.   Cardiovascular:  Negative for chest pain, palpitations and leg swelling.  Gastrointestinal:  Negative for abdominal distention, abdominal pain, constipation, diarrhea, nausea and vomiting.  Musculoskeletal:  Positive for arthralgias.  Skin: Negative.   Neurological: Negative.   Psychiatric/Behavioral: Negative.      Objective:  Physical Exam Constitutional:      Appearance: She is well-developed.  HENT:     Head: Normocephalic and atraumatic.  Cardiovascular:     Rate and Rhythm: Normal rate and regular rhythm.  Pulmonary:     Effort: Pulmonary effort is normal. No respiratory distress.     Breath sounds: Normal breath sounds. No wheezing or rales.  Abdominal:     General: Bowel sounds are normal. There is no distension.     Palpations: Abdomen is soft.     Tenderness: There is no abdominal tenderness. There is no rebound.  Musculoskeletal:        General: Tenderness present.     Cervical back: Normal range of motion.  Skin:    General: Skin is warm and dry.  Neurological:     Mental Status: She is alert and oriented to person, place, and time.     Coordination: Coordination normal.     Vitals:   02/06/22 0816  BP: 130/82  Pulse: 80  Temp: 98.3 F (36.8 C)  TempSrc: Oral  SpO2: 98%  Weight: 196 lb (88.9 kg)  Height: '5\' 8"'$  (1.727 m)    Assessment & Plan:

## 2022-02-06 NOTE — Assessment & Plan Note (Signed)
Likely bursitis flare. Due to prior BP issues will avoid NSAIDs and do 5 day course of prednisone which is prescribed today.

## 2022-02-06 NOTE — Patient Instructions (Signed)
We have sent in prednisone to take 2 pills daily for 5 days.

## 2022-02-06 NOTE — Assessment & Plan Note (Signed)
Flu shot up to date. Covid-19 counseled. Shingrix complete. Tetanus up to date. Colonoscopy up to date. Mammogram up to date, pap smear up to date. Counseled about sun safety and mole surveillance. Counseled about the dangers of distracted driving. Given 10 year screening recommendations.   

## 2022-02-06 NOTE — Assessment & Plan Note (Signed)
Checking HgA1c. She is down 25 pounds since last congratulated and she will continue with working on lifestyle changes.

## 2022-02-06 NOTE — Assessment & Plan Note (Signed)
Well controlled on protonix 40 mg daily. Refilled and plan to continue.

## 2022-02-06 NOTE — Assessment & Plan Note (Signed)
Checking CMP and CBC. Well controlled on amlodipine 5 mg daily and atenolol 50 mg daily. Adjust as needed.

## 2022-02-10 ENCOUNTER — Other Ambulatory Visit (HOSPITAL_COMMUNITY): Payer: Self-pay

## 2022-02-12 ENCOUNTER — Other Ambulatory Visit (HOSPITAL_COMMUNITY): Payer: Self-pay

## 2022-02-12 MED ORDER — TOLTERODINE TARTRATE ER 4 MG PO CP24
4.0000 mg | ORAL_CAPSULE | Freq: Every day | ORAL | 2 refills | Status: DC
  Filled 2022-02-12: qty 90, 90d supply, fill #0
  Filled 2022-05-07: qty 90, 90d supply, fill #1
  Filled 2022-08-04: qty 90, 90d supply, fill #2

## 2022-02-20 ENCOUNTER — Other Ambulatory Visit (HOSPITAL_COMMUNITY): Payer: Self-pay

## 2022-02-26 DIAGNOSIS — M25511 Pain in right shoulder: Secondary | ICD-10-CM | POA: Diagnosis not present

## 2022-03-08 ENCOUNTER — Ambulatory Visit (INDEPENDENT_AMBULATORY_CARE_PROVIDER_SITE_OTHER)
Admission: RE | Admit: 2022-03-08 | Discharge: 2022-03-08 | Disposition: A | Discharge status: Home / Self Care | Payer: Commercial Managed Care - PPO | Source: Ambulatory Visit | Attending: Internal Medicine | Admitting: Internal Medicine

## 2022-03-08 ENCOUNTER — Other Ambulatory Visit: Payer: Self-pay

## 2022-03-08 DIAGNOSIS — M79641 Pain in right hand: Secondary | ICD-10-CM

## 2022-03-08 DIAGNOSIS — M7989 Other specified soft tissue disorders: Secondary | ICD-10-CM | POA: Diagnosis not present

## 2022-03-27 DIAGNOSIS — M25511 Pain in right shoulder: Secondary | ICD-10-CM | POA: Diagnosis not present

## 2022-03-27 DIAGNOSIS — M67911 Unspecified disorder of synovium and tendon, right shoulder: Secondary | ICD-10-CM | POA: Diagnosis not present

## 2022-04-04 ENCOUNTER — Other Ambulatory Visit (HOSPITAL_COMMUNITY): Payer: Self-pay

## 2022-04-10 DIAGNOSIS — H9201 Otalgia, right ear: Secondary | ICD-10-CM | POA: Diagnosis not present

## 2022-04-12 DIAGNOSIS — Z1231 Encounter for screening mammogram for malignant neoplasm of breast: Secondary | ICD-10-CM | POA: Diagnosis not present

## 2022-04-26 DIAGNOSIS — M67911 Unspecified disorder of synovium and tendon, right shoulder: Secondary | ICD-10-CM | POA: Diagnosis not present

## 2022-05-28 ENCOUNTER — Other Ambulatory Visit: Payer: Self-pay | Admitting: Orthopedic Surgery

## 2022-05-28 DIAGNOSIS — M25511 Pain in right shoulder: Secondary | ICD-10-CM

## 2022-05-29 ENCOUNTER — Encounter: Payer: Self-pay | Admitting: Internal Medicine

## 2022-06-01 ENCOUNTER — Other Ambulatory Visit (HOSPITAL_COMMUNITY): Payer: Self-pay

## 2022-06-01 ENCOUNTER — Encounter: Payer: Self-pay | Admitting: Internal Medicine

## 2022-06-06 ENCOUNTER — Other Ambulatory Visit (HOSPITAL_COMMUNITY): Payer: Self-pay

## 2022-06-06 MED ORDER — DIAZEPAM 10 MG PO TABS
10.0000 mg | ORAL_TABLET | ORAL | 0 refills | Status: DC
  Filled 2022-06-06: qty 2, 1d supply, fill #0

## 2022-06-08 NOTE — Telephone Encounter (Signed)
Please see if she was on pravastatin in past

## 2022-06-09 ENCOUNTER — Ambulatory Visit
Admission: RE | Admit: 2022-06-09 | Discharge: 2022-06-09 | Disposition: A | Discharge status: Home / Self Care | Payer: Commercial Managed Care - PPO | Source: Ambulatory Visit | Attending: Orthopedic Surgery | Admitting: Orthopedic Surgery

## 2022-06-09 DIAGNOSIS — M67813 Other specified disorders of tendon, right shoulder: Secondary | ICD-10-CM | POA: Diagnosis not present

## 2022-06-09 DIAGNOSIS — M25511 Pain in right shoulder: Secondary | ICD-10-CM

## 2022-06-20 ENCOUNTER — Other Ambulatory Visit: Payer: Self-pay

## 2022-06-20 DIAGNOSIS — E785 Hyperlipidemia, unspecified: Secondary | ICD-10-CM

## 2022-06-20 DIAGNOSIS — Z79899 Other long term (current) drug therapy: Secondary | ICD-10-CM

## 2022-06-20 NOTE — Telephone Encounter (Signed)
I would recomm checking a lipomed in November

## 2022-06-25 DIAGNOSIS — Z130 Encounter for screening for diseases of the blood and blood-forming organs and certain disorders involving the immune mechanism: Secondary | ICD-10-CM | POA: Diagnosis not present

## 2022-06-25 DIAGNOSIS — Z01419 Encounter for gynecological examination (general) (routine) without abnormal findings: Secondary | ICD-10-CM | POA: Diagnosis not present

## 2022-06-25 DIAGNOSIS — Z1389 Encounter for screening for other disorder: Secondary | ICD-10-CM | POA: Diagnosis not present

## 2022-06-26 DIAGNOSIS — M7521 Bicipital tendinitis, right shoulder: Secondary | ICD-10-CM | POA: Diagnosis not present

## 2022-06-26 DIAGNOSIS — M75111 Incomplete rotator cuff tear or rupture of right shoulder, not specified as traumatic: Secondary | ICD-10-CM | POA: Diagnosis not present

## 2022-06-26 DIAGNOSIS — M19011 Primary osteoarthritis, right shoulder: Secondary | ICD-10-CM | POA: Diagnosis not present

## 2022-06-27 ENCOUNTER — Other Ambulatory Visit: Payer: Self-pay | Admitting: Internal Medicine

## 2022-06-28 ENCOUNTER — Other Ambulatory Visit (HOSPITAL_COMMUNITY): Payer: Self-pay

## 2022-06-29 ENCOUNTER — Other Ambulatory Visit (HOSPITAL_COMMUNITY): Payer: Self-pay

## 2022-06-29 MED ORDER — AMLODIPINE BESYLATE 5 MG PO TABS
ORAL_TABLET | ORAL | 0 refills | Status: DC
  Filled 2022-06-29: qty 30, 30d supply, fill #0

## 2022-06-29 MED ORDER — ATENOLOL 50 MG PO TABS
50.0000 mg | ORAL_TABLET | Freq: Every day | ORAL | 0 refills | Status: DC
  Filled 2022-06-29: qty 30, 30d supply, fill #0

## 2022-07-02 ENCOUNTER — Other Ambulatory Visit: Payer: Self-pay | Admitting: Internal Medicine

## 2022-07-02 ENCOUNTER — Other Ambulatory Visit (HOSPITAL_COMMUNITY): Payer: Self-pay

## 2022-07-04 ENCOUNTER — Other Ambulatory Visit (HOSPITAL_COMMUNITY): Payer: Self-pay

## 2022-07-04 ENCOUNTER — Other Ambulatory Visit: Payer: Self-pay | Admitting: Internal Medicine

## 2022-07-04 MED ORDER — FLUTICASONE PROPIONATE 50 MCG/ACT NA SUSP
2.0000 | Freq: Every day | NASAL | 3 refills | Status: DC
  Filled 2022-07-04: qty 48, 90d supply, fill #0

## 2022-07-05 ENCOUNTER — Other Ambulatory Visit (HOSPITAL_COMMUNITY): Payer: Self-pay

## 2022-07-06 DIAGNOSIS — H5203 Hypermetropia, bilateral: Secondary | ICD-10-CM | POA: Diagnosis not present

## 2022-07-06 DIAGNOSIS — H31002 Unspecified chorioretinal scars, left eye: Secondary | ICD-10-CM | POA: Diagnosis not present

## 2022-07-11 NOTE — Telephone Encounter (Signed)
I spoke with the pt and she is having rotator cuff surgery and needed a sooner appt than with the APP end of August... I move her to see Dr Tenny Craw 08/08/22.

## 2022-07-16 ENCOUNTER — Emergency Department (HOSPITAL_COMMUNITY)
Admission: EM | Admit: 2022-07-16 | Discharge: 2022-07-17 | Disposition: A | Discharge status: Home / Self Care | Payer: Commercial Managed Care - PPO | Attending: Emergency Medicine | Admitting: Emergency Medicine

## 2022-07-16 ENCOUNTER — Emergency Department (HOSPITAL_COMMUNITY): Payer: Commercial Managed Care - PPO

## 2022-07-16 ENCOUNTER — Encounter: Payer: Self-pay | Admitting: Internal Medicine

## 2022-07-16 ENCOUNTER — Telehealth: Payer: Self-pay

## 2022-07-16 ENCOUNTER — Telehealth: Payer: Self-pay | Admitting: Internal Medicine

## 2022-07-16 DIAGNOSIS — R000 Tachycardia, unspecified: Secondary | ICD-10-CM | POA: Diagnosis not present

## 2022-07-16 DIAGNOSIS — R42 Dizziness and giddiness: Secondary | ICD-10-CM | POA: Diagnosis not present

## 2022-07-16 DIAGNOSIS — Z7982 Long term (current) use of aspirin: Secondary | ICD-10-CM | POA: Insufficient documentation

## 2022-07-16 DIAGNOSIS — R079 Chest pain, unspecified: Secondary | ICD-10-CM | POA: Diagnosis not present

## 2022-07-16 DIAGNOSIS — I10 Essential (primary) hypertension: Secondary | ICD-10-CM | POA: Insufficient documentation

## 2022-07-16 DIAGNOSIS — Z79899 Other long term (current) drug therapy: Secondary | ICD-10-CM | POA: Insufficient documentation

## 2022-07-16 DIAGNOSIS — R519 Headache, unspecified: Secondary | ICD-10-CM | POA: Diagnosis not present

## 2022-07-16 LAB — CBC WITH DIFFERENTIAL/PLATELET
Abs Immature Granulocytes: 0.03 10*3/uL (ref 0.00–0.07)
Basophils Absolute: 0 10*3/uL (ref 0.0–0.1)
Basophils Relative: 1 %
Eosinophils Absolute: 0 10*3/uL (ref 0.0–0.5)
Eosinophils Relative: 0 %
HCT: 41.4 % (ref 36.0–46.0)
Hemoglobin: 13.6 g/dL (ref 12.0–15.0)
Immature Granulocytes: 1 %
Lymphocytes Relative: 13 %
Lymphs Abs: 0.9 10*3/uL (ref 0.7–4.0)
MCH: 25.2 pg — ABNORMAL LOW (ref 26.0–34.0)
MCHC: 32.9 g/dL (ref 30.0–36.0)
MCV: 76.8 fL — ABNORMAL LOW (ref 80.0–100.0)
Monocytes Absolute: 0.6 10*3/uL (ref 0.1–1.0)
Monocytes Relative: 9 %
Neutro Abs: 5 10*3/uL (ref 1.7–7.7)
Neutrophils Relative %: 76 %
Platelets: 345 10*3/uL (ref 150–400)
RBC: 5.39 MIL/uL — ABNORMAL HIGH (ref 3.87–5.11)
RDW: 13.9 % (ref 11.5–15.5)
WBC: 6.5 10*3/uL (ref 4.0–10.5)
nRBC: 0 % (ref 0.0–0.2)

## 2022-07-16 LAB — COMPREHENSIVE METABOLIC PANEL
ALT: 36 U/L (ref 0–44)
AST: 24 U/L (ref 15–41)
Albumin: 4.1 g/dL (ref 3.5–5.0)
Alkaline Phosphatase: 71 U/L (ref 38–126)
Anion gap: 15 (ref 5–15)
BUN: 18 mg/dL (ref 8–23)
CO2: 22 mmol/L (ref 22–32)
Calcium: 9.5 mg/dL (ref 8.9–10.3)
Chloride: 100 mmol/L (ref 98–111)
Creatinine, Ser: 0.81 mg/dL (ref 0.44–1.00)
GFR, Estimated: >60 mL/min (ref >60)
Glucose, Bld: 122 mg/dL — ABNORMAL HIGH (ref 70–99)
Potassium: 4 mmol/L (ref 3.5–5.1)
Sodium: 137 mmol/L (ref 135–145)
Total Bilirubin: 0.7 mg/dL (ref 0.3–1.2)
Total Protein: 7 g/dL (ref 6.5–8.1)

## 2022-07-16 LAB — URINALYSIS, W/ REFLEX TO CULTURE (INFECTION SUSPECTED)
Bilirubin Urine: NEGATIVE
Glucose, UA: NEGATIVE mg/dL
Hgb urine dipstick: NEGATIVE
Ketones, ur: NEGATIVE mg/dL
Leukocytes,Ua: NEGATIVE
Nitrite: NEGATIVE
Protein, ur: NEGATIVE mg/dL
Specific Gravity, Urine: 1.009 (ref 1.005–1.030)
pH: 7 (ref 5.0–8.0)

## 2022-07-16 LAB — D-DIMER, QUANTITATIVE: D-Dimer, Quant: 0.43 ug/mL-FEU (ref 0.00–0.50)

## 2022-07-16 LAB — PROTIME-INR
INR: 1 (ref 0.8–1.2)
Prothrombin Time: 13.5 seconds (ref 11.4–15.2)

## 2022-07-16 LAB — TROPONIN I (HIGH SENSITIVITY)
Troponin I (High Sensitivity): 5 ng/L (ref <18)
Troponin I (High Sensitivity): 5 ng/L (ref <18)

## 2022-07-16 MED ORDER — SODIUM CHLORIDE 0.9 % IV BOLUS
1000.0000 mL | Freq: Once | INTRAVENOUS | Status: AC
  Administered 2022-07-16: 1000 mL via INTRAVENOUS

## 2022-07-16 MED ORDER — ACETAMINOPHEN 500 MG PO TABS
1000.0000 mg | ORAL_TABLET | Freq: Once | ORAL | Status: AC
  Administered 2022-07-16: 1000 mg via ORAL
  Filled 2022-07-16: qty 2

## 2022-07-16 NOTE — Discharge Instructions (Addendum)
We evaluated you for your episode of lightheadedness.  Your testing in the emergency department was reassuring including normal cardiac enzymes, normal CT scan of your head, and normal chest x-ray.  We also obtained a blood test to look for blood clots which was negative.  I am not fully sure what caused your lightheadedness, but since your testing was reassuring I think it is safe to go home.  We have placed a referral for cardiology for expedited follow-up.  If you develop any new or worsening symptoms such as fainting, severe pain, difficulty breathing, nausea or vomiting, numbness or tingling, weakness, or any other new symptoms, please return to the emergency department for reassessment.

## 2022-07-16 NOTE — Telephone Encounter (Signed)
Spoke with pt who reports after eating lunch felt heart racing.  Had coworker check BP elevated 150's HR 127.  Went to [facility] felt as if would pass out.  Had coworker recheck BP HR still elevated HR 127.  Also reports really bad HA to back of head.  Advised pt to go to ED for evaluation.  Pt hesitant reports doesn't want to sit in ED all night.  Advised pt of [facility] stand alone ED may have a shorter wait time.  Advised if still needs OV after ED visit to call back and our office will schedule appointment.  Pt expresses understanding.

## 2022-07-16 NOTE — ED Triage Notes (Signed)
Pt BIB EMS from work. Sudden generalized weakness, HA, felts she may pass out.  EMS VS: initial bp 222/112, final bp 163/85, HR 112, cbg 143
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
## 2022-07-16 NOTE — Telephone Encounter (Signed)
Called pt & left a message to call the office.

## 2022-07-16 NOTE — Telephone Encounter (Signed)
[name] had another spell. Headache, legs like jelly, her BP is 160/84, pulse 127, O2 is 99. Indigestion feeling. Doesn't feel right. A coworker had a mobile EKG device, it showed Tachycardia. Per our manager [name], [facility] I have called [name] [name]'s office. Emily [name] is off all week. [name] is speaking with the triage RN now. She was advised to go to ED. 911 was called as her lips started to feel funny. She is being transported to [facility].

## 2022-07-16 NOTE — Telephone Encounter (Signed)
Patient felt weak in the legs after eating tuna and salmon for lunch.  She was assessed by clinic staff and myself.  No chest pain no nausea or vomiting.  Towards the end of the episode slight pressure in the back of the head.  She felt her heart racing.  O2 sat was 99%.  Blood pressure readings 170/84, 160/88 and 144/90   Pulse readings on the right radial 124 132 120 102 100  Physical exam otherwise the patient looks slightly anxious but not unwell. Heart sounds normal regular rhythm and rate but tachycardic with no murmur Lungs were clear to auscultation respirations not labored or  Leg weakness symptoms and overall feeling better except for slight pressure in the back of her head  Similar to episodes she had last year.  She has been compliant with her blood pressure medicine she had not been rechecking her blood pressures because things had improved.  She has an upcoming appointment with Dr. Tenny Craw July 31.  Advised to restart taking blood pressure and pulse at home.  12:38 PM  Feeling better - pulse 90

## 2022-07-17 ENCOUNTER — Other Ambulatory Visit (HOSPITAL_COMMUNITY): Payer: Self-pay

## 2022-07-17 ENCOUNTER — Encounter: Payer: Self-pay | Admitting: Physician Assistant

## 2022-07-17 ENCOUNTER — Ambulatory Visit (INDEPENDENT_AMBULATORY_CARE_PROVIDER_SITE_OTHER): Payer: Commercial Managed Care - PPO

## 2022-07-17 ENCOUNTER — Ambulatory Visit: Payer: Commercial Managed Care - PPO | Admitting: Physician Assistant

## 2022-07-17 VITALS — BP 134/82 | HR 97 | Ht 68.0 in | Wt 198.0 lb

## 2022-07-17 DIAGNOSIS — I10 Essential (primary) hypertension: Secondary | ICD-10-CM

## 2022-07-17 DIAGNOSIS — R000 Tachycardia, unspecified: Secondary | ICD-10-CM

## 2022-07-17 DIAGNOSIS — E785 Hyperlipidemia, unspecified: Secondary | ICD-10-CM

## 2022-07-17 MED ORDER — ATENOLOL 50 MG PO TABS
75.0000 mg | ORAL_TABLET | Freq: Every day | ORAL | 3 refills | Status: DC
  Filled 2022-07-17: qty 135, 90d supply, fill #0

## 2022-07-17 NOTE — Progress Notes (Signed)
Cardiology Office Note:  .   Date:  07/17/2022  ID:  [name], DOB May 16, 1960, MRN [id number] PCP: Myrlene Broker, MD  [facility] Providers Cardiologist:  None    History of Present Illness: .   [name] is a 62 y.o. female with history of HTN, coronary calcium score 0, Sinus tachycardia, plapitations, morton's neuroma, HLD with mild atherosclerotic plaque of aorta on pravastatin.  Yesterday after eating a healthy lunch she had a headache, elevated BP and HR up 124/m. Went to ED and CT head normal. Labs all stable. EKG ST 101/m.  Patient comes in with her husband. Her heart started racing and she got up to go to the BR and became very dizzy. HR was 132, 124. At 3pm it happened again and got a bad headache and went to ED. She's drinks a lot of water. She usually eats healthy but ate out [place] on Sat. This am BP 142/84 and HR 100 2 hrs after taking her meds. Weights been stable, no recent stress.Needs to have shoulder surgery and scheduled to see Dr. Tenny Craw 08/08/22. On her feet all day long but no exercise outside of this.        ROS:    Studies Reviewed: Marland Kitchen         Prior CV Studies: CT CARDIAC SCORING 05/24/2021  Addendum 05/25/2021  1:11 PM ADDENDUM REPORT: 05/25/2021 13:08  CLINICAL DATA:  Cardiovascular disease risk stratification  CAD screening, intermediate CAD risk, treadmill candidate  EXAM: CT Coronary Calcium Score  TECHNIQUE: A gated, non-contrast computed tomography scan of the heart was performed using 3mm slice thickness. Axial images were analyzed on a dedicated workstation. Calcium scoring of the coronary arteries was performed using the Agatston method.  FINDINGS: Coronary Calcium Score:  Left main: 0  Left anterior descending artery: 0  Left circumflex artery: 0  Right coronary artery: 0  Total: 0  Pericardium: Normal.  Ascending Aorta: Normal caliber. Ascending aorta measures approximately 33mm at the mid ascending  aorta measured in an axial plane.  Non-cardiac: See separate report from [facility].  IMPRESSION: Coronary calcium score of 0.  RECOMMENDATIONS: Coronary artery calcium (CAC) score is a strong predictor of incident coronary heart disease (CHD) and provides predictive information beyond traditional risk factors. CAC scoring is reasonable to use in the decision to withhold, postpone, or initiate statin therapy in intermediate-risk or selected borderline-risk asymptomatic adults (age 24-75 years and LDL-C >=70 to <190 mg/dL) who do not have diabetes or established atherosclerotic cardiovascular disease (ASCVD).* In intermediate-risk (10-year ASCVD risk >=7.5% to <20%) adults or selected borderline-risk (10-year ASCVD risk >=5% to <7.5%) adults in whom a CAC score is measured for the purpose of making a treatment decision the following recommendations have been made:  If CAC=0, it is reasonable to withhold statin therapy and reassess in 5 to 10 years, as long as higher risk conditions are absent (diabetes mellitus, family history of premature CHD in first degree relatives (males <55 years; females <65 years), cigarette smoking, or LDL >=190 mg/dL).  If CAC is 1 to 99, it is reasonable to initiate statin therapy for patients >=61 years of age.  If CAC is >=100 or >=75th percentile, it is reasonable to initiate statin therapy at any age.  Cardiology referral should be considered for patients with CAC scores >=400 or >=75th percentile.  *2018 AHA/ACC/AACVPR/AAPA/ABC/ACPM/ADA/AGS/APhA/ASPC/NLA/PCNA Guideline on the Management of Blood Cholesterol: A Report of the [employer] of Cardiology/American Heart Association Task Force on  Clinical Practice Guidelines. J Am Coll Cardiol. 2019;73(24):3168-3209.   Electronically Signed By: Weston Brass M.D. On: 05/25/2021 13:08  Narrative CLINICAL DATA:  This over-read does not include interpretation of cardiac or  coronary anatomy or pathology. The coronary calcium score interpretation by the cardiologist is attached.  COMPARISON:  Chest CT 04/04/2003.  FINDINGS: Within the visualized portions of the thorax there are no suspicious appearing pulmonary nodules or masses, there is no acute consolidative airspace disease, no pleural effusions, no pneumothorax and no lymphadenopathy. Visualized portions of the upper abdomen are unremarkable. There are no aggressive appearing lytic or blastic lesions noted in the visualized portions of the skeleton.  IMPRESSION: 1. No significant incidental noncardiac findings are noted.  Electronically Signed: By: Trudie Reed M.D. On: 05/24/2021 09:33       Risk Assessment/Calculations:             Physical Exam:   VS:  BP 134/82   Pulse 97   Ht 5\' 8"  (1.727 m)   Wt 198 lb (89.8 kg)   SpO2 98%   BMI 30.11 kg/m    Wt Readings from Last 3 Encounters:  07/17/22 198 lb (89.8 kg)  07/16/22 197 lb (89.4 kg)  02/06/22 196 lb (88.9 kg)    GEN: Well nourished, well developed in no acute distress NECK: No JVD; No carotid bruits CARDIAC:  RRR, no murmurs, rubs, gallops RESPIRATORY:  Clear to auscultation without rales, wheezing or rhonchi  ABDOMEN: Soft, non-tender, non-distended EXTREMITIES:  No edema; No deformity   ASSESSMENT AND PLAN: .     HTN-BP running high. Will increase atenolol 75 mg daily -check echo  Sinus tachycardia at rest. No triggers. Will increase atenolol 75 mg daily-if continues patient can increase to 100 mg daily.  -1 week Zio - keep f/u with Dr. Tenny Craw.  HLD stopped pravastatin due to muscle aches. LDL 74 12/2021. Lipo a normal.       Dispo: f/u as scheduled  Signed, Jacolyn Reedy, PA-C

## 2022-07-17 NOTE — Progress Notes (Unsigned)
ZIO XT serial # [id number] from office inventory applied to patient.  Dr. Tenny Craw to read.

## 2022-07-17 NOTE — Patient Instructions (Addendum)
Medication Instructions:  Your physician has recommended you make the following change in your medication:  INCREASE ATENOLOL TO 75 MG DAILY    *If you need a refill on your cardiac medications before your next appointment, please call your pharmacy*   Lab Work: TODAY: TSH If you have labs (blood work) drawn today and your tests are completely normal, you will receive your results only by: MyChart Message (if you have MyChart) OR A paper copy in the mail If you have any lab test that is abnormal or we need to change your treatment, we will call you to review the results.   Testing/Procedures: Christena Deem- Long Term Monitor Instructions  Your physician has requested you wear a ZIO patch monitor for 7 days  This is a single patch monitor. [employer] supplies one patch monitor per enrollment. Additional stickers are not available. Please do not apply patch if you will be having a Nuclear Stress Test,  Echocardiogram, Cardiac CT, MRI, or Chest Xray during the period you would be wearing the  monitor. The patch cannot be worn during these tests. You cannot remove and re-apply the  ZIO XT patch monitor.  Your ZIO patch monitor will be mailed 3 day USPS to your address on file. It may take 3-5 days  to receive your monitor after you have been enrolled.  Once you have received your monitor, please review the enclosed instructions. Your monitor  has already been registered assigning a specific monitor serial # to you.  Billing and Patient Assistance Program Information  We have supplied [employer] with any of your insurance information on file for billing purposes. [employer] offers a sliding scale Patient Assistance Program for patients that do not have  insurance, or whose insurance does not completely cover the cost of the ZIO monitor.  You must apply for the Patient Assistance Program to qualify for this discounted rate.  To apply, please call [employer] at [phone], select option 4, select option 2,  ask to apply for  Patient Assistance Program. Meredeth Ide will ask your household income, and how many people  are in your household. They will quote your out-of-pocket cost based on that information.  [employer] will also be able to set up a 50-month, interest-free payment plan if needed.  Your physician has requested that you have an echocardiogram. Echocardiography is a painless test that uses sound waves to create images of your heart. It provides your doctor with information about the size and shape of your heart and how well your heart's chambers and valves are working. This procedure takes approximately one hour. There are no restrictions for this procedure. Please do NOT wear cologne, perfume, aftershave, or lotions (deodorant is allowed). Please arrive 15 minutes prior to your appointment time.    Follow-Up: At [facility], you and your health needs are our priority.  As part of our continuing mission to provide you with exceptional heart care, we have created designated Provider Care Teams.  These Care Teams include your primary Cardiologist (physician) and Advanced Practice Providers (APPs -  Physician Assistants and Nurse Practitioners) who all work together to provide you with the care you need, when you need it.  We recommend signing up for the patient portal called "MyChart".  Sign up information is provided on this After Visit Summary.  MyChart is used to connect with patients for Virtual Visits (Telemedicine).  Patients are able to view lab/test results, encounter notes, upcoming appointments, etc.  Non-urgent messages can be sent to your  provider as well.   To learn more about what you can do with MyChart, go to [email or web address].    Your next appointment:   KEEP SCHEDULED FOLLOW-UP

## 2022-07-17 NOTE — Telephone Encounter (Signed)
Please overbook the next time I am in clinic

## 2022-07-18 ENCOUNTER — Telehealth: Payer: Self-pay

## 2022-07-18 ENCOUNTER — Encounter: Payer: Self-pay | Admitting: Internal Medicine

## 2022-07-18 LAB — TSH: TSH: 2.94 u[IU]/mL (ref 0.450–4.500)

## 2022-07-18 NOTE — Telephone Encounter (Signed)
I called pt and moved up her appt to 08/03/22 which is Dr Tenny Craw' next OV date.

## 2022-07-18 NOTE — Transitions of Care (Post Inpatient/ED Visit) (Signed)
07/18/2022  Name: [name] MRN: [id number] DOB: 1960/03/27  Today's TOC FU Call Status: Today's TOC FU Call Status:: Successful TOC FU Call Competed TOC FU Call Complete Date: 07/18/22  Transition Care Management Follow-up Telephone Call Date of Discharge: 07/17/22 Discharge Facility: [facility]) Type of Discharge: Emergency Department Reason for ED Visit: Other: (dizziness) How have you been since you were released from the hospital?: Better Any questions or concerns?: No  Items Reviewed: Did you receive and understand the discharge instructions provided?: Yes Medications obtained,verified, and reconciled?: Yes (Medications Reviewed) Any new allergies since your discharge?: No Dietary orders reviewed?: Yes Do you have support at home?: Yes People in Home: spouse  Medications Reviewed Today: Medications Reviewed Today     Reviewed by Karena Addison, LPN (Licensed Practical Nurse) on 07/18/22 at 1154  Med List Status: <None>   Medication Order Taking? Sig Documenting Provider Last Dose Status Informant  amLODipine (NORVASC) 5 MG tablet [id number]  Take 1/2 tablet by mouth once daily for 3 days then increase to 1 tablet (5 mg) daily thereafter.  Patient taking differently: Take 5 mg by mouth daily.   Pricilla Riffle, MD  Active   aspirin 81 MG tablet [id number] No Take 81 mg by mouth daily. [provider] Taking Active   atenolol (TENORMIN) 50 MG tablet [id number]  Take 1.5 tablets (75 mg total) by mouth daily. Dyann Kief, PA-C  Active   azelastine (ASTELIN) 0.1 % nasal spray [id number] No Place 1 spray into both nostrils 2 (two) times daily Valinda Hoar, NP Taking Active   fexofenadine (ALLEGRA) 60 MG tablet [id number] No Take 1 tablet (60 mg total) by mouth 2 (two) times daily. Valinda Hoar, NP Taking Active   Fluocinolone Acetonide 0.01 % OIL [id number] No Instill 4 drops into right ear twice a day as needed.  Taking Active   Discontinued  07/31/21 0954 (Duplicate)            Med Note (COX, CANDICE A   Wed Sep 13, 2021  2:04 PM)    Discontinued 07/31/21 0954 (Discontinued by provider)   Multiple Vitamins-Minerals (MULTIVITAMIN & MINERAL PO) [id number] No Take 1 tablet by mouth daily. [provider] Taking Active   pantoprazole (PROTONIX) 40 MG tablet [id number]  Take 1 tablet (40 mg total) by mouth daily. Myrlene Broker, MD  Active   tolterodine (DETROL LA) 4 MG 24 hr capsule [id number]  Take 1 capsule (4 mg total) by mouth daily.   Active   TURMERIC PO [id number] No Take 500 mg by mouth 2 (two) times daily. [provider] Taking Active   VITAMIN D PO [id number] No Take 2,000 Units by mouth. [provider] Taking Active Self            Home Care and Equipment/Supplies: Were Home Health Services Ordered?: NA Any new equipment or medical supplies ordered?: NA  Functional Questionnaire: Do you need assistance with bathing/showering or dressing?: No Do you need assistance with meal preparation?: No Do you need assistance with eating?: No Do you have difficulty maintaining continence: No Do you need assistance with getting out of bed/getting out of a chair/moving?: No Do you have difficulty managing or taking your medications?: No  Follow up appointments reviewed: PCP Follow-up appointment confirmed?: NA Specialist Hospital Follow-up appointment confirmed?: Yes Date of Specialist follow-up appointment?: 07/17/22 Follow-Up Specialty Provider:: cardio Do you need transportation to your follow-up appointment?: No Do you understand care options if your  condition(s) worsen?: Yes-patient verbalized understanding    SIGNATURE Karena Addison, LPN [facility] Nurse Health Advisor Direct Dial [phone]

## 2022-07-19 ENCOUNTER — Ambulatory Visit (HOSPITAL_COMMUNITY): Payer: Commercial Managed Care - PPO | Attending: Cardiovascular Disease

## 2022-07-19 DIAGNOSIS — E785 Hyperlipidemia, unspecified: Secondary | ICD-10-CM | POA: Insufficient documentation

## 2022-07-19 DIAGNOSIS — I10 Essential (primary) hypertension: Secondary | ICD-10-CM | POA: Diagnosis not present

## 2022-07-19 DIAGNOSIS — R000 Tachycardia, unspecified: Secondary | ICD-10-CM | POA: Diagnosis not present

## 2022-07-19 LAB — ECHOCARDIOGRAM COMPLETE
Area-P 1/2: 3.28 cm2
S' Lateral: 3.2 cm

## 2022-07-19 MED ORDER — PERFLUTREN LIPID MICROSPHERE
2.0000 mL | INTRAVENOUS | Status: AC | PRN
Start: 2022-07-19 — End: 2022-07-19
  Administered 2022-07-19: 2 mL via INTRAVENOUS

## 2022-07-26 ENCOUNTER — Other Ambulatory Visit (HOSPITAL_COMMUNITY): Payer: Self-pay

## 2022-07-27 DIAGNOSIS — R000 Tachycardia, unspecified: Secondary | ICD-10-CM | POA: Diagnosis not present

## 2022-07-28 ENCOUNTER — Other Ambulatory Visit (HOSPITAL_COMMUNITY): Payer: Self-pay
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
## 2022-08-03 ENCOUNTER — Other Ambulatory Visit (HOSPITAL_COMMUNITY): Payer: Self-pay

## 2022-08-03 ENCOUNTER — Encounter: Payer: Self-pay | Admitting: Internal Medicine

## 2022-08-03 ENCOUNTER — Ambulatory Visit: Payer: Commercial Managed Care - PPO | Attending: Internal Medicine | Admitting: Internal Medicine

## 2022-08-03 VITALS — BP 130/82 | HR 80 | Ht 68.0 in | Wt 194.4 lb

## 2022-08-03 DIAGNOSIS — R000 Tachycardia, unspecified: Secondary | ICD-10-CM

## 2022-08-03 DIAGNOSIS — I10 Essential (primary) hypertension: Secondary | ICD-10-CM | POA: Diagnosis not present

## 2022-08-03 DIAGNOSIS — Z79899 Other long term (current) drug therapy: Secondary | ICD-10-CM

## 2022-08-03 DIAGNOSIS — E785 Hyperlipidemia, unspecified: Secondary | ICD-10-CM

## 2022-08-03 MED ORDER — AMLODIPINE BESYLATE 5 MG PO TABS
5.0000 mg | ORAL_TABLET | Freq: Two times a day (BID) | ORAL | 3 refills | Status: DC
  Filled 2022-08-03 – 2022-08-06 (×2): qty 180, 90d supply, fill #0
  Filled 2022-10-31: qty 180, 90d supply, fill #1
  Filled 2023-01-18: qty 180, 90d supply, fill #2
  Filled 2023-04-28: qty 180, 90d supply, fill #3

## 2022-08-03 MED ORDER — ATENOLOL 50 MG PO TABS
75.0000 mg | ORAL_TABLET | Freq: Every day | ORAL | 3 refills | Status: DC
  Filled 2022-08-03: qty 45, 30d supply, fill #0
  Filled 2022-09-03: qty 45, 30d supply, fill #1
  Filled 2022-09-30: qty 45, 30d supply, fill #2

## 2022-08-03 MED ORDER — AMLODIPINE BESYLATE 5 MG PO TABS
5.0000 mg | ORAL_TABLET | Freq: Every day | ORAL | 3 refills | Status: DC
  Filled 2022-08-03: qty 30, 30d supply, fill #0

## 2022-08-03 NOTE — Patient Instructions (Addendum)
Medication Instructions:  Your physician recommends that you continue on your current medications as directed. Please refer to the Current Medication list given to you today.  Start taking amlodipine 5 mg 2 times daily.  *If you need a refill on your cardiac medications before your next appointment, please call your pharmacy*   Lab Work: AIC, Cort level, ANA, AppB, Lpa, NMR If you have labs (blood work) drawn today and your tests are completely normal, you will receive your results only by: MyChart Message (if you have MyChart) OR A paper copy in the mail If you have any lab test that is abnormal or we need to change your treatment, we will call you to review the results.   Follow-Up: At [facility], you and your health needs are our priority.  As part of our continuing mission to provide you with exceptional heart care, we have created designated Provider Care Teams.  These Care Teams include your primary Cardiologist (physician) and Advanced Practice Providers (APPs -  Physician Assistants and Nurse Practitioners) who all work together to provide you with the care you need, when you need it.   Your next appointment:   Late fall   Provider:   Dr Tenny Craw

## 2022-08-04 ENCOUNTER — Other Ambulatory Visit (HOSPITAL_COMMUNITY): Payer: Self-pay

## 2022-08-04 ENCOUNTER — Other Ambulatory Visit: Payer: Self-pay | Admitting: Internal Medicine

## 2022-08-06 ENCOUNTER — Other Ambulatory Visit (HOSPITAL_COMMUNITY): Payer: Self-pay

## 2022-08-07 ENCOUNTER — Other Ambulatory Visit (HOSPITAL_COMMUNITY): Payer: Self-pay

## 2022-08-08 ENCOUNTER — Other Ambulatory Visit (HOSPITAL_COMMUNITY): Payer: Self-pay

## 2022-08-08 ENCOUNTER — Other Ambulatory Visit: Payer: Self-pay

## 2022-08-08 ENCOUNTER — Encounter: Payer: Self-pay | Admitting: Internal Medicine

## 2022-08-08 ENCOUNTER — Ambulatory Visit: Payer: Commercial Managed Care - PPO | Admitting: Internal Medicine

## 2022-08-08 MED ORDER — FLUTICASONE PROPIONATE 50 MCG/ACT NA SUSP
2.0000 | Freq: Every day | NASAL | 3 refills | Status: DC
  Filled 2022-08-08 – 2023-01-18 (×2): qty 16, 30d supply, fill #0
  Filled 2023-02-18: qty 16, 30d supply, fill #1

## 2022-08-08 NOTE — Addendum Note (Signed)
Addended by: Deatra James on: 08/08/2022 09:31 AM   Modules accepted: Orders

## 2022-08-09 ENCOUNTER — Encounter: Payer: Self-pay | Admitting: Internal Medicine

## 2022-08-13 ENCOUNTER — Other Ambulatory Visit (HOSPITAL_COMMUNITY): Payer: Self-pay

## 2022-08-13 ENCOUNTER — Telehealth: Payer: Self-pay | Admitting: Internal Medicine

## 2022-08-13 NOTE — Telephone Encounter (Signed)
Reviewed test results with pt  Reviewed clinic visit   She has had about 3 spells (HTN, tachycardia) Cortsol normal  Get records from rheumatology (? Dr Dimple Casey)  On differential is pheochromocytoma  Difficult to catch if not having spell She is on b blocker as well    Will get back with her   Diet:   Br:  Premier protein shake; cheerios, plain greek yogurt with blueberries Decaf coffee  Lunch   Tunafish   Catered lunch at office (sometimes 2x per week)  Dinner:  fish, veggies   Recommm   Cut out cheerios; stop eating catered lunches if not good

## 2022-08-14 ENCOUNTER — Telehealth: Payer: Self-pay | Admitting: Internal Medicine

## 2022-08-14 ENCOUNTER — Other Ambulatory Visit (HOSPITAL_COMMUNITY): Payer: Self-pay

## 2022-08-14 DIAGNOSIS — E785 Hyperlipidemia, unspecified: Secondary | ICD-10-CM

## 2022-08-14 MED ORDER — ATORVASTATIN CALCIUM 10 MG PO TABS
10.0000 mg | ORAL_TABLET | Freq: Every day | ORAL | 3 refills | Status: DC
  Filled 2022-08-14: qty 90, 90d supply, fill #0
  Filled 2022-10-31: qty 90, 90d supply, fill #1
  Filled 2023-01-18 – 2023-01-21 (×2): qty 90, 90d supply, fill #2
  Filled 2023-04-10 – 2023-04-17 (×2): qty 90, 90d supply, fill #3

## 2022-08-14 NOTE — Telephone Encounter (Signed)
Discussed lab results with patient.  Per Dr. Ladona Ridgel: ----- Message from Dietrich Pates sent at 08/13/2022 10:38 AM EDT ----- Reviewed with patient  Reviewed diet   Recomm whole, unprocessed foods; mediterranean style  Try lipitor 10 mg   Start every other day   If tolerates (had Cramping on Crestor) then increase to 10 mg daily  Follow up lipomed and apob in 10 wks with liver panel and CK Call lipitor into pharmacy   Atorvastatin 10mg  sent to pharmacy of choice.  Patient is scheduled to see Dr. Tenny Craw on 10/30/22 and has lab scheduled for 11/14/22. Will keep these appts in place.  NMR already ordered and scheduled for 11/14/22. Will order ApoB and add to lab appt for same date.  Patient verbalized understanding and expressed appreciation for call.

## 2022-08-14 NOTE — Telephone Encounter (Signed)
-----   Message from [place] sent at 08/13/2022 10:38 AM EDT ----- Reviewed with patient  Reviewed diet   Recomm whole, unprocessed foods; mediterranean style  Try lipitor 10 mg   Start every other day   If tolerates (had Cramping on Crestor) then increase to 10 mg daily  Follow up lipomed and apob in 10 wks with liver panel and CK Call lipitor into pharmacy

## 2022-08-22 NOTE — Telephone Encounter (Signed)
Records requested from Dr Dimple Casey.

## 2022-08-28 ENCOUNTER — Other Ambulatory Visit (HOSPITAL_COMMUNITY): Payer: Self-pay

## 2022-08-30 ENCOUNTER — Other Ambulatory Visit (HOSPITAL_COMMUNITY): Payer: Self-pay

## 2022-08-30 ENCOUNTER — Telehealth: Payer: Self-pay | Admitting: Internal Medicine

## 2022-08-30 MED ORDER — PANTOPRAZOLE SODIUM 40 MG PO TBEC
40.0000 mg | DELAYED_RELEASE_TABLET | Freq: Two times a day (BID) | ORAL | 3 refills | Status: DC
  Filled 2022-08-30: qty 180, 90d supply, fill #0
  Filled 2022-10-02: qty 60, 30d supply, fill #0
  Filled 2022-10-30: qty 60, 30d supply, fill #1
  Filled 2022-11-24: qty 60, 30d supply, fill #2
  Filled 2022-12-23: qty 60, 30d supply, fill #3
  Filled 2023-01-18: qty 60, 30d supply, fill #4
  Filled 2023-02-21: qty 60, 30d supply, fill #5
  Filled 2023-03-27 – 2023-03-28 (×2): qty 60, 30d supply, fill #6
  Filled 2023-04-28: qty 60, 30d supply, fill #7
  Filled 2023-05-27: qty 60, 30d supply, fill #8
  Filled 2023-06-21: qty 60, 30d supply, fill #9
  Filled 2023-07-14: qty 60, 30d supply, fill #10
  Filled 2023-08-22: qty 60, 30d supply, fill #11

## 2022-08-30 NOTE — Telephone Encounter (Signed)
[name] said she was non 20 mg pantoprazole the other day and still having indigestion  I told her to try 2 x 20 mg  It helps she told me today  I was going to order 40 mg but her list indicates that is what she is on?  Please clarify

## 2022-08-30 NOTE — Telephone Encounter (Signed)
Will try 40 mg bid

## 2022-08-30 NOTE — Telephone Encounter (Signed)
I spoke with Britta Mccreedy and she will double check her bottle when she gets home Sir.

## 2022-08-31 ENCOUNTER — Other Ambulatory Visit (HOSPITAL_COMMUNITY): Payer: Self-pay

## 2022-09-03 ENCOUNTER — Other Ambulatory Visit (HOSPITAL_COMMUNITY): Payer: Self-pay

## 2022-09-04 ENCOUNTER — Ambulatory Visit: Payer: Commercial Managed Care - PPO | Admitting: Physician Assistant

## 2022-09-04 ENCOUNTER — Other Ambulatory Visit (HOSPITAL_COMMUNITY): Payer: Self-pay

## 2022-09-13 ENCOUNTER — Other Ambulatory Visit: Payer: Self-pay | Admitting: Orthopedic Surgery

## 2022-09-13 NOTE — Patient Instructions (Signed)
SURGICAL WAITING ROOM VISITATION Patients having surgery or a procedure may have no more than 2 support people in the waiting area - these visitors may rotate in the visitor waiting room.   Due to an increase in RSV and influenza rates and associated hospitalizations, children ages 23 and under may not visit patients in [facility] hospitals. If the patient needs to stay at the hospital during part of their recovery, the visitor guidelines for inpatient rooms apply.  PRE-OP VISITATION  Pre-op nurse will coordinate an appropriate time for 1 support person to accompany the patient in pre-op.  This support person may not rotate.  This visitor will be contacted when the time is appropriate for the visitor to come back in the pre-op area.  Please refer to the [facility] website for the visitor guidelines for Inpatients (after your surgery is over and you are in a regular room).  You are not required to quarantine at this time prior to your surgery. However, you must do this: Hand Hygiene often Do NOT share personal items Notify your provider if you are in close contact with someone who has COVID or you develop fever 100.4 or greater, new onset of sneezing, cough, sore throat, shortness of breath or body aches.  If you test positive for Covid or have been in contact with anyone that has tested positive in the last 10 days please notify you surgeon.    Your procedure is scheduled on:  Monday  September 17, 2022  Report to [facility] Main Entrance: [place] entrance where the [employer] is available.   Report to admitting at:  12:30  PM  Call this number if you have any questions or problems the morning of surgery [phone]  Do not eat food after Midnight the night prior to your surgery/procedure.  After Midnight you may have the following liquids until  11:45 AM DAY OF SURGERY  Clear Liquid Diet Water Black Coffee (sugar ok, NO MILK/CREAM OR CREAMERS)  Tea (sugar ok, NO  MILK/CREAM OR CREAMERS) regular and decaf                             Plain Jell-O  with no fruit (NO RED)                                           Fruit ices (not with fruit pulp, NO RED)                                     Popsicles (NO RED)                                                                  Juice: NO CITRUS JUICES: only apple, WHITE grape, WHITE cranberry Sports drinks like Gatorade or Powerade (NO RED)                   The day of surgery:  Drink ONE (1) Pre-Surgery  G2 at 11:45 AM the morning of surgery. Drink in one  sitting. Do not sip.  This drink was given to you during your hospital pre-op appointment visit. Nothing else to drink after completing the Pre-Surgery Clear Ensure or G2 : No candy, chewing gum or throat lozenges.    FOLLOW  ANY ADDITIONAL PRE OP INSTRUCTIONS YOU RECEIVED FROM YOUR SURGEON'S OFFICE!!!   Oral Hygiene is also important to reduce your risk of infection.        Remember - BRUSH YOUR TEETH THE MORNING OF SURGERY WITH YOUR REGULAR TOOTHPASTE  Do NOT smoke after Midnight the night before surgery.  STOP TAKING all Vitamins, Herbs and supplements 1 week before your surgery.   Take ONLY these medicines the morning of surgery with A SIP OF WATER: tolterodine (Detrol LA), pantoprazole (Protonix),  Amlodipine, atenolol.  You may use your Flonase Nasal Spray if needed.  You may take Tylenol if needed for pain.    You may not have any metal on your body including hair pins, jewelry, and body piercing  Do not wear make-up, lotions, powders, perfumes or deodorant  Do not wear nail polish including gel and S&S, artificial / acrylic nails, or any other type of covering on natural nails including finger and toenails. If you have artificial nails, gel coating, etc., that needs to be removed by a nail salon, Please have this removed prior to surgery. Not doing so may mean that your surgery could be cancelled or delayed if the Surgeon or anesthesia staff  feels like they are unable to monitor you safely.   Do not shave 48 hours prior to surgery to avoid nicks in your skin which may contribute to postoperative infections.   Contacts, Hearing Aids, dentures or bridgework may not be worn into surgery. DENTURES WILL BE REMOVED PRIOR TO SURGERY PLEASE DO NOT APPLY "Poly grip" OR ADHESIVES!!!   Patients discharged on the day of surgery will not be allowed to drive home.  Someone NEEDS to stay with you for the first 24 hours after anesthesia.  Do not bring your home medications to the hospital. The Pharmacy will dispense medications listed on your medication list to you during your admission in the Hospital.   Please read over the following fact sheets you were given: IF YOU HAVE QUESTIONS ABOUT YOUR PRE-OP INSTRUCTIONS, PLEASE CALL [phone]   [facility] - Preparing for Surgery Before surgery, you can play an important role.  Because skin is not sterile, your skin needs to be as free of germs as possible.  You can reduce the number of germs on your skin by washing with CHG (chlorahexidine gluconate) soap before surgery.  CHG is an antiseptic cleaner which kills germs and bonds with the skin to continue killing germs even after washing. Please DO NOT use if you have an allergy to CHG or antibacterial soaps.  If your skin becomes reddened/irritated stop using the CHG and inform your nurse when you arrive at Short Stay. Do not shave (including legs and underarms) for at least 48 hours prior to the first CHG shower.  You may shave your face/neck.  Please follow these instructions carefully:  1.  Shower with CHG Soap the night before surgery and the  morning of surgery.  2.  If you choose to wash your hair, wash your hair first as usual with your normal  shampoo.  3.  After you shampoo, rinse your hair and body thoroughly to remove the shampoo.  4.  Use CHG as you would any other liquid soap.  You can apply chg directly to  the skin and wash.  Gently with a scrungie or clean washcloth.  5.  Apply the CHG Soap to your body ONLY FROM THE NECK DOWN.   Do not use on face/ open                           Wound or open sores. Avoid contact with eyes, ears mouth and genitals (private parts).                       Wash face,  Genitals (private parts) with your normal soap.             6.  Wash thoroughly, paying special attention to the area where your  surgery  will be performed.  7.  Thoroughly rinse your body with warm water from the neck down.  8.  DO NOT shower/wash with your normal soap after using and rinsing off the CHG Soap.            9.  Pat yourself dry with a clean towel.            10.  Wear clean pajamas.            11.  Place clean sheets on your bed the night of your first shower and do not  sleep with pets.  ON THE DAY OF SURGERY : Do not apply any lotions/deodorants the morning of surgery.  Please wear clean clothes to the hospital/surgery center.     FAILURE TO FOLLOW THESE INSTRUCTIONS MAY RESULT IN THE CANCELLATION OF YOUR SURGERY  PATIENT SIGNATURE_________________________________  NURSE SIGNATURE__________________________________  ________________________________________________________________________

## 2022-09-13 NOTE — Progress Notes (Addendum)
COVID Vaccine received:  []  No [x]  Yes Date of any COVID positive Test in last 90 days: None  PCP - Hillard Danker, MD Cardiologist - Dietrich Pates, MD   [name] 08-03-2022  Rheumatology-  Sheliah Hatch, MD  Chest x-ray - 07-16-2022  1v  Epic EKG -  07-17-2022  Epic Stress Test -  ECHO - 07-19-2022   Epic Cardiac Cath -  Zio Monitor-  07-31-2022   Epic CTA cardiac score- 0  on 05-24-2021  Epic  PCR screen: []  Ordered & Completed           []   No Order but Needs PROFEND           [x]   N/A for this surgery  Surgery Plan:  [x]  Ambulatory   []  Outpatient in bed  []  Admit  Anesthesia:    [x]  General  []  Spinal  []   Choice []   MAC  Pacemaker / ICD device [x]  No []  Yes   Spinal Cord Stimulator:[x]  No []  Yes       History of Sleep Apnea? [x]  No []  Yes   CPAP used?- [x]  No []  Yes    Does the patient monitor blood sugar?  [x]  No []  Yes  []  N/A  Patient has: []  NO Hx DM   [x]  Pre-DM   []  DM1  []   DM2 Last A1c was:  5.9  on  08-03-2022   Pre-DM     Does patient have a [employer] or Dexacom? [x]  No []  Yes   Fasting Blood Sugar Ranges-  Checks Blood Sugar _0  times a day  Blood Thinner / Instructions:  none Aspirin Instructions:   ASA 81 mg  stopped 09-14-2022  ERAS Protocol Ordered: []  No  []  Yes PRE-SURGERY []  ENSURE  []  G2   []  No Drink Ordered  Patient is to be NPO after: 11:45  Comments: Patient works in the office at [employer]  No Surgeon orders available at this PST visit. I did call and spoke to [place] who was going to Text Dr. Luiz Blare to sign off on his orders. I told her the labs and instructions that I had for this patient and she said they were fine.   Activity level: Patient is able to climb a flight of stairs without difficulty; [x]  No CP  [x]  No SOB. Patient can perform ADLs without assistance.   Anesthesia review: HTN, GERD, Pre-DM  (Diet), tachycardia  Patient denies shortness of breath, fever, cough and chest pain at PAT appointment.  Patient verbalized  understanding and agreement to the Pre-Surgical Instructions that were given to them at this PAT appointment. Patient was also educated of the need to review these PAT instructions again prior to her surgery.I reviewed the appropriate phone numbers to call if they have any and questions or concerns.

## 2022-09-14 ENCOUNTER — Encounter (HOSPITAL_COMMUNITY)
Admission: RE | Admit: 2022-09-14 | Discharge: 2022-09-14 | Disposition: A | Discharge status: Home / Self Care | Payer: Commercial Managed Care - PPO | Source: Ambulatory Visit | Attending: Orthopedic Surgery | Admitting: Orthopedic Surgery

## 2022-09-14 ENCOUNTER — Encounter (HOSPITAL_COMMUNITY): Payer: Self-pay

## 2022-09-14 ENCOUNTER — Other Ambulatory Visit: Payer: Self-pay

## 2022-09-14 VITALS — BP 127/86 | HR 88 | Temp 99.0°F | Resp 18 | Ht 68.0 in | Wt 192.0 lb

## 2022-09-14 DIAGNOSIS — R7303 Prediabetes: Secondary | ICD-10-CM

## 2022-09-14 DIAGNOSIS — I10 Essential (primary) hypertension: Secondary | ICD-10-CM

## 2022-09-14 DIAGNOSIS — Z01818 Encounter for other preprocedural examination: Secondary | ICD-10-CM | POA: Diagnosis present

## 2022-09-14 DIAGNOSIS — Z8614 Personal history of Methicillin resistant Staphylococcus aureus infection: Secondary | ICD-10-CM | POA: Diagnosis not present

## 2022-09-14 DIAGNOSIS — Z01812 Encounter for preprocedural laboratory examination: Secondary | ICD-10-CM | POA: Insufficient documentation

## 2022-09-14 HISTORY — DX: Prediabetes: R73.03

## 2022-09-14 HISTORY — DX: Cardiac arrhythmia, unspecified: I49.9

## 2022-09-14 HISTORY — DX: Unspecified osteoarthritis, unspecified site: M19.90

## 2022-09-14 LAB — BASIC METABOLIC PANEL
Anion gap: 9 (ref 5–15)
BUN: 24 mg/dL — ABNORMAL HIGH (ref 8–23)
CO2: 26 mmol/L (ref 22–32)
Calcium: 9.6 mg/dL (ref 8.9–10.3)
Chloride: 102 mmol/L (ref 98–111)
Creatinine, Ser: 0.8 mg/dL (ref 0.44–1.00)
GFR, Estimated: >60 mL/min (ref >60)
Glucose, Bld: 104 mg/dL — ABNORMAL HIGH (ref 70–99)
Potassium: 4.3 mmol/L (ref 3.5–5.1)
Sodium: 137 mmol/L (ref 135–145)

## 2022-09-14 LAB — CBC
HCT: 41.1 % (ref 36.0–46.0)
Hemoglobin: 13.5 g/dL (ref 12.0–15.0)
MCH: 25.4 pg — ABNORMAL LOW (ref 26.0–34.0)
MCHC: 32.8 g/dL (ref 30.0–36.0)
MCV: 77.4 fL — ABNORMAL LOW (ref 80.0–100.0)
Platelets: 357 10*3/uL (ref 150–400)
RBC: 5.31 MIL/uL — ABNORMAL HIGH (ref 3.87–5.11)
RDW: 13.7 % (ref 11.5–15.5)
WBC: 4.7 10*3/uL (ref 4.0–10.5)
nRBC: 0 % (ref 0.0–0.2)

## 2022-09-14 LAB — SURGICAL PCR SCREEN
MRSA, PCR: NEGATIVE
Staphylococcus aureus: NEGATIVE
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
## 2022-09-16 NOTE — Progress Notes (Signed)
Get lipomed, BMET when I see her again

## 2022-09-17 ENCOUNTER — Encounter (HOSPITAL_COMMUNITY): Payer: Self-pay | Admitting: Orthopedic Surgery

## 2022-09-17 ENCOUNTER — Encounter (HOSPITAL_COMMUNITY)
Admission: RE | Disposition: A | Discharge status: Home / Self Care | Payer: Self-pay | Source: Home / Self Care | Attending: Orthopedic Surgery

## 2022-09-17 ENCOUNTER — Other Ambulatory Visit: Payer: Self-pay

## 2022-09-17 ENCOUNTER — Other Ambulatory Visit (HOSPITAL_COMMUNITY): Payer: Self-pay

## 2022-09-17 ENCOUNTER — Ambulatory Visit (HOSPITAL_COMMUNITY): Payer: Commercial Managed Care - PPO | Admitting: Anesthesiology

## 2022-09-17 ENCOUNTER — Ambulatory Visit (HOSPITAL_COMMUNITY)
Admission: RE | Admit: 2022-09-17 | Discharge: 2022-09-17 | Disposition: A | Discharge status: Home / Self Care | Payer: Commercial Managed Care - PPO | Attending: Orthopedic Surgery | Admitting: Orthopedic Surgery

## 2022-09-17 DIAGNOSIS — S46111A Strain of muscle, fascia and tendon of long head of biceps, right arm, initial encounter: Secondary | ICD-10-CM | POA: Diagnosis present

## 2022-09-17 DIAGNOSIS — M75101 Unspecified rotator cuff tear or rupture of right shoulder, not specified as traumatic: Secondary | ICD-10-CM | POA: Diagnosis not present

## 2022-09-17 DIAGNOSIS — M75121 Complete rotator cuff tear or rupture of right shoulder, not specified as traumatic: Secondary | ICD-10-CM

## 2022-09-17 DIAGNOSIS — Z79899 Other long term (current) drug therapy: Secondary | ICD-10-CM | POA: Insufficient documentation

## 2022-09-17 DIAGNOSIS — M19011 Primary osteoarthritis, right shoulder: Secondary | ICD-10-CM | POA: Diagnosis not present

## 2022-09-17 DIAGNOSIS — M7541 Impingement syndrome of right shoulder: Secondary | ICD-10-CM | POA: Diagnosis not present

## 2022-09-17 DIAGNOSIS — M7521 Bicipital tendinitis, right shoulder: Secondary | ICD-10-CM | POA: Diagnosis not present

## 2022-09-17 DIAGNOSIS — I10 Essential (primary) hypertension: Secondary | ICD-10-CM | POA: Diagnosis not present

## 2022-09-17 DIAGNOSIS — S46011A Strain of muscle(s) and tendon(s) of the rotator cuff of right shoulder, initial encounter: Secondary | ICD-10-CM | POA: Diagnosis not present

## 2022-09-17 DIAGNOSIS — G8918 Other acute postprocedural pain: Secondary | ICD-10-CM | POA: Diagnosis not present

## 2022-09-17 DIAGNOSIS — R7303 Prediabetes: Secondary | ICD-10-CM

## 2022-09-17 DIAGNOSIS — Z8614 Personal history of Methicillin resistant Staphylococcus aureus infection: Secondary | ICD-10-CM

## 2022-09-17 HISTORY — PX: SHOULDER ARTHROSCOPY WITH DISTAL CLAVICLE RESECTION: SHX5675

## 2022-09-17 SURGERY — SHOULDER ARTHROSCOPY WITH DISTAL CLAVICLE RESECTION
Anesthesia: General | Site: Shoulder | Laterality: Right

## 2022-09-17 MED ORDER — DEXAMETHASONE SODIUM PHOSPHATE 10 MG/ML IJ SOLN
INTRAMUSCULAR | Status: DC | PRN
Start: 2022-09-17 — End: 2022-09-17
  Administered 2022-09-17: 8 mg via INTRAVENOUS

## 2022-09-17 MED ORDER — SUGAMMADEX SODIUM 200 MG/2ML IV SOLN
INTRAVENOUS | Status: DC | PRN
  Administered 2022-09-17: 200 mg via INTRAVENOUS

## 2022-09-17 MED ORDER — ROCURONIUM BROMIDE 10 MG/ML (PF) SYRINGE
PREFILLED_SYRINGE | INTRAVENOUS | Status: AC
  Filled 2022-09-17: qty 10

## 2022-09-17 MED ORDER — ROCURONIUM BROMIDE 100 MG/10ML IV SOLN
INTRAVENOUS | Status: DC | PRN
  Administered 2022-09-17: 60 mg via INTRAVENOUS

## 2022-09-17 MED ORDER — ONDANSETRON HCL 4 MG/2ML IJ SOLN
INTRAMUSCULAR | Status: DC | PRN
  Administered 2022-09-17: 4 mg via INTRAVENOUS

## 2022-09-17 MED ORDER — LACTATED RINGERS IV SOLN: INTRAVENOUS | Status: DC

## 2022-09-17 MED ORDER — PHENYLEPHRINE HCL-NACL 20-0.9 MG/250ML-% IV SOLN
INTRAVENOUS | Status: DC | PRN
Start: 2022-09-17 — End: 2022-09-17
  Administered 2022-09-17: 40 ug/min via INTRAVENOUS

## 2022-09-17 MED ORDER — ONDANSETRON HCL 4 MG/2ML IJ SOLN
INTRAMUSCULAR | Status: AC
  Filled 2022-09-17: qty 2

## 2022-09-17 MED ORDER — KETOROLAC TROMETHAMINE 30 MG/ML IJ SOLN
INTRAMUSCULAR | Status: AC
  Administered 2022-09-17: 30 mg via INTRAVENOUS
  Filled 2022-09-17: qty 1

## 2022-09-17 MED ORDER — MIDAZOLAM HCL 2 MG/2ML IJ SOLN
2.0000 mg | Freq: Once | INTRAMUSCULAR | Status: AC
  Administered 2022-09-17: 1 mg via INTRAVENOUS
  Filled 2022-09-17: qty 2

## 2022-09-17 MED ORDER — OXYCODONE-ACETAMINOPHEN 5-325 MG PO TABS
1.0000 | ORAL_TABLET | Freq: Four times a day (QID) | ORAL | 0 refills | Status: DC | PRN
Start: 2022-09-17 — End: 2023-01-28
  Filled 2022-09-17: qty 30, 4d supply, fill #0

## 2022-09-17 MED ORDER — OXYCODONE HCL 5 MG PO TABS
ORAL_TABLET | ORAL | Status: AC
  Filled 2022-09-17: qty 1

## 2022-09-17 MED ORDER — PHENYLEPHRINE HCL (PRESSORS) 10 MG/ML IV SOLN
INTRAVENOUS | Status: AC
  Filled 2022-09-17: qty 1

## 2022-09-17 MED ORDER — FENTANYL CITRATE PF 50 MCG/ML IJ SOSY
25.0000 ug | PREFILLED_SYRINGE | INTRAMUSCULAR | Status: DC | PRN
  Administered 2022-09-17: 50 ug via INTRAVENOUS

## 2022-09-17 MED ORDER — DEXAMETHASONE SODIUM PHOSPHATE 10 MG/ML IJ SOLN
INTRAMUSCULAR | Status: AC
  Filled 2022-09-17: qty 1

## 2022-09-17 MED ORDER — BUPIVACAINE HCL (PF) 0.5 % IJ SOLN
INTRAMUSCULAR | Status: DC | PRN
Start: 2022-09-17 — End: 2022-09-17
  Administered 2022-09-17: 15 mL via PERINEURAL

## 2022-09-17 MED ORDER — OXYCODONE HCL 5 MG/5ML PO SOLN: 5.0000 mg | Freq: Once | ORAL | Status: AC | PRN

## 2022-09-17 MED ORDER — BUPIVACAINE LIPOSOME 1.3 % IJ SUSP
INTRAMUSCULAR | Status: DC | PRN
Start: 2022-09-17 — End: 2022-09-17
  Administered 2022-09-17: 10 mL via PERINEURAL

## 2022-09-17 MED ORDER — OXYCODONE HCL 5 MG PO TABS
5.0000 mg | ORAL_TABLET | Freq: Once | ORAL | Status: AC | PRN
  Administered 2022-09-17: 5 mg via ORAL

## 2022-09-17 MED ORDER — EPINEPHRINE PF 1 MG/ML IJ SOLN
INTRAMUSCULAR | Status: AC
  Filled 2022-09-17: qty 1

## 2022-09-17 MED ORDER — FENTANYL CITRATE PF 50 MCG/ML IJ SOSY
100.0000 ug | PREFILLED_SYRINGE | Freq: Once | INTRAMUSCULAR | Status: AC
  Administered 2022-09-17: 50 ug via INTRAVENOUS
  Filled 2022-09-17: qty 2

## 2022-09-17 MED ORDER — CHLORHEXIDINE GLUCONATE 0.12 % MT SOLN
15.0000 mL | Freq: Once | OROMUCOSAL | Status: AC
  Administered 2022-09-17: 15 mL via OROMUCOSAL

## 2022-09-17 MED ORDER — ORAL CARE MOUTH RINSE: 15.0000 mL | Freq: Once | OROMUCOSAL | Status: AC

## 2022-09-17 MED ORDER — EPINEPHRINE PF 1 MG/ML IJ SOLN
INTRAMUSCULAR | Status: DC | PRN
Start: 2022-09-17 — End: 2022-09-18
  Administered 2022-09-17: 1 mL

## 2022-09-17 MED ORDER — PHENYLEPHRINE 80 MCG/ML (10ML) SYRINGE FOR IV PUSH (FOR BLOOD PRESSURE SUPPORT)
PREFILLED_SYRINGE | INTRAVENOUS | Status: DC | PRN
Start: 2022-09-17 — End: 2022-09-17
  Administered 2022-09-17: 80 ug via INTRAVENOUS

## 2022-09-17 MED ORDER — CEFAZOLIN SODIUM-DEXTROSE 2-4 GM/100ML-% IV SOLN
2.0000 g | INTRAVENOUS | Status: AC
  Administered 2022-09-17: 2 g via INTRAVENOUS
  Filled 2022-09-17: qty 100

## 2022-09-17 MED ORDER — PROPOFOL 10 MG/ML IV BOLUS
INTRAVENOUS | Status: DC | PRN
  Administered 2022-09-17: 150 mg via INTRAVENOUS

## 2022-09-17 MED ORDER — LIDOCAINE HCL (CARDIAC) PF 100 MG/5ML IV SOSY
PREFILLED_SYRINGE | INTRAVENOUS | Status: DC | PRN
  Administered 2022-09-17: 100 mg via INTRAVENOUS

## 2022-09-17 MED ORDER — LIDOCAINE HCL (PF) 2 % IJ SOLN
INTRAMUSCULAR | Status: AC
  Filled 2022-09-17: qty 5

## 2022-09-17 MED ORDER — ONDANSETRON HCL 4 MG/2ML IJ SOLN: 4.0000 mg | Freq: Once | INTRAMUSCULAR | Status: DC | PRN

## 2022-09-17 MED ORDER — SODIUM CHLORIDE 0.9 % IR SOLN
Status: DC | PRN
Start: 2022-09-17 — End: 2022-09-18
  Administered 2022-09-17: 12000 mL

## 2022-09-17 MED ORDER — FENTANYL CITRATE PF 50 MCG/ML IJ SOSY
PREFILLED_SYRINGE | INTRAMUSCULAR | Status: AC
  Administered 2022-09-17: 50 ug via INTRAVENOUS
  Filled 2022-09-17: qty 2

## 2022-09-17 MED ORDER — PROPOFOL 10 MG/ML IV BOLUS
INTRAVENOUS | Status: AC
  Filled 2022-09-17: qty 20

## 2022-09-17 MED ORDER — KETOROLAC TROMETHAMINE 30 MG/ML IJ SOLN: 30.0000 mg | Freq: Once | INTRAMUSCULAR | Status: AC | PRN

## 2022-09-17 MED ORDER — TIZANIDINE HCL 2 MG PO TABS
2.0000 mg | ORAL_TABLET | Freq: Three times a day (TID) | ORAL | 0 refills | Status: DC | PRN
  Filled 2022-09-17: qty 30, 10d supply, fill #0

## 2022-09-17 SURGICAL SUPPLY — 82 items
AID PSTN UNV HD RSTRNT DISP (MISCELLANEOUS) ×1
ANCH SUT 2 SWLK 19.1 CLS EYLT (Anchor) IMPLANT
ANCH SUT FBRTAPE 1.3X2.6X1.7 (Anchor) ×1 IMPLANT
ANCH SUT FBRTK 1.3 2 TPE (Anchor) ×1 IMPLANT
ANCH SUT SWLK 19.1X4.75 VT (Anchor) IMPLANT
ANCHOR FBRTK 2.6 SUTURETAP 1.3 (Anchor) IMPLANT
ANCHOR SUT FBRTK 2.6 SOFT 1.7 (Anchor) IMPLANT
ANCHOR SWIVELOCK SP KL 4.75 (Anchor) IMPLANT
APL SKNCLS STERI-STRIP NONHPOA (GAUZE/BANDAGES/DRESSINGS)
BAG COUNTER SPONGE SURGICOUNT (BAG) IMPLANT
BAG SPNG CNTER NS LX DISP (BAG)
BENZOIN TINCTURE PRP APPL 2/3 (GAUZE/BANDAGES/DRESSINGS) IMPLANT
BLADE EXCALIBUR 4.0X13 (MISCELLANEOUS) ×2 IMPLANT
BLADE SURG 15 STRL LF DISP TIS (BLADE) IMPLANT
BLADE SURG 15 STRL SS (BLADE) ×1
BLADE VORTEX 6.0 (BLADE) ×2 IMPLANT
BOOTIES KNEE HIGH SLOAN (MISCELLANEOUS) ×4 IMPLANT
BURR OVAL 8 FLU 5.0X13 (MISCELLANEOUS) IMPLANT
DRAPE FOOT SWITCH (DRAPES) IMPLANT
DRAPE IMP U-DRAPE 54X76 (DRAPES) ×4 IMPLANT
DRAPE INCISE IOBAN 66X45 STRL (DRAPES) ×2 IMPLANT
DRAPE ORTHO SPLIT 77X108 STRL (DRAPES) ×2
DRAPE STERI 35X30 U-POUCH (DRAPES) ×2 IMPLANT
DRAPE SURG 17X23 STRL (DRAPES) ×2 IMPLANT
DRAPE SURG ORHT 6 SPLT 77X108 (DRAPES) ×4 IMPLANT
DRAPE TOP 10253 STERILE (DRAPES) IMPLANT
DRAPE U-SHAPE 47X51 STRL (DRAPES) ×2 IMPLANT
DRSG EMULSION OIL 3X3 NADH (GAUZE/BANDAGES/DRESSINGS) ×2 IMPLANT
DURAPREP 26ML APPLICATOR (WOUND CARE) ×2 IMPLANT
DW OUTFLOW CASSETTE/TUBE SET (MISCELLANEOUS) ×2 IMPLANT
ELECT REM PT RETURN 15FT ADLT (MISCELLANEOUS) ×2 IMPLANT
GAUZE PAD ABD 8X10 STRL (GAUZE/BANDAGES/DRESSINGS) ×4 IMPLANT
GAUZE SPONGE 4X4 12PLY STRL (GAUZE/BANDAGES/DRESSINGS) ×2 IMPLANT
GLOVE BIOGEL PI IND STRL 8 (GLOVE) ×4 IMPLANT
GLOVE ECLIPSE 7.5 STRL STRAW (GLOVE) ×4 IMPLANT
GOWN STRL REUS W/ TWL XL LVL3 (GOWN DISPOSABLE) ×4 IMPLANT
GOWN STRL REUS W/TWL XL LVL3 (GOWN DISPOSABLE) ×2
KIT BASIN OR (CUSTOM PROCEDURE TRAY) ×2 IMPLANT
KIT TURNOVER KIT A (KITS) IMPLANT
MANIFOLD NEPTUNE II (INSTRUMENTS) ×2 IMPLANT
NDL 1/2 CIR CATGUT .05X1.09 (NEEDLE) IMPLANT
NDL HD SCORPION MEGA LOADER (NEEDLE) IMPLANT
NDL SAFETY ECLIP 18X1.5 (MISCELLANEOUS) ×2 IMPLANT
NDL SCORPION MULTI FIRE (NEEDLE) IMPLANT
NDL SUT 6 .5 CRC .975X.05 MAYO (NEEDLE) IMPLANT
NEEDLE 1/2 CIR CATGUT .05X1.09 (NEEDLE) IMPLANT
NEEDLE MAYO TAPER (NEEDLE)
NEEDLE SCORPION MULTI FIRE (NEEDLE) IMPLANT
NS IRRIG 1000ML POUR BTL (IV SOLUTION) IMPLANT
PACK ARTHROSCOPY WL (CUSTOM PROCEDURE TRAY) ×2 IMPLANT
PAD ARMBOARD 7.5X6 YLW CONV (MISCELLANEOUS) ×2 IMPLANT
PASSER SUT SWANSON 36MM LOOP (INSTRUMENTS) IMPLANT
PENCIL SMOKE EVACUATOR (MISCELLANEOUS) IMPLANT
PORT APPOLLO RF 90DEGREE MULTI (SURGICAL WAND) ×2 IMPLANT
RESTRAINT HEAD UNIVERSAL NS (MISCELLANEOUS) ×2 IMPLANT
SET IRRIG Y TYPE TUR BLADDER L (SET/KITS/TRAYS/PACK) ×2 IMPLANT
SLING ARM FOAM STRAP LRG (SOFTGOODS) IMPLANT
SLING ARM FOAM STRAP MED (SOFTGOODS) IMPLANT
SLING ARM FOAM STRAP XLG (SOFTGOODS) IMPLANT
SLING ARM IMMOBILIZER LRG (SOFTGOODS) IMPLANT
SPIKE FLUID TRANSFER (MISCELLANEOUS) IMPLANT
SPONGE T-LAP 4X18 ~~LOC~~+RFID (SPONGE) IMPLANT
SUCTION TUBE FRAZIER 10FR DISP (SUCTIONS) IMPLANT
SUT ETHILON 4 0 PS 2 18 (SUTURE) IMPLANT
SUT MNCRL AB 3-0 PS2 18 (SUTURE) IMPLANT
SUT VIC AB 0 CT1 27 (SUTURE) ×1
SUT VIC AB 0 CT1 27XBRD ANBCTR (SUTURE) IMPLANT
SUT VIC AB 0 CT1 27XBRD ANTBC (SUTURE) IMPLANT
SUT VIC AB 1 CT1 27 (SUTURE)
SUT VIC AB 1 CT1 27XBRD ANBCTR (SUTURE) IMPLANT
SUT VIC AB 1 CT1 36 (SUTURE) IMPLANT
SUT VIC AB 2-0 CT1 27 (SUTURE)
SUT VIC AB 2-0 CT1 TAPERPNT 27 (SUTURE) IMPLANT
SUT VIC AB 2-0 SH 27 (SUTURE)
SUT VIC AB 2-0 SH 27XBRD (SUTURE) IMPLANT
SYR 3ML LL SCALE MARK (SYRINGE) ×2 IMPLANT
TAPE FIBER 2MM 7IN #2 BLUE (SUTURE) IMPLANT
TOWEL OR 17X26 10 PK STRL BLUE (TOWEL DISPOSABLE) ×4 IMPLANT
TUBING ARTHROSCOPY IRRIG 16FT (MISCELLANEOUS) ×2 IMPLANT
TUBING CONNECTING 10 (TUBING) ×2 IMPLANT
WAND ABLATOR APOLLO I90 (BUR) IMPLANT
YANKAUER SUCT BULB TIP NO VENT (SUCTIONS) IMPLANT

## 2022-09-17 NOTE — Brief Op Note (Signed)
09/17/2022  4:17 PM  PATIENT:  [name]  62 y.o. female  PRE-OPERATIVE DIAGNOSIS:  RIGHT SHOULDER ROTATOR CUFF TEAR  POST-OPERATIVE DIAGNOSIS:  RIGHT SHOULDER ROTATOR CUFF TEAR  PROCEDURE:  Procedure(s): SHOULDER ARTHROSCOPY WITH SUBACROMIAL DECOMPRESSION, DISTAL CLAVICLE RESECTION, ROTATOR CUFF REPAIR (Right)  SURGEON:  Surgeons and Role:    Jodi Geralds, MD - Primary  PHYSICIAN ASSISTANT:   ASSISTANTS: jim bethune   ANESTHESIA:   general  EBL:  minimal  BLOOD ADMINISTERED:none  DRAINS: none   LOCAL MEDICATIONS USED:  MARCAINE     SPECIMEN:  No Specimen  DISPOSITION OF SPECIMEN:  N/A  COUNTS:  YES  TOURNIQUET:  * No tourniquets in log *  DICTATION: .Other Dictation: Dictation Number [id number]  PLAN OF CARE: Discharge to home after PACU  PATIENT DISPOSITION:  PACU - hemodynamically stable.   Delay start of Pharmacological VTE agent (>24hrs) due to surgical blood loss or risk of bleeding: no

## 2022-09-17 NOTE — Anesthesia Procedure Notes (Signed)
Procedure Name: Intubation Date/Time: 09/17/2022 2:40 PM  Performed by: Nelle Don, CRNAPre-anesthesia Checklist: Patient identified, Emergency Drugs available, Suction available and Patient being monitored Patient Re-evaluated:Patient Re-evaluated prior to induction Oxygen Delivery Method: Circle system utilized Preoxygenation: Pre-oxygenation with 100% oxygen Induction Type: IV induction Ventilation: Mask ventilation without difficulty Laryngoscope Size: Mac and 4 Grade View: Grade I Tube type: Oral Tube size: 7.5 mm Number of attempts: 1 Airway Equipment and Method: Stylet Placement Confirmation: ETT inserted through vocal cords under direct vision, positive ETCO2 and breath sounds checked- equal and bilateral Secured at: 23 cm Tube secured with: Tape Dental Injury: Teeth and Oropharynx as per pre-operative assessment

## 2022-09-17 NOTE — Anesthesia Procedure Notes (Signed)
Anesthesia Procedure Image    

## 2022-09-17 NOTE — Anesthesia Preprocedure Evaluation (Signed)
Anesthesia Evaluation  Patient identified by MRN, date of birth, ID band Patient awake    Reviewed: Allergy & Precautions, H&P , NPO status , Patient's Chart, lab work & pertinent test results  Airway Mallampati: II  TM Distance: >3 FB Neck ROM: Full    Dental no notable dental hx.    Pulmonary neg pulmonary ROS   Pulmonary exam normal breath sounds clear to auscultation       Cardiovascular hypertension, Pt. on medications Normal cardiovascular exam Rhythm:Regular Rate:Normal     Neuro/Psych negative neurological ROS  negative psych ROS   GI/Hepatic negative GI ROS, Neg liver ROS,,,  Endo/Other  negative endocrine ROS    Renal/GU negative Renal ROS  negative genitourinary   Musculoskeletal  (+) Arthritis , Osteoarthritis,    Abdominal   Peds negative pediatric ROS (+)  Hematology negative hematology ROS (+)   Anesthesia Other Findings   Reproductive/Obstetrics negative OB ROS                             Anesthesia Physical Anesthesia Plan  ASA: 2  Anesthesia Plan: General   Post-op Pain Management: Regional block*   Induction: Intravenous  PONV Risk Score and Plan: 3 and Ondansetron, Dexamethasone and Treatment may vary due to age or medical condition  Airway Management Planned: Oral ETT  Additional Equipment:   Intra-op Plan:   Post-operative Plan: Extubation in OR  Informed Consent: I have reviewed the patients History and Physical, chart, labs and discussed the procedure including the risks, benefits and alternatives for the proposed anesthesia with the patient or authorized representative who has indicated his/her understanding and acceptance.     Dental advisory given  Plan Discussed with: CRNA and Surgeon  Anesthesia Plan Comments:        Anesthesia Quick Evaluation

## 2022-09-17 NOTE — Op Note (Signed)
NAME: [name], KAZMIERSKI MEDICAL RECORD NO: [id number] ACCOUNT NO: [id number] DATE OF BIRTH: [birth year] FACILITY: [facility] LOCATION: [facility] PHYSICIAN: Harvie Junior, MD  Operative Report   DATE OF PROCEDURE: 09/17/2022  PREOPERATIVE DIAGNOSIS:  Impingement, AC joint arthritis, rotator cuff tear and biceps tendinitis.  POSTOPERATIVE DIAGNOSIS:  Impingement, AC joint arthritis, rotator cuff tear and biceps tendinitis.  PROCEDURE:   1.  Arthroscopic subacromial decompression, right. 2.  Arthroscopic distal clavicle, right. 3.  Arthroscopic extensive debridement of biceps tendon, superior labrum, anterior labrum, posterior labrum, humeral head, glenoid, and subacromial bursa. 4.  Mini open rotator cuff repair. 5.  Mini open biceps tenodesis.  SURGEON:  Harvie Junior, MD.  ASSISTANT:  Gus Puma, PA-C who was present entire case and assisted by retraction of the arm, manipulation of tissues and closing to minimize OR time.  BRIEF HISTORY:  The patient is a 62 year old female with a long history significant complaints of right shoulder pain.  She has been treated conservatively for a prolonged period of time.  MRI showed by reading a high-grade partial tear. By my review, it  was a full thickness tear.  She was treated with therapy and activity modification, medication and injection therapy and failed all this.  She was taken to the operating room for right shoulder arthroscopy and fixation as needed.  DESCRIPTION OF PROCEDURE:  The patient was taken to the operating room.  After adequate anesthesia was obtained with a general anesthetic, the patient was placed supine on the operating table. She was then moved into beach chair position.  All bony  prominences well padded.  Attention was then turned to the right shoulder.  After routine prep and drape arthroscopic examination was undertaken, which showed that the patient had unfortunately a fair amount of delamination of the articular cartilage  on  the humeral head really in the area of the central portion of the humeral head.  This was debrided back to a smooth stable rim of articular cartilage with the suction shaver.  She had small areas of cartilage degeneration on the glenoid as well and this  was debrided.  We looked at the biceps tendon which was red, it was inflamed and it did appear to have issues as attachment site to the superior labrum and at this point, we used a suction shaver to debride the attachment of the biceps tendon to the  superior labrum.  Once that was done, the biceps tendon was left free.  Attention was turned towards debridement of the superior labrum, anterior labrum and posterior labrum.  As we went over to check out the rotator cuff, there was clearly a tear or  extremely high-grade partial tear of the supraspinatus tendon.  We then put the suction shaver on it went right through the tendon, which was our participation from a reading of the MRI.  Unfortunately, there was also some degeneration of the humeral  head in this area and we debrided this back to a smooth stable rim of articular cartilage.  Following this, the final check was made in the glenohumeral joint.  Final debridement of the humeral head and final check of the debridement of the labrum.  At  this point, attention was turned out to the glenohumeral joint, went into the subacromial space and an anterolateral acromioplasty of the anterolateral portion of the acromion and then a distal clavicle resection over 20 mm from the anterior compartment.   Following this, we looked at the rotator cuff from the top side  and could see a high-grade partial tear from the top side as well.  At this point, we knew that the rotator cuff was going to be the appropriate course of action.  A small incision was made over the lateral aspect of the shoulder.  Subcutaneous tissue was dissected down to the level of the deltoid.  Deltoid fibers were divided in line and retractor  was put in place.  The bicipital groove was identified, opened and  the biceps tendon was retrieved and tagged.  The high-grade partial tear of the rotator cuff was exploited allowing almost immediate access into the glenohumeral joint, telling us that there was in fact a significant full thickness rotator cuff tear.  I  extended this back to about the mid portion of the supraspinatus that was really the anterior edge of the supraspinatus in the mid portion, which was torn.  We then passed the 4 arms of a medial row through that and brought that to a lateral anchor. As  we looked around anteriorly I was not perfectly happy with the front portion of the repair, so I passed a single FiberTape through that and put it to a lateral PushLock and then used the rescue stitch *** at the central portion.  At this point, a very  nice watertight repair was obtained of the rotator cuff.  Attention was then turned to the bicipital groove where an all suture anchor was placed in the bicipital groove and under minimal tension the biceps tendon was anchored into this area after  verifying the bone in that area.  Once that was accomplished, the wound was irrigated, suctioned dry, and the deltoid was then closed with 0 Vicryl running. The skin was closed with 2-0 Vicryl and 3-0 Monocryl subcuticular.  Benzoin and Steri-Strips were  applied.  Sterile compressive dressing was applied.  The patient was taken to recovery and was noted to be in satisfactory condition.  Estimated blood loss for procedure was minimal.   PUS D: 09/17/2022 4:24:37 pm T: 09/17/2022 8:14:00 pm  JOB: [id number]/ [id number]

## 2022-09-17 NOTE — Anesthesia Procedure Notes (Signed)
Anesthesia Regional Block: Interscalene brachial plexus block   Pre-Anesthetic Checklist: , timeout performed,  Correct Patient, Correct Site, Correct Laterality,  Correct Procedure, Correct Position, site marked,  Risks and benefits discussed,  Surgical consent,  Pre-op evaluation,  At surgeon's request and post-op pain management  Laterality: Right  Prep: chloraprep       Needles:  Injection technique: Single-shot  Needle Type: Echogenic Needle     Needle Length: 9cm      Additional Needles:   Procedures:,,,, ultrasound used (permanent image in chart),,    Narrative:  Start time: 09/17/2022 1:54 PM End time: 09/17/2022 2:04 PM Injection made incrementally with aspirations every 5 mL.  Performed by: Personally  Anesthesiologist: Eilene Ghazi, MD  Additional Notes: Patient tolerated the procedure well without complications

## 2022-09-17 NOTE — Discharge Instructions (Signed)
Discharge Instructions after Arthroscopic Shoulder Repair   A sling has been provided for you. Remain in your sling at all times. This includes sleeping in your sling.  Use ice on the shoulder intermittently over the first 48 hours after surgery.  Pain medicine has been prescribed for you.  Use your medicine liberally over the first 48 hours, and then you can begin to taper your use. You may take Extra Strength Tylenol or Tylenol only in place of the pain pills. DO NOT take ANY nonsteroidal anti-inflammatory pain medications: Advil, Motrin, Ibuprofen, Aleve, Naproxen, or Narprosyn.  You may remove your dressing after two days. If the incision sites are still moist, place a Band-Aid over the moist site(s). Change Band-Aids daily until dry.  You may shower 5 days after surgery. The incisions CANNOT get wet prior to 5 days. Simply allow the water to wash over the site and then pat dry. Do not rub the incisions. Make sure your axilla (armpit) is completely dry after showering.  Take one aspirin a day for 2 weeks after surgery, unless you have an aspirin sensitivity/ allergy or asthma.   Please call [phone] during normal business hours or [phone] after hours for any problems. Including the following:  - excessive redness of the incisions - drainage for more than 4 days - fever of more than 101.5 F  *Please note that pain medications will not be refilled after hours or on weekends.  Discharge Instructions after Arthroscopic Shoulder Repair   A sling has been provided for you. Remain in your sling at all times. This includes sleeping in your sling.  Use ice on the shoulder intermittently over the first 48 hours after surgery.  Pain medicine has been prescribed for you.  Use your medicine liberally over the first 48 hours, and then you can begin to taper your use. You may take Extra Strength Tylenol or Tylenol only in place of the pain pills. DO NOT take ANY nonsteroidal anti-inflammatory  pain medications: Advil, Motrin, Ibuprofen, Aleve, Naproxen, or Narprosyn.  You may remove your dressing after two days. If the incision sites are still moist, place a Band-Aid over the moist site(s). Change Band-Aids daily until dry.  You may shower 5 days after surgery. The incisions CANNOT get wet prior to 5 days. Simply allow the water to wash over the site and then pat dry. Do not rub the incisions. Make sure your axilla (armpit) is completely dry after showering.  Take one aspirin a day for 2 weeks after surgery, unless you have an aspirin sensitivity/ allergy or asthma.   Please call [phone] during normal business hours or [phone] after hours for any problems. Including the following:  - excessive redness of the incisions - drainage for more than 4 days - fever of more than 101.5 F  *Please note that pain medications will not be refilled after hours or on weekends.

## 2022-09-17 NOTE — Transfer of Care (Signed)
Immediate Anesthesia Transfer of Care Note  Patient: [name]  Procedure(s) Performed: SHOULDER ARTHROSCOPY WITH SUBACROMIAL DECOMPRESSION, DISTAL CLAVICLE RESECTION, ROTATOR CUFF REPAIR (Right: Shoulder)  Patient Location: PACU  Anesthesia Type:General  Level of Consciousness: awake, alert , and oriented  Airway & Oxygen Therapy: Patient Spontanous Breathing and Patient connected to face mask oxygen  Post-op Assessment: Report given to RN and Post -op Vital signs reviewed and stable  Post vital signs: Reviewed and stable  Last Vitals:  Vitals Value Taken Time  BP 130/42   Temp    Pulse 68   Resp 12   SpO2 100     Last Pain:  Vitals:   09/17/22 1410  TempSrc:   PainSc: 0-No pain         Complications: No notable events documented.

## 2022-09-18 ENCOUNTER — Encounter (HOSPITAL_COMMUNITY): Payer: Self-pay | Admitting: Orthopedic Surgery

## 2022-09-18 NOTE — Anesthesia Postprocedure Evaluation (Signed)
Anesthesia Post Note  Patient: [name]  Procedure(s) Performed: SHOULDER ARTHROSCOPY WITH SUBACROMIAL DECOMPRESSION, DISTAL CLAVICLE RESECTION, ROTATOR CUFF REPAIR (Right: Shoulder)     Patient location during evaluation: PACU Anesthesia Type: General Level of consciousness: awake and alert Pain management: pain level controlled Vital Signs Assessment: post-procedure vital signs reviewed and stable Respiratory status: spontaneous breathing, nonlabored ventilation, respiratory function stable and patient connected to nasal cannula oxygen Cardiovascular status: blood pressure returned to baseline and stable Postop Assessment: no apparent nausea or vomiting Anesthetic complications: no  No notable events documented.  Last Vitals:  Vitals:   09/17/22 1800 09/17/22 1818  BP: (!) 151/95 (!) 131/98  Pulse: 90 93  Resp: 18   Temp:  36.6 C  SpO2: 94% 97%    Last Pain:  Vitals:   09/17/22 1818  TempSrc:   PainSc: 3                  [name]

## 2022-09-24 DIAGNOSIS — Z9889 Other specified postprocedural states: Secondary | ICD-10-CM | POA: Diagnosis not present

## 2022-09-25 DIAGNOSIS — M25611 Stiffness of right shoulder, not elsewhere classified: Secondary | ICD-10-CM | POA: Diagnosis not present

## 2022-09-25 DIAGNOSIS — M75101 Unspecified rotator cuff tear or rupture of right shoulder, not specified as traumatic: Secondary | ICD-10-CM | POA: Diagnosis not present

## 2022-09-25 DIAGNOSIS — R531 Weakness: Secondary | ICD-10-CM | POA: Diagnosis not present

## 2022-09-27 DIAGNOSIS — M75101 Unspecified rotator cuff tear or rupture of right shoulder, not specified as traumatic: Secondary | ICD-10-CM | POA: Diagnosis not present

## 2022-09-27 DIAGNOSIS — M25611 Stiffness of right shoulder, not elsewhere classified: Secondary | ICD-10-CM | POA: Diagnosis not present

## 2022-09-27 DIAGNOSIS — R531 Weakness: Secondary | ICD-10-CM | POA: Diagnosis not present

## 2022-10-02 ENCOUNTER — Other Ambulatory Visit (HOSPITAL_COMMUNITY): Payer: Self-pay

## 2022-10-02 DIAGNOSIS — M25611 Stiffness of right shoulder, not elsewhere classified: Secondary | ICD-10-CM | POA: Diagnosis not present

## 2022-10-02 DIAGNOSIS — R531 Weakness: Secondary | ICD-10-CM | POA: Diagnosis not present

## 2022-10-02 DIAGNOSIS — M75101 Unspecified rotator cuff tear or rupture of right shoulder, not specified as traumatic: Secondary | ICD-10-CM | POA: Diagnosis not present

## 2022-10-04 ENCOUNTER — Other Ambulatory Visit (HOSPITAL_COMMUNITY): Payer: Self-pay

## 2022-10-04 DIAGNOSIS — M75101 Unspecified rotator cuff tear or rupture of right shoulder, not specified as traumatic: Secondary | ICD-10-CM | POA: Diagnosis not present

## 2022-10-04 DIAGNOSIS — M25611 Stiffness of right shoulder, not elsewhere classified: Secondary | ICD-10-CM | POA: Diagnosis not present

## 2022-10-04 DIAGNOSIS — R531 Weakness: Secondary | ICD-10-CM | POA: Diagnosis not present

## 2022-10-04 MED ORDER — HYDROCODONE-ACETAMINOPHEN 7.5-325 MG PO TABS
1.0000 | ORAL_TABLET | Freq: Three times a day (TID) | ORAL | 0 refills | Status: DC
  Filled 2022-10-04: qty 30, 10d supply, fill #0

## 2022-10-04 MED ORDER — CYCLOBENZAPRINE HCL 10 MG PO TABS
10.0000 mg | ORAL_TABLET | Freq: Three times a day (TID) | ORAL | 0 refills | Status: DC
  Filled 2022-10-04: qty 40, 14d supply, fill #0

## 2022-10-08 DIAGNOSIS — R531 Weakness: Secondary | ICD-10-CM | POA: Diagnosis not present

## 2022-10-08 DIAGNOSIS — M25611 Stiffness of right shoulder, not elsewhere classified: Secondary | ICD-10-CM | POA: Diagnosis not present

## 2022-10-08 DIAGNOSIS — M75101 Unspecified rotator cuff tear or rupture of right shoulder, not specified as traumatic: Secondary | ICD-10-CM | POA: Diagnosis not present

## 2022-10-11 DIAGNOSIS — M25611 Stiffness of right shoulder, not elsewhere classified: Secondary | ICD-10-CM | POA: Diagnosis not present

## 2022-10-11 DIAGNOSIS — R531 Weakness: Secondary | ICD-10-CM | POA: Diagnosis not present

## 2022-10-11 DIAGNOSIS — M75101 Unspecified rotator cuff tear or rupture of right shoulder, not specified as traumatic: Secondary | ICD-10-CM | POA: Diagnosis not present

## 2022-10-16 DIAGNOSIS — M75101 Unspecified rotator cuff tear or rupture of right shoulder, not specified as traumatic: Secondary | ICD-10-CM | POA: Diagnosis not present

## 2022-10-16 DIAGNOSIS — R531 Weakness: Secondary | ICD-10-CM | POA: Diagnosis not present

## 2022-10-16 DIAGNOSIS — M25611 Stiffness of right shoulder, not elsewhere classified: Secondary | ICD-10-CM | POA: Diagnosis not present

## 2022-10-18 DIAGNOSIS — M75101 Unspecified rotator cuff tear or rupture of right shoulder, not specified as traumatic: Secondary | ICD-10-CM | POA: Diagnosis not present

## 2022-10-18 DIAGNOSIS — M25611 Stiffness of right shoulder, not elsewhere classified: Secondary | ICD-10-CM | POA: Diagnosis not present

## 2022-10-18 DIAGNOSIS — R531 Weakness: Secondary | ICD-10-CM | POA: Diagnosis not present

## 2022-10-23 ENCOUNTER — Ambulatory Visit (INDEPENDENT_AMBULATORY_CARE_PROVIDER_SITE_OTHER): Payer: Commercial Managed Care - PPO | Admitting: Radiology

## 2022-10-23 DIAGNOSIS — M25611 Stiffness of right shoulder, not elsewhere classified: Secondary | ICD-10-CM | POA: Diagnosis not present

## 2022-10-23 DIAGNOSIS — M75101 Unspecified rotator cuff tear or rupture of right shoulder, not specified as traumatic: Secondary | ICD-10-CM | POA: Diagnosis not present

## 2022-10-23 DIAGNOSIS — Z23 Encounter for immunization: Secondary | ICD-10-CM

## 2022-10-23 DIAGNOSIS — R531 Weakness: Secondary | ICD-10-CM | POA: Diagnosis not present

## 2022-10-23 NOTE — Progress Notes (Signed)
Patient here for flu shot. Patient tolerated well with no complications.

## 2022-10-25 DIAGNOSIS — R531 Weakness: Secondary | ICD-10-CM | POA: Diagnosis not present

## 2022-10-25 DIAGNOSIS — M75101 Unspecified rotator cuff tear or rupture of right shoulder, not specified as traumatic: Secondary | ICD-10-CM | POA: Diagnosis not present

## 2022-10-25 DIAGNOSIS — M25611 Stiffness of right shoulder, not elsewhere classified: Secondary | ICD-10-CM | POA: Diagnosis not present

## 2022-10-30 ENCOUNTER — Ambulatory Visit: Payer: Commercial Managed Care - PPO | Attending: Internal Medicine | Admitting: Internal Medicine

## 2022-10-30 ENCOUNTER — Encounter: Payer: Self-pay | Admitting: Internal Medicine

## 2022-10-30 VITALS — BP 138/88 | HR 113 | Ht 68.0 in | Wt 193.0 lb

## 2022-10-30 DIAGNOSIS — R531 Weakness: Secondary | ICD-10-CM | POA: Diagnosis not present

## 2022-10-30 DIAGNOSIS — Z79899 Other long term (current) drug therapy: Secondary | ICD-10-CM | POA: Diagnosis not present

## 2022-10-30 DIAGNOSIS — M75101 Unspecified rotator cuff tear or rupture of right shoulder, not specified as traumatic: Secondary | ICD-10-CM | POA: Diagnosis not present

## 2022-10-30 DIAGNOSIS — E785 Hyperlipidemia, unspecified: Secondary | ICD-10-CM | POA: Diagnosis not present

## 2022-10-30 DIAGNOSIS — M25611 Stiffness of right shoulder, not elsewhere classified: Secondary | ICD-10-CM | POA: Diagnosis not present

## 2022-10-30 NOTE — Patient Instructions (Signed)
Medication Instructions:   *If you need a refill on your cardiac medications before your next appointment, please call your pharmacy*   Lab Work: Nmr, apo b, hepatic   If you have labs (blood work) drawn today and your tests are completely normal, you will receive your results only by: MyChart Message (if you have MyChart) OR A paper copy in the mail If you have any lab test that is abnormal or we need to change your treatment, we will call you to review the results.   Testing/Procedures:    Follow-Up: At [facility], you and your health needs are our priority.  As part of our continuing mission to provide you with exceptional heart care, we have created designated Provider Care Teams.  These Care Teams include your primary Cardiologist (physician) and Advanced Practice Providers (APPs -  Physician Assistants and Nurse Practitioners) who all work together to provide you with the care you need, when you need it.  We recommend signing up for the patient portal called "MyChart".  Sign up information is provided on this After Visit Summary.  MyChart is used to connect with patients for Virtual Visits (Telemedicine).  Patients are able to view lab/test results, encounter notes, upcoming appointments, etc.  Non-urgent messages can be sent to your provider as well.   To learn more about what you can do with MyChart, go to [email or web address].    Your next appointment:  May 2025

## 2022-10-31 ENCOUNTER — Other Ambulatory Visit (HOSPITAL_COMMUNITY): Payer: Self-pay

## 2022-10-31 ENCOUNTER — Other Ambulatory Visit: Payer: Self-pay

## 2022-10-31 ENCOUNTER — Encounter: Payer: Self-pay | Admitting: Internal Medicine

## 2022-10-31 MED ORDER — ATENOLOL 100 MG PO TABS
100.0000 mg | ORAL_TABLET | Freq: Every day | ORAL | 3 refills | Status: DC
  Filled 2022-10-31: qty 90, 90d supply, fill #0
  Filled 2023-01-18: qty 90, 90d supply, fill #1
  Filled 2023-04-10: qty 90, 90d supply, fill #2
  Filled 2023-07-14: qty 90, 90d supply, fill #3
  Filled 2023-07-27 – 2023-10-05 (×3): qty 90, 90d supply, fill #4

## 2022-11-01 ENCOUNTER — Other Ambulatory Visit (HOSPITAL_COMMUNITY): Payer: Self-pay

## 2022-11-01 DIAGNOSIS — M25611 Stiffness of right shoulder, not elsewhere classified: Secondary | ICD-10-CM | POA: Diagnosis not present

## 2022-11-01 DIAGNOSIS — M75101 Unspecified rotator cuff tear or rupture of right shoulder, not specified as traumatic: Secondary | ICD-10-CM | POA: Diagnosis not present

## 2022-11-01 DIAGNOSIS — R531 Weakness: Secondary | ICD-10-CM | POA: Diagnosis not present

## 2022-11-01 LAB — HEPATIC FUNCTION PANEL
ALT: 46 [IU]/L — ABNORMAL HIGH (ref 0–32)
AST: 28 IU/L (ref 0–40)
Albumin: 4.4 g/dL (ref 3.9–4.9)
Alkaline Phosphatase: 97 IU/L (ref 44–121)
Bilirubin Total: 0.6 mg/dL (ref 0.0–1.2)
Bilirubin, Direct: 0.15 mg/dL (ref 0.00–0.40)
Total Protein: 7 g/dL (ref 6.0–8.5)

## 2022-11-01 LAB — NMR, LIPOPROFILE
Cholesterol, Total: 172 mg/dL (ref 100–199)
HDL Particle Number: 36.4 umol/L (ref >=30.5)
HDL-C: 68 mg/dL (ref >39)
LDL Particle Number: 1183 nmol/L — ABNORMAL HIGH (ref <1000)
LDL Size: 21.1 nm (ref >20.5)
LDL-C (NIH Calc): 91 mg/dL (ref 0–99)
LP-IR Score: <25 (ref <=45)
Small LDL Particle Number: 388 nmol/L (ref <=527)
Triglycerides: 69 mg/dL (ref 0–149)

## 2022-11-01 LAB — APOLIPOPROTEIN B: Apolipoprotein B: 77 mg/dL (ref <90)

## 2022-11-01 MED ORDER — TOLTERODINE TARTRATE ER 4 MG PO CP24
4.0000 mg | ORAL_CAPSULE | Freq: Every day | ORAL | 2 refills | Status: DC
  Filled 2022-11-01: qty 90, 90d supply, fill #0
  Filled 2023-01-18: qty 90, 90d supply, fill #1
  Filled 2023-04-28: qty 90, 90d supply, fill #2

## 2022-11-02 ENCOUNTER — Other Ambulatory Visit (HOSPITAL_COMMUNITY): Payer: Self-pay

## 2022-11-06 DIAGNOSIS — R531 Weakness: Secondary | ICD-10-CM | POA: Diagnosis not present

## 2022-11-06 DIAGNOSIS — M25611 Stiffness of right shoulder, not elsewhere classified: Secondary | ICD-10-CM | POA: Diagnosis not present

## 2022-11-06 DIAGNOSIS — M75101 Unspecified rotator cuff tear or rupture of right shoulder, not specified as traumatic: Secondary | ICD-10-CM | POA: Diagnosis not present

## 2022-11-08 DIAGNOSIS — M75101 Unspecified rotator cuff tear or rupture of right shoulder, not specified as traumatic: Secondary | ICD-10-CM | POA: Diagnosis not present

## 2022-11-08 DIAGNOSIS — M25611 Stiffness of right shoulder, not elsewhere classified: Secondary | ICD-10-CM | POA: Diagnosis not present

## 2022-11-08 DIAGNOSIS — R531 Weakness: Secondary | ICD-10-CM | POA: Diagnosis not present

## 2022-11-12 DIAGNOSIS — M75101 Unspecified rotator cuff tear or rupture of right shoulder, not specified as traumatic: Secondary | ICD-10-CM | POA: Diagnosis not present

## 2022-11-12 DIAGNOSIS — M25611 Stiffness of right shoulder, not elsewhere classified: Secondary | ICD-10-CM | POA: Diagnosis not present

## 2022-11-12 DIAGNOSIS — R531 Weakness: Secondary | ICD-10-CM | POA: Diagnosis not present

## 2022-11-14 ENCOUNTER — Other Ambulatory Visit: Payer: Commercial Managed Care - PPO

## 2022-11-15 DIAGNOSIS — M25611 Stiffness of right shoulder, not elsewhere classified: Secondary | ICD-10-CM | POA: Diagnosis not present

## 2022-11-15 DIAGNOSIS — M75101 Unspecified rotator cuff tear or rupture of right shoulder, not specified as traumatic: Secondary | ICD-10-CM | POA: Diagnosis not present

## 2022-11-15 DIAGNOSIS — R531 Weakness: Secondary | ICD-10-CM | POA: Diagnosis not present

## 2022-11-20 DIAGNOSIS — M75101 Unspecified rotator cuff tear or rupture of right shoulder, not specified as traumatic: Secondary | ICD-10-CM | POA: Diagnosis not present

## 2022-11-20 DIAGNOSIS — M25611 Stiffness of right shoulder, not elsewhere classified: Secondary | ICD-10-CM | POA: Diagnosis not present

## 2022-11-20 DIAGNOSIS — R531 Weakness: Secondary | ICD-10-CM | POA: Diagnosis not present

## 2022-11-22 DIAGNOSIS — R531 Weakness: Secondary | ICD-10-CM | POA: Diagnosis not present

## 2022-11-22 DIAGNOSIS — M75101 Unspecified rotator cuff tear or rupture of right shoulder, not specified as traumatic: Secondary | ICD-10-CM | POA: Diagnosis not present

## 2022-11-22 DIAGNOSIS — M25611 Stiffness of right shoulder, not elsewhere classified: Secondary | ICD-10-CM | POA: Diagnosis not present

## 2022-11-26 DIAGNOSIS — R531 Weakness: Secondary | ICD-10-CM | POA: Diagnosis not present

## 2022-11-26 DIAGNOSIS — M25611 Stiffness of right shoulder, not elsewhere classified: Secondary | ICD-10-CM | POA: Diagnosis not present

## 2022-11-26 DIAGNOSIS — M75101 Unspecified rotator cuff tear or rupture of right shoulder, not specified as traumatic: Secondary | ICD-10-CM | POA: Diagnosis not present

## 2022-11-28 ENCOUNTER — Other Ambulatory Visit (HOSPITAL_COMMUNITY): Payer: Self-pay

## 2022-11-28 DIAGNOSIS — M25611 Stiffness of right shoulder, not elsewhere classified: Secondary | ICD-10-CM | POA: Diagnosis not present

## 2022-11-28 DIAGNOSIS — R531 Weakness: Secondary | ICD-10-CM | POA: Diagnosis not present

## 2022-11-28 DIAGNOSIS — M75101 Unspecified rotator cuff tear or rupture of right shoulder, not specified as traumatic: Secondary | ICD-10-CM | POA: Diagnosis not present

## 2022-12-04 DIAGNOSIS — R531 Weakness: Secondary | ICD-10-CM | POA: Diagnosis not present

## 2022-12-04 DIAGNOSIS — M25611 Stiffness of right shoulder, not elsewhere classified: Secondary | ICD-10-CM | POA: Diagnosis not present

## 2022-12-04 DIAGNOSIS — M75101 Unspecified rotator cuff tear or rupture of right shoulder, not specified as traumatic: Secondary | ICD-10-CM | POA: Diagnosis not present

## 2022-12-11 ENCOUNTER — Other Ambulatory Visit (HOSPITAL_COMMUNITY): Payer: Self-pay

## 2022-12-11 DIAGNOSIS — R531 Weakness: Secondary | ICD-10-CM | POA: Diagnosis not present

## 2022-12-11 DIAGNOSIS — M25611 Stiffness of right shoulder, not elsewhere classified: Secondary | ICD-10-CM | POA: Diagnosis not present

## 2022-12-11 DIAGNOSIS — M75101 Unspecified rotator cuff tear or rupture of right shoulder, not specified as traumatic: Secondary | ICD-10-CM | POA: Diagnosis not present

## 2022-12-11 MED ORDER — MELOXICAM 15 MG PO TABS
15.0000 mg | ORAL_TABLET | Freq: Every day | ORAL | 1 refills | Status: DC
  Filled 2022-12-11: qty 30, 30d supply, fill #0

## 2022-12-25 ENCOUNTER — Other Ambulatory Visit (HOSPITAL_COMMUNITY): Payer: Self-pay

## 2023-01-18 ENCOUNTER — Other Ambulatory Visit (HOSPITAL_COMMUNITY): Payer: Self-pay

## 2023-01-18 ENCOUNTER — Other Ambulatory Visit: Payer: Self-pay

## 2023-01-21 ENCOUNTER — Other Ambulatory Visit (HOSPITAL_COMMUNITY): Payer: Self-pay

## 2023-01-28 ENCOUNTER — Other Ambulatory Visit (HOSPITAL_COMMUNITY): Payer: Self-pay

## 2023-01-28 ENCOUNTER — Other Ambulatory Visit: Payer: Self-pay | Admitting: Orthopedic Surgery

## 2023-01-28 DIAGNOSIS — S82461A Displaced segmental fracture of shaft of right fibula, initial encounter for closed fracture: Secondary | ICD-10-CM | POA: Diagnosis not present

## 2023-01-28 DIAGNOSIS — S63501A Unspecified sprain of right wrist, initial encounter: Secondary | ICD-10-CM | POA: Diagnosis not present

## 2023-01-28 MED ORDER — HYDROCODONE-ACETAMINOPHEN 7.5-325 MG PO TABS
1.0000 | ORAL_TABLET | Freq: Three times a day (TID) | ORAL | 0 refills | Status: DC | PRN
  Filled 2023-01-28: qty 45, 15d supply, fill #0

## 2023-01-28 NOTE — Patient Instructions (Addendum)
SURGICAL WAITING ROOM VISITATION Patients having surgery or a procedure may have no more than 2 support people in the waiting area - these visitors may rotate.    Children under the age of 37 must have an adult with them who is not the patient.  Due to an increase in RSV and influenza rates and associated hospitalizations, children ages 93 and under may not visit patients in [facility] hospitals.   If the patient needs to stay at the hospital during part of their recovery, the visitor guidelines for inpatient rooms apply. Pre-op nurse will coordinate an appropriate time for 1 support person to accompany patient in pre-op.  This support person may not rotate.    Please refer to the [facility] website for the visitor guidelines for Inpatients (after your surgery is over and you are in a regular room).       Your procedure is scheduled on: 01-30-23   Report to [facility] Main Entrance    Report to admitting at 12:45 PM   Call this number if you have problems the morning of surgery [phone]   Do not eat food :After Midnight.   After Midnight you may have the following liquids until 12:00 PM DAY OF SURGERY  Water Non-Citrus Juices (without pulp, NO RED-Apple, White grape, White cranberry) Black Coffee (NO MILK/CREAM OR CREAMERS, sugar ok)  Clear Tea (NO MILK/CREAM OR CREAMERS, sugar ok) regular and decaf                             Plain Jell-O (NO RED)                                           Fruit ices (not with fruit pulp, NO RED)                                     Popsicles (NO RED)                                                               Sports drinks like Gatorade (NO RED)                   The day of surgery:  Drink ONE (1) Pre-Surgery Clear Ensure or G2 by 12:00 PM the morning of surgery. Drink in one sitting. Do not sip.  This drink was given to you during your hospital  pre-op appointment visit. Nothing else to drink after completing the  Pre-Surgery Clear Ensure or G2.          If you have questions, please contact your surgeon's office.   FOLLOW BOWEL PREP AND ANY ADDITIONAL PRE OP INSTRUCTIONS YOU RECEIVED FROM YOUR SURGEON'S OFFICE!!!     Oral Hygiene is also important to reduce your risk of infection.                                    Remember - BRUSH YOUR TEETH THE MORNING OF  SURGERY WITH YOUR REGULAR TOOTHPASTE   Do NOT smoke after Midnight   Take these medicines the morning of surgery with A SIP OF WATER:   Amlodipine  Atenolol  Atorvastatin  Pantoprazole  Tolterodine  Flonase nasal spray  Hydrocodone if needed  Stop all vitamins and herbal supplements 7 days before surgery  Bring CPAP mask and tubing day of surgery.                              You may not have any metal on your body including hair pins, jewelry, and body piercing             Do not wear make-up, lotions, powders, perfumes, or deodorant  Do not wear nail polish including gel and S&S, artificial/acrylic nails, or any other type of covering on natural nails including finger and toenails. If you have artificial nails, gel coating, etc. that needs to be removed by a nail salon please have this removed prior to surgery or surgery may need to be canceled/ delayed if the surgeon/ anesthesia feels like they are unable to be safely monitored.   Do not shave  48 hours prior to surgery.           Do not bring valuables to the hospital. [facility] IS NOT RESPONSIBLE   FOR VALUABLES.   Contacts, dentures or bridgework may not be worn into surgery.  DO NOT BRING YOUR HOME MEDICATIONS TO THE HOSPITAL. PHARMACY WILL DISPENSE MEDICATIONS LISTED ON YOUR MEDICATION LIST TO YOU DURING YOUR ADMISSION IN THE HOSPITAL!    Patients discharged on the day of surgery will not be allowed to drive home.  Someone NEEDS to stay with you for the first 24 hours after anesthesia.   Special Instructions: Bring a copy of your healthcare power of attorney and living  will documents the day of surgery if you haven't scanned them before.              Please read over the following fact sheets you were given: IF YOU HAVE QUESTIONS ABOUT YOUR PRE-OP INSTRUCTIONS PLEASE CALL [phone] [facility]  If you received a COVID test during your pre-op visit  it is requested that you wear a mask when out in public, stay away from anyone that may not be feeling well and notify your surgeon if you develop symptoms. If you test positive for Covid or have been in contact with anyone that has tested positive in the last 10 days please notify you surgeon.  [facility] - Preparing for Surgery Before surgery, you can play an important role.  Because skin is not sterile, your skin needs to be as free of germs as possible.  You can reduce the number of germs on your skin by washing with CHG (chlorahexidine gluconate) soap before surgery.  CHG is an antiseptic cleaner which kills germs and bonds with the skin to continue killing germs even after washing. Please DO NOT use if you have an allergy to CHG or antibacterial soaps.  If your skin becomes reddened/irritated stop using the CHG and inform your nurse when you arrive at Short Stay. Do not shave (including legs and underarms) for at least 48 hours prior to the first CHG shower.  You may shave your face/neck.  Please follow these instructions carefully:  1.  Shower with CHG Soap the night before surgery and the  morning of surgery.  2.  If you choose to  wash your hair, wash your hair first as usual with your normal  shampoo.  3.  After you shampoo, rinse your hair and body thoroughly to remove the shampoo.                             4.  Use CHG as you would any other liquid soap.  You can apply chg directly to the skin and wash.  Gently with a scrungie or clean washcloth.  5.  Apply the CHG Soap to your body ONLY FROM THE NECK DOWN.   Do   not use on face/ open                           Wound or open sores. Avoid contact with eyes, ears  mouth and   genitals (private parts).                       Wash face,  Genitals (private parts) with your normal soap.             6.  Wash thoroughly, paying special attention to the area where your    surgery  will be performed.  7.  Thoroughly rinse your body with warm water from the neck down.  8.  DO NOT shower/wash with your normal soap after using and rinsing off the CHG Soap.                9.  Pat yourself dry with a clean towel.            10.  Wear clean pajamas.            11.  Place clean sheets on your bed the night of your first shower and do not  sleep with pets. Day of Surgery : Do not apply any lotions/deodorants the morning of surgery.  Please wear clean clothes to the hospital/surgery center.  FAILURE TO FOLLOW THESE INSTRUCTIONS MAY RESULT IN THE CANCELLATION OF YOUR SURGERY  PATIENT SIGNATURE_________________________________  NURSE SIGNATURE__________________________________  ________________________________________________________________________

## 2023-01-28 NOTE — Progress Notes (Signed)
COVID Vaccine Completed:  Yes  Date of COVID positive in last 90 days:  PCP - Hillard Danker, MD Cardiologist - Dietrich Pates,  MD  Chest x-ray - 07-16-22 Epic EKG - 07-17-22 Epic Stress Test -  ECHO - 07-19-22 Epic Cardiac Cath -  Pacemaker/ICD device last checked: Spinal Cord Stimulator: Long Term Monitor - 07-30-22 Epic Cardiac CT 05-24-21 Epic  Bowel Prep -   Sleep Study -  CPAP -   Fasting Blood Sugar -  Checks Blood Sugar _____ times a day  Last dose of GLP1 agonist-  N/A GLP1 instructions:  Hold 7 days before surgery    Last dose of SGLT-2 inhibitors-  N/A SGLT-2 instructions:  Hold 3 days before surgery    Blood Thinner Instructions:  Time Aspirin Instructions: Last Dose:  Activity level:  Can go up a flight of stairs and perform activities of daily living without stopping and without symptoms of chest pain or shortness of breath.  Able to exercise without symptoms  Unable to go up a flight of stairs without symptoms of     Anesthesia review:  Tachycardia followed by cardiology, atherosclerosis, HTN  Patient denies shortness of breath, fever, cough and chest pain at PAT appointment  Patient verbalized understanding of instructions that were given to them at the PAT appointment. Patient was also instructed that they will need to review over the PAT instructions again at home before surgery.

## 2023-01-29 ENCOUNTER — Encounter (HOSPITAL_COMMUNITY)
Admission: RE | Admit: 2023-01-29 | Discharge: 2023-01-29 | Disposition: A | Discharge status: Home / Self Care | Payer: Commercial Managed Care - PPO | Source: Ambulatory Visit | Attending: Orthopedic Surgery | Admitting: Orthopedic Surgery

## 2023-01-29 ENCOUNTER — Other Ambulatory Visit: Payer: Self-pay

## 2023-01-29 ENCOUNTER — Encounter (HOSPITAL_COMMUNITY): Payer: Self-pay

## 2023-01-29 VITALS — BP 131/72 | HR 108 | Temp 99.0°F | Resp 16 | Ht 68.5 in | Wt 194.0 lb

## 2023-01-29 DIAGNOSIS — I10 Essential (primary) hypertension: Secondary | ICD-10-CM | POA: Insufficient documentation

## 2023-01-29 DIAGNOSIS — Z01812 Encounter for preprocedural laboratory examination: Secondary | ICD-10-CM | POA: Insufficient documentation

## 2023-01-29 DIAGNOSIS — Z01818 Encounter for other preprocedural examination: Secondary | ICD-10-CM

## 2023-01-29 LAB — CBC
HCT: 38.3 % (ref 36.0–46.0)
Hemoglobin: 12.4 g/dL (ref 12.0–15.0)
MCH: 24.8 pg — ABNORMAL LOW (ref 26.0–34.0)
MCHC: 32.4 g/dL (ref 30.0–36.0)
MCV: 76.8 fL — ABNORMAL LOW (ref 80.0–100.0)
Platelets: 334 10*3/uL (ref 150–400)
RBC: 4.99 MIL/uL (ref 3.87–5.11)
RDW: 14.4 % (ref 11.5–15.5)
WBC: 10.2 10*3/uL (ref 4.0–10.5)
nRBC: 0 % (ref 0.0–0.2)

## 2023-01-29 LAB — BASIC METABOLIC PANEL
Anion gap: 8 (ref 5–15)
BUN: 20 mg/dL (ref 8–23)
CO2: 24 mmol/L (ref 22–32)
Calcium: 9.3 mg/dL (ref 8.9–10.3)
Chloride: 106 mmol/L (ref 98–111)
Creatinine, Ser: 0.87 mg/dL (ref 0.44–1.00)
GFR, Estimated: >60 mL/min (ref >60)
Glucose, Bld: 137 mg/dL — ABNORMAL HIGH (ref 70–99)
Potassium: 3.5 mmol/L (ref 3.5–5.1)
Sodium: 138 mmol/L (ref 135–145)

## 2023-01-29 LAB — SURGICAL PCR SCREEN
MRSA, PCR: NEGATIVE
Staphylococcus aureus: NEGATIVE

## 2023-01-30 ENCOUNTER — Other Ambulatory Visit (HOSPITAL_COMMUNITY): Payer: Self-pay

## 2023-01-30 ENCOUNTER — Ambulatory Visit (HOSPITAL_COMMUNITY): Payer: Self-pay | Admitting: Physician Assistant

## 2023-01-30 ENCOUNTER — Encounter (HOSPITAL_COMMUNITY)
Admission: RE | Disposition: A | Discharge status: Home / Self Care | Payer: Self-pay | Source: Home / Self Care | Attending: Orthopedic Surgery

## 2023-01-30 ENCOUNTER — Other Ambulatory Visit: Payer: Self-pay

## 2023-01-30 ENCOUNTER — Ambulatory Visit (HOSPITAL_COMMUNITY)
Admission: RE | Admit: 2023-01-30 | Discharge: 2023-01-30 | Disposition: A | Discharge status: Home / Self Care | Payer: Commercial Managed Care - PPO | Attending: Orthopedic Surgery | Admitting: Orthopedic Surgery

## 2023-01-30 ENCOUNTER — Ambulatory Visit (HOSPITAL_COMMUNITY): Payer: Commercial Managed Care - PPO | Admitting: Anesthesiology

## 2023-01-30 ENCOUNTER — Encounter (HOSPITAL_COMMUNITY): Payer: Self-pay | Admitting: Orthopedic Surgery

## 2023-01-30 DIAGNOSIS — Z79899 Other long term (current) drug therapy: Secondary | ICD-10-CM | POA: Insufficient documentation

## 2023-01-30 DIAGNOSIS — S8261XA Displaced fracture of lateral malleolus of right fibula, initial encounter for closed fracture: Secondary | ICD-10-CM

## 2023-01-30 DIAGNOSIS — W010XXA Fall on same level from slipping, tripping and stumbling without subsequent striking against object, initial encounter: Secondary | ICD-10-CM | POA: Insufficient documentation

## 2023-01-30 DIAGNOSIS — I10 Essential (primary) hypertension: Secondary | ICD-10-CM | POA: Diagnosis not present

## 2023-01-30 DIAGNOSIS — G8918 Other acute postprocedural pain: Secondary | ICD-10-CM | POA: Diagnosis not present

## 2023-01-30 DIAGNOSIS — K219 Gastro-esophageal reflux disease without esophagitis: Secondary | ICD-10-CM | POA: Diagnosis not present

## 2023-01-30 DIAGNOSIS — E785 Hyperlipidemia, unspecified: Secondary | ICD-10-CM | POA: Diagnosis not present

## 2023-01-30 DIAGNOSIS — F419 Anxiety disorder, unspecified: Secondary | ICD-10-CM | POA: Diagnosis not present

## 2023-01-30 DIAGNOSIS — Z01818 Encounter for other preprocedural examination: Secondary | ICD-10-CM

## 2023-01-30 HISTORY — PX: ORIF ANKLE FRACTURE: SHX5408

## 2023-01-30 SURGERY — OPEN REDUCTION INTERNAL FIXATION (ORIF) ANKLE FRACTURE
Anesthesia: General | Site: Ankle | Laterality: Right

## 2023-01-30 MED ORDER — CEFAZOLIN SODIUM-DEXTROSE 2-4 GM/100ML-% IV SOLN
2.0000 g | INTRAVENOUS | Status: AC
  Administered 2023-01-30: 2 g via INTRAVENOUS
  Filled 2023-01-30: qty 100

## 2023-01-30 MED ORDER — OXYCODONE HCL 5 MG/5ML PO SOLN: 5.0000 mg | Freq: Once | ORAL | Status: DC | PRN

## 2023-01-30 MED ORDER — PHENYLEPHRINE 80 MCG/ML (10ML) SYRINGE FOR IV PUSH (FOR BLOOD PRESSURE SUPPORT)
PREFILLED_SYRINGE | INTRAVENOUS | Status: DC | PRN
  Administered 2023-01-30: 240 ug via INTRAVENOUS
  Administered 2023-01-30 (×2): 160 ug via INTRAVENOUS

## 2023-01-30 MED ORDER — PROPOFOL 10 MG/ML IV BOLUS
INTRAVENOUS | Status: AC
  Filled 2023-01-30: qty 20

## 2023-01-30 MED ORDER — CHLORHEXIDINE GLUCONATE 0.12 % MT SOLN
15.0000 mL | Freq: Once | OROMUCOSAL | Status: AC
  Administered 2023-01-30: 15 mL via OROMUCOSAL

## 2023-01-30 MED ORDER — ONDANSETRON HCL 4 MG/2ML IJ SOLN
INTRAMUSCULAR | Status: DC | PRN
  Administered 2023-01-30: 4 mg via INTRAVENOUS

## 2023-01-30 MED ORDER — FENTANYL CITRATE (PF) 100 MCG/2ML IJ SOLN
INTRAMUSCULAR | Status: DC | PRN
  Administered 2023-01-30 (×2): 50 ug via INTRAVENOUS

## 2023-01-30 MED ORDER — DROPERIDOL 2.5 MG/ML IJ SOLN: 0.6250 mg | Freq: Once | INTRAMUSCULAR | Status: DC | PRN

## 2023-01-30 MED ORDER — ROPIVACAINE HCL 5 MG/ML IJ SOLN
INTRAMUSCULAR | Status: DC | PRN
  Administered 2023-01-30: 30 mL via PERINEURAL

## 2023-01-30 MED ORDER — OXYCODONE HCL 5 MG PO TABS: 5.0000 mg | ORAL_TABLET | Freq: Once | ORAL | Status: DC | PRN

## 2023-01-30 MED ORDER — HYDROMORPHONE HCL 1 MG/ML IJ SOLN: 0.2500 mg | INTRAMUSCULAR | Status: DC | PRN

## 2023-01-30 MED ORDER — PHENYLEPHRINE HCL-NACL 20-0.9 MG/250ML-% IV SOLN
INTRAVENOUS | Status: AC
  Filled 2023-01-30: qty 250

## 2023-01-30 MED ORDER — LIDOCAINE 2% (20 MG/ML) 5 ML SYRINGE
INTRAMUSCULAR | Status: DC | PRN
  Administered 2023-01-30: 60 mg via INTRAVENOUS

## 2023-01-30 MED ORDER — DEXAMETHASONE SODIUM PHOSPHATE 10 MG/ML IJ SOLN
INTRAMUSCULAR | Status: AC
Start: 2023-01-30 — End: ?
  Filled 2023-01-30: qty 1

## 2023-01-30 MED ORDER — FENTANYL CITRATE (PF) 100 MCG/2ML IJ SOLN
INTRAMUSCULAR | Status: AC
  Filled 2023-01-30: qty 2

## 2023-01-30 MED ORDER — PROPOFOL 10 MG/ML IV BOLUS
INTRAVENOUS | Status: AC
Start: 2023-01-30 — End: ?
  Filled 2023-01-30: qty 20

## 2023-01-30 MED ORDER — ORAL CARE MOUTH RINSE: 15.0000 mL | Freq: Once | OROMUCOSAL | Status: AC

## 2023-01-30 MED ORDER — PHENYLEPHRINE HCL-NACL 20-0.9 MG/250ML-% IV SOLN
INTRAVENOUS | Status: DC | PRN
  Administered 2023-01-30: 50 ug/min via INTRAVENOUS

## 2023-01-30 MED ORDER — 0.9 % SODIUM CHLORIDE (POUR BTL) OPTIME
TOPICAL | Status: DC | PRN
  Administered 2023-01-30: 1000 mL

## 2023-01-30 MED ORDER — DEXAMETHASONE SODIUM PHOSPHATE 10 MG/ML IJ SOLN
INTRAMUSCULAR | Status: DC | PRN
  Administered 2023-01-30: 8 mg via INTRAVENOUS

## 2023-01-30 MED ORDER — FENTANYL CITRATE PF 50 MCG/ML IJ SOSY
50.0000 ug | PREFILLED_SYRINGE | INTRAMUSCULAR | Status: DC
  Administered 2023-01-30: 50 ug via INTRAVENOUS
  Filled 2023-01-30: qty 2

## 2023-01-30 MED ORDER — MIDAZOLAM HCL 2 MG/2ML IJ SOLN
1.0000 mg | INTRAMUSCULAR | Status: DC
  Administered 2023-01-30: 2 mg via INTRAVENOUS
  Filled 2023-01-30: qty 2

## 2023-01-30 MED ORDER — ONDANSETRON HCL 4 MG/2ML IJ SOLN
INTRAMUSCULAR | Status: AC
Start: 2023-01-30 — End: ?
  Filled 2023-01-30: qty 2

## 2023-01-30 MED ORDER — PROPOFOL 10 MG/ML IV BOLUS
INTRAVENOUS | Status: DC | PRN
  Administered 2023-01-30: 50 mg via INTRAVENOUS
  Administered 2023-01-30: 150 mg via INTRAVENOUS

## 2023-01-30 MED ORDER — MIDAZOLAM HCL 2 MG/2ML IJ SOLN
INTRAMUSCULAR | Status: AC
Start: 2023-01-30 — End: ?
  Filled 2023-01-30: qty 2

## 2023-01-30 MED ORDER — LACTATED RINGERS IV SOLN: INTRAVENOUS | Status: DC

## 2023-01-30 MED ORDER — OXYCODONE-ACETAMINOPHEN 5-325 MG PO TABS
1.0000 | ORAL_TABLET | Freq: Four times a day (QID) | ORAL | 0 refills | Status: DC | PRN
  Filled 2023-01-30: qty 30, 4d supply, fill #0

## 2023-01-30 MED ORDER — ONDANSETRON HCL 4 MG/2ML IJ SOLN
4.0000 mg | Freq: Once | INTRAMUSCULAR | Status: DC | PRN
Start: 2023-01-30 — End: 2023-01-30

## 2023-01-30 SURGICAL SUPPLY — 44 items
BAG COUNTER SPONGE SURGICOUNT (BAG) IMPLANT
BAG ZIPLOCK 12X15 (MISCELLANEOUS) ×2 IMPLANT
BIT DRILL SHORT 2.5 (BIT) IMPLANT
BIT DRL SHORT 2.5 (BIT) ×1
BIT OVERDRILL 3.5 ZI (BIT) IMPLANT
BNDG ELASTIC 4INX 5YD STR LF (GAUZE/BANDAGES/DRESSINGS) IMPLANT
BNDG ELASTIC 6INX 5YD STR LF (GAUZE/BANDAGES/DRESSINGS) IMPLANT
COVER SURGICAL LIGHT HANDLE (MISCELLANEOUS) ×2 IMPLANT
CUFF TRNQT CYL 34X4.125X (TOURNIQUET CUFF) ×2 IMPLANT
DRAPE C-ARM 42X120 X-RAY (DRAPES) ×2 IMPLANT
DRAPE U-SHAPE 47X51 STRL (DRAPES) ×2 IMPLANT
DRIVER RETENTION T15 LONG (ORTHOPEDIC DISPOSABLE SUPPLIES) IMPLANT
DRSG ADAPTIC 3X8 NADH LF (GAUZE/BANDAGES/DRESSINGS) ×2 IMPLANT
DURAPREP 26ML APPLICATOR (WOUND CARE) ×2 IMPLANT
ELECT REM PT RETURN 15FT ADLT (MISCELLANEOUS) ×2 IMPLANT
GAUZE PAD ABD 8X10 STRL (GAUZE/BANDAGES/DRESSINGS) ×2 IMPLANT
GAUZE SPONGE 4X4 12PLY STRL (GAUZE/BANDAGES/DRESSINGS) ×2 IMPLANT
GLOVE BIO SURGEON STRL SZ8 (GLOVE) ×4 IMPLANT
GLOVE ECLIPSE 7.5 STRL STRAW (GLOVE) ×4 IMPLANT
GLOVE SURG SS PI 8.0 STRL IVOR (GLOVE) ×2 IMPLANT
GOWN STRL REUS W/ TWL XL LVL3 (GOWN DISPOSABLE) ×2 IMPLANT
K-WIRE ALPS MXV 1.6X6 ZI (WIRE) ×2
KIT TURNOVER KIT A (KITS) IMPLANT
KWIRE ALPS MXV 1.6X6 ZI (WIRE) IMPLANT
MANIFOLD NEPTUNE II (INSTRUMENTS) ×2 IMPLANT
NDL HYPO 22X1.5 SAFETY MO (MISCELLANEOUS) ×2 IMPLANT
NEEDLE HYPO 22X1.5 SAFETY MO (MISCELLANEOUS) ×1
PACK ORTHO EXTREMITY (CUSTOM PROCEDURE TRAY) ×2 IMPLANT
PAD CAST 4YDX4 CTTN HI CHSV (CAST SUPPLIES) ×4 IMPLANT
PENCIL SMOKE EVACUATOR (MISCELLANEOUS) IMPLANT
PLATE 1/3 TUBULAR 6H (Plate) IMPLANT
PROTECTOR NERVE ULNAR (MISCELLANEOUS) ×2 IMPLANT
SCREW NLOCK ALPS 3.5X14 (Screw) IMPLANT
SCREW NLOCK ALPS 3.5X18 (Screw) IMPLANT
SCREW NON-LOCK 3.5X12 (Screw) IMPLANT
SCREW NON-LOCK 3.5X16 (Screw) IMPLANT
SPIKE FLUID TRANSFER (MISCELLANEOUS) ×2 IMPLANT
SPLINT FIBERGLASS 4X30 (CAST SUPPLIES) IMPLANT
STAPLER SKIN PROX WIDE 3.9 (STAPLE) ×2 IMPLANT
SUCTION TUBE FRAZIER 12FR DISP (SUCTIONS) ×2 IMPLANT
SUT ETHILON 4 0 PS 2 18 (SUTURE) ×4 IMPLANT
SUT VIC AB 2-0 CT1 TAPERPNT 27 (SUTURE) ×2 IMPLANT
SUT VIC AB 3-0 SH 27XBRD (SUTURE) ×4 IMPLANT
SYR CONTROL 10ML LL (SYRINGE) ×2 IMPLANT

## 2023-01-30 NOTE — Anesthesia Procedure Notes (Signed)
Anesthesia Regional Block: Popliteal block   Pre-Anesthetic Checklist: , timeout performed,  Correct Patient, Correct Site, Correct Laterality,  Correct Procedure, Correct Position, site marked,  Risks and benefits discussed,  Surgical consent,  Pre-op evaluation,  At surgeon's request and post-op pain management  Laterality: Right  Prep: chloraprep       Needles:  Injection technique: Single-shot  Needle Type: Echogenic Stimulator Needle     Needle Length: 10cm  Needle Gauge: 21   Needle insertion depth: 6 cm   Additional Needles:   Procedures:,,,, ultrasound used (permanent image in chart),,    Narrative:  Start time: 01/30/2023 1:47 PM End time: 01/30/2023 1:52 PM Injection made incrementally with aspirations every 5 mL.  Performed by: Personally  Anesthesiologist: Mal Amabile, MD  Additional Notes: Timeout performed. Patient sedated. Relevant anatomy ID'd using [place]. Incremental 2-74ml injection of LA with frequent aspiration. Patient tolerated procedure well.

## 2023-01-30 NOTE — Transfer of Care (Signed)
Immediate Anesthesia Transfer of Care Note  Patient: [name]  Procedure(s) Performed: OPEN REDUCTION INTERNAL FIXATION (ORIF)   RIGHT ANKLE FRACTURE (Right: Ankle)  Patient Location: PACU  Anesthesia Type:General  Level of Consciousness: awake and alert   Airway & Oxygen Therapy: Patient Spontanous Breathing and Patient connected to face mask oxygen  Post-op Assessment: Report given to RN and Post -op Vital signs reviewed and stable  Post vital signs: Reviewed and stable  Last Vitals:  Vitals Value Taken Time  BP 121/76 01/30/23 1604  Temp    Pulse 75 01/30/23 1607  Resp 13 01/30/23 1607  SpO2 100 % 01/30/23 1607  Vitals shown include unfiled device data.  Last Pain:  Vitals:   01/30/23 1304  TempSrc:   PainSc: 0-No pain         Complications: No notable events documented.
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
## 2023-01-30 NOTE — Anesthesia Postprocedure Evaluation (Signed)
Anesthesia Post Note  Patient: [name]  Procedure(s) Performed: OPEN REDUCTION INTERNAL FIXATION (ORIF)   RIGHT ANKLE FRACTURE (Right: Ankle)     Patient location during evaluation: PACU Anesthesia Type: General Level of consciousness: awake and alert Pain management: pain level controlled Vital Signs Assessment: post-procedure vital signs reviewed and stable Respiratory status: spontaneous breathing, nonlabored ventilation and respiratory function stable Cardiovascular status: blood pressure returned to baseline and stable Postop Assessment: no apparent nausea or vomiting Anesthetic complications: no   No notable events documented.  Last Vitals:  Vitals:   01/30/23 1406 01/30/23 1604  BP: 110/72 121/76  Pulse: 76 76  Resp: 18 14  Temp:  36.5 C  SpO2: 97% 100%    Last Pain:  Vitals:   01/30/23 1304  TempSrc:   PainSc: 0-No pain                 Collene Schlichter

## 2023-01-30 NOTE — Anesthesia Preprocedure Evaluation (Addendum)
Anesthesia Evaluation  Patient identified by MRN, date of birth, ID band Patient awake    Reviewed: Allergy & Precautions, NPO status , Patient's Chart, lab work & pertinent test results, reviewed documented beta blocker date and time   Airway Mallampati: II  TM Distance: >3 FB     Dental no notable dental hx. (+) Teeth Intact, Dental Advisory Given   Pulmonary neg pulmonary ROS   Pulmonary exam normal breath sounds clear to auscultation       Cardiovascular hypertension, Pt. on medications and Pt. on home beta blockers Normal cardiovascular exam+ dysrhythmias  Rhythm:Regular Rate:Normal  Echo 07/19/22 1. Left ventricular ejection fraction, by estimation, is 55 to 60%. The  left ventricle has normal function. The left ventricle has no regional  wall motion abnormalities. Left ventricular diastolic parameters were  normal.   2. Right ventricular systolic function is normal. The right ventricular  size is normal. Tricuspid regurgitation signal is inadequate for assessing  PA pressure.   3. The mitral valve is normal in structure. No evidence of mitral valve  regurgitation. No evidence of mitral stenosis.   4. The aortic valve is tricuspid. Aortic valve regurgitation is not  visualized. No aortic stenosis is present.   5. The inferior vena cava is normal in size with greater than 50%  respiratory variability, suggesting right atrial pressure of 3 mmHg.   EKG 07/16/22 ST     Neuro/Psych   Anxiety      Neuromuscular disease    GI/Hepatic Neg liver ROS,GERD  Medicated,,  Endo/Other  Hyperlipidemia Prediabetes  Renal/GU negative Renal ROS Bladder dysfunction      Musculoskeletal  (+) Arthritis , Osteoarthritis,  Fx right ankle   Abdominal   Peds  Hematology negative hematology ROS (+)   Anesthesia Other Findings   Reproductive/Obstetrics                             Anesthesia  Physical Anesthesia Plan  ASA: 2  Anesthesia Plan: General   Post-op Pain Management: Regional block* and Minimal or no pain anticipated   Induction: Intravenous  PONV Risk Score and Plan: 4 or greater and Treatment may vary due to age or medical condition and Ondansetron  Airway Management Planned: LMA  Additional Equipment: None  Intra-op Plan:   Post-operative Plan: Extubation in OR  Informed Consent: I have reviewed the patients History and Physical, chart, labs and discussed the procedure including the risks, benefits and alternatives for the proposed anesthesia with the patient or authorized representative who has indicated his/her understanding and acceptance.     Dental advisory given  Plan Discussed with: Anesthesiologist and CRNA  Anesthesia Plan Comments:         Anesthesia Quick Evaluation

## 2023-01-30 NOTE — Anesthesia Procedure Notes (Signed)
Procedure Name: LMA Insertion Date/Time: 01/30/2023 2:52 PM  Performed by: Jamelle Rushing, CRNAPre-anesthesia Checklist: Patient identified, Emergency Drugs available, Suction available, Patient being monitored and Timeout performed Patient Re-evaluated:Patient Re-evaluated prior to induction Oxygen Delivery Method: Circle system utilized Preoxygenation: Pre-oxygenation with 100% oxygen Induction Type: IV induction LMA: LMA inserted LMA Size: 4.0 Number of attempts: 1 Airway Equipment and Method: Stylet Placement Confirmation: positive ETCO2 and breath sounds checked- equal and bilateral Tube secured with: Tape Dental Injury: Teeth and Oropharynx as per pre-operative assessment

## 2023-01-30 NOTE — Discharge Instructions (Signed)
Ambulate nonweightbearing on the right lower extremity with crutches or a walker. Elevate your right ankle is much as possible above your heart. Apply ice to the outside of your splint x 48 hours.  You may move your toes as tolerated.

## 2023-01-31 ENCOUNTER — Other Ambulatory Visit (HOSPITAL_COMMUNITY): Payer: Self-pay

## 2023-01-31 ENCOUNTER — Encounter (HOSPITAL_COMMUNITY): Payer: Self-pay | Admitting: Orthopedic Surgery

## 2023-01-31 NOTE — Op Note (Signed)
NAME: [name], [name] MEDICAL RECORD NO: [id number] ACCOUNT NO: [id number] DATE OF BIRTH: 05-31-1960 FACILITY: [facility] LOCATION: [facility] PHYSICIAN: Harvie Junior, MD  Operative Report   DATE OF PROCEDURE: 01/30/2023  PREOPERATIVE DIAGNOSIS:  Fracture, right ankle, lateral malleolus with mortise widening.  POSTOPERATIVE DIAGNOSIS:  Fracture, right ankle, lateral malleolus with mortise widening.  PROCEDURE: 1. Open reduction and internal fixation of lateral malleolus fracture. 2. Interpretation of multiple intraoperative fluoroscopic images.  SURGEON:  Harvie Junior, MD  ASSISTANT:  Gus Puma, PA-C who was present entire case and assisted by retraction of tissues, manipulation of the leg and closing to minimize OR time.  BRIEF HISTORY:  The patient is a 63 year old female with an unfortunate history of slipping and falling.  She suffered a lateral malleolus fracture with mortise widening.  We evaluated in the office and felt she needed surgical intervention.  She was  brought to the operating room for that procedure.  DESCRIPTION OF PROCEDURE:  The patient was brought to the operating room.  After adequate anesthesia was obtained with a spinal anesthetic, the patient was placed supine on the operating room table.  The patient was then prepped and draped in the usual  sterile fashion.  Following this, an incision was made over the lateral aspect of the ankle, subcutaneous tissue down lateral to the fibula, and the fibula was identified and the fracture was identified and cleansed of all healing elements.  At that  time, the fracture was anatomically reduced by manipulation and an interfragmentary screw was put in place.  Once that was completed, attention was turned towards a reduction of a neutralizing plate and a 6-hole one-third tubular plate was then placed.   Once that was completed, the final images were taken and showed that we did in fact have an anatomic reduction and the  mortise had reduced nicely at that point.  At that point, the wound was copiously irrigated and suctioned dry.  The wound was then  closed with 2-0 Vicryl and 3-0 Monocryl subcuticular.  Benzoin and Steri-Strips were applied.  A sterile compressive dressing was applied and the patient was taken to the recovery room and noted to be in satisfactory condition.  She was placed into a  short-leg splint.  Of note, Gus Puma was present the entire case assisted by retraction, manipulation, and closing.  Multiple intraoperative fluoroscopic images were taken to make sure the screw lengths were right and that the ankle had been reduced  anatomically.   PUS D: 01/31/2023 11:32:48 am T: 01/31/2023 12:33:00 pm  JOB: [id number]/ [id number]

## 2023-02-08 ENCOUNTER — Encounter: Payer: Commercial Managed Care - PPO | Admitting: Internal Medicine

## 2023-02-12 ENCOUNTER — Encounter: Payer: Commercial Managed Care - PPO | Admitting: Internal Medicine

## 2023-02-14 DIAGNOSIS — M25571 Pain in right ankle and joints of right foot: Secondary | ICD-10-CM | POA: Diagnosis not present

## 2023-02-18 ENCOUNTER — Other Ambulatory Visit (HOSPITAL_COMMUNITY): Payer: Self-pay

## 2023-02-26 DIAGNOSIS — M25571 Pain in right ankle and joints of right foot: Secondary | ICD-10-CM | POA: Diagnosis not present

## 2023-03-08 ENCOUNTER — Ambulatory Visit: Payer: Commercial Managed Care - PPO | Admitting: Internal Medicine

## 2023-03-08 ENCOUNTER — Other Ambulatory Visit (HOSPITAL_BASED_OUTPATIENT_CLINIC_OR_DEPARTMENT_OTHER): Payer: Self-pay

## 2023-03-08 ENCOUNTER — Other Ambulatory Visit (HOSPITAL_COMMUNITY): Payer: Self-pay

## 2023-03-08 ENCOUNTER — Encounter: Payer: Self-pay | Admitting: Internal Medicine

## 2023-03-08 VITALS — BP 120/80 | HR 93 | Temp 98.1°F | Ht 68.5 in | Wt 193.0 lb

## 2023-03-08 DIAGNOSIS — Z0000 Encounter for general adult medical examination without abnormal findings: Secondary | ICD-10-CM

## 2023-03-08 DIAGNOSIS — R7303 Prediabetes: Secondary | ICD-10-CM

## 2023-03-08 DIAGNOSIS — E782 Mixed hyperlipidemia: Secondary | ICD-10-CM

## 2023-03-08 DIAGNOSIS — I10 Essential (primary) hypertension: Secondary | ICD-10-CM

## 2023-03-08 LAB — POCT GLYCOSYLATED HEMOGLOBIN (HGB A1C): HbA1c POC (<> result, manual entry): 5.4 % (ref 4.0–5.6)

## 2023-03-08 MED ORDER — FLUTICASONE PROPIONATE 50 MCG/ACT NA SUSP
2.0000 | Freq: Every day | NASAL | 3 refills | Status: DC
  Filled 2023-03-08 – 2023-03-26 (×3): qty 16, 30d supply, fill #0
  Filled 2023-05-27: qty 16, 30d supply, fill #1
  Filled 2023-06-21: qty 16, 30d supply, fill #2
  Filled 2023-07-22: qty 16, 30d supply, fill #3

## 2023-03-08 NOTE — Assessment & Plan Note (Signed)
 POC HgA1c testing done and 5.4 which is improved. We will still monitor yearly given past levels consistent with pre-diabetes.

## 2023-03-08 NOTE — Progress Notes (Signed)
   Subjective:   Patient ID: [name], female    DOB: Sep 17, 1960, 63 y.o.   MRN: [id number]  HPI The patient is here for physical.  PMH, [facility], social history reviewed and updated  Review of Systems  Constitutional: Negative.   HENT: Negative.    Eyes: Negative.   Respiratory:  Negative for cough, chest tightness and shortness of breath.   Cardiovascular:  Negative for chest pain, palpitations and leg swelling.  Gastrointestinal:  Negative for abdominal distention, abdominal pain, constipation, diarrhea, nausea and vomiting.  Musculoskeletal:  Positive for arthralgias.  Skin: Negative.   Neurological: Negative.   Psychiatric/Behavioral: Negative.      Objective:  Physical Exam Constitutional:      Appearance: She is well-developed.  HENT:     Head: Normocephalic and atraumatic.  Cardiovascular:     Rate and Rhythm: Normal rate and regular rhythm.  Pulmonary:     Effort: Pulmonary effort is normal. No respiratory distress.     Breath sounds: Normal breath sounds. No wheezing or rales.  Abdominal:     General: Bowel sounds are normal. There is no distension.     Palpations: Abdomen is soft.     Tenderness: There is no abdominal tenderness. There is no rebound.  Musculoskeletal:        General: Tenderness present.     Cervical back: Normal range of motion.     Comments: Right foot in boot  Skin:    General: Skin is warm and dry.  Neurological:     Mental Status: She is alert and oriented to person, place, and time.     Coordination: Coordination normal.     Vitals:   03/08/23 1208  BP: 120/80  Pulse: 93  Temp: 98.1 F (36.7 C)  TempSrc: Oral  SpO2: 98%  Weight: 193 lb (87.5 kg)  Height: 5' 8.5" (1.74 m)    Assessment & Plan:

## 2023-03-08 NOTE — Assessment & Plan Note (Signed)
 BP at goal on amlodipine and atenolol. Will continue recent labs will not repeat.

## 2023-03-08 NOTE — Assessment & Plan Note (Signed)
 Taking lipitor and labs improved will continue.

## 2023-03-08 NOTE — Assessment & Plan Note (Signed)
 Flu shot yearly. Shingrix complete. Tetanus counseled. Colonoscopy up to date. Mammogram up to date, pap smear up to date. Counseled about sun safety and mole surveillance. Counseled about the dangers of distracted driving. Given 10 year screening recommendations.

## 2023-03-14 DIAGNOSIS — M25571 Pain in right ankle and joints of right foot: Secondary | ICD-10-CM | POA: Diagnosis not present

## 2023-03-19 DIAGNOSIS — M25671 Stiffness of right ankle, not elsewhere classified: Secondary | ICD-10-CM | POA: Diagnosis not present

## 2023-03-19 DIAGNOSIS — R531 Weakness: Secondary | ICD-10-CM | POA: Diagnosis not present

## 2023-03-21 DIAGNOSIS — R531 Weakness: Secondary | ICD-10-CM | POA: Diagnosis not present

## 2023-03-21 DIAGNOSIS — M25671 Stiffness of right ankle, not elsewhere classified: Secondary | ICD-10-CM | POA: Diagnosis not present

## 2023-03-25 ENCOUNTER — Other Ambulatory Visit (HOSPITAL_COMMUNITY): Payer: Self-pay

## 2023-03-26 ENCOUNTER — Other Ambulatory Visit (HOSPITAL_COMMUNITY): Payer: Self-pay

## 2023-03-26 DIAGNOSIS — R531 Weakness: Secondary | ICD-10-CM | POA: Diagnosis not present

## 2023-03-26 DIAGNOSIS — M25671 Stiffness of right ankle, not elsewhere classified: Secondary | ICD-10-CM | POA: Diagnosis not present

## 2023-03-28 ENCOUNTER — Other Ambulatory Visit (HOSPITAL_COMMUNITY): Payer: Self-pay

## 2023-03-28 DIAGNOSIS — M25671 Stiffness of right ankle, not elsewhere classified: Secondary | ICD-10-CM | POA: Diagnosis not present

## 2023-03-28 DIAGNOSIS — R531 Weakness: Secondary | ICD-10-CM | POA: Diagnosis not present

## 2023-04-02 DIAGNOSIS — M25671 Stiffness of right ankle, not elsewhere classified: Secondary | ICD-10-CM | POA: Diagnosis not present

## 2023-04-02 DIAGNOSIS — R531 Weakness: Secondary | ICD-10-CM | POA: Diagnosis not present

## 2023-04-03 DIAGNOSIS — M25671 Stiffness of right ankle, not elsewhere classified: Secondary | ICD-10-CM | POA: Diagnosis not present

## 2023-04-03 DIAGNOSIS — R531 Weakness: Secondary | ICD-10-CM | POA: Diagnosis not present

## 2023-04-09 DIAGNOSIS — R531 Weakness: Secondary | ICD-10-CM | POA: Diagnosis not present

## 2023-04-09 DIAGNOSIS — M25671 Stiffness of right ankle, not elsewhere classified: Secondary | ICD-10-CM | POA: Diagnosis not present

## 2023-04-10 ENCOUNTER — Other Ambulatory Visit (HOSPITAL_COMMUNITY): Payer: Self-pay

## 2023-04-10 ENCOUNTER — Other Ambulatory Visit: Payer: Self-pay

## 2023-04-11 ENCOUNTER — Other Ambulatory Visit (HOSPITAL_COMMUNITY): Payer: Self-pay

## 2023-04-11 DIAGNOSIS — R531 Weakness: Secondary | ICD-10-CM | POA: Diagnosis not present

## 2023-04-11 DIAGNOSIS — M25671 Stiffness of right ankle, not elsewhere classified: Secondary | ICD-10-CM | POA: Diagnosis not present

## 2023-04-11 MED ORDER — GABAPENTIN 300 MG PO CAPS
300.0000 mg | ORAL_CAPSULE | Freq: Every day | ORAL | 0 refills | Status: DC
  Filled 2023-04-11: qty 30, 30d supply, fill #0

## 2023-04-16 DIAGNOSIS — R531 Weakness: Secondary | ICD-10-CM | POA: Diagnosis not present

## 2023-04-16 DIAGNOSIS — M25671 Stiffness of right ankle, not elsewhere classified: Secondary | ICD-10-CM | POA: Diagnosis not present

## 2023-04-17 DIAGNOSIS — R531 Weakness: Secondary | ICD-10-CM | POA: Diagnosis not present

## 2023-04-17 DIAGNOSIS — M25671 Stiffness of right ankle, not elsewhere classified: Secondary | ICD-10-CM | POA: Diagnosis not present

## 2023-04-18 ENCOUNTER — Ambulatory Visit: Admitting: Internal Medicine

## 2023-04-18 ENCOUNTER — Encounter: Payer: Self-pay | Admitting: Internal Medicine

## 2023-04-18 ENCOUNTER — Other Ambulatory Visit (HOSPITAL_COMMUNITY): Payer: Self-pay

## 2023-04-18 ENCOUNTER — Ambulatory Visit (INDEPENDENT_AMBULATORY_CARE_PROVIDER_SITE_OTHER)

## 2023-04-18 VITALS — BP 124/80 | HR 94 | Temp 98.3°F | Ht 68.5 in | Wt 191.0 lb

## 2023-04-18 DIAGNOSIS — R059 Cough, unspecified: Secondary | ICD-10-CM | POA: Diagnosis not present

## 2023-04-18 DIAGNOSIS — R052 Subacute cough: Secondary | ICD-10-CM

## 2023-04-18 DIAGNOSIS — Z780 Asymptomatic menopausal state: Secondary | ICD-10-CM

## 2023-04-18 MED ORDER — AZITHROMYCIN 250 MG PO TABS
ORAL_TABLET | ORAL | 0 refills | Status: AC
  Filled 2023-04-18: qty 6, 5d supply, fill #0

## 2023-04-18 NOTE — Progress Notes (Signed)
   Subjective:   Patient ID: [name], female    DOB: 08-28-1960, 63 y.o.   MRN: [id number]  HPI The patient is a 63 YO female coming in for sick visit congestion in chest 2 weeks some yellow and green sputum. Overall not improving.   Review of Systems  Constitutional: Negative.   HENT:  Positive for congestion.   Eyes:  Positive for redness.  Respiratory:  Positive for cough and chest tightness. Negative for shortness of breath.   Cardiovascular:  Negative for chest pain, palpitations and leg swelling.  Gastrointestinal:  Negative for abdominal distention, abdominal pain, constipation, diarrhea, nausea and vomiting.  Musculoskeletal: Negative.   Skin: Negative.   Neurological: Negative.   Psychiatric/Behavioral: Negative.      Objective:  Physical Exam Constitutional:      Appearance: She is well-developed.  HENT:     Head: Normocephalic and atraumatic.     Comments: Oropharynx with redness and clear drainage, nose with swollen turbinates, TMs normal bilaterally.  Neck:     Thyroid: No thyromegaly.  Cardiovascular:     Rate and Rhythm: Normal rate and regular rhythm.  Pulmonary:     Effort: Pulmonary effort is normal. No respiratory distress.     Breath sounds: Rhonchi present. No wheezing or rales.  Abdominal:     General: Bowel sounds are normal. There is no distension.     Palpations: Abdomen is soft.     Tenderness: There is no abdominal tenderness. There is no rebound.  Musculoskeletal:        General: Tenderness present.     Cervical back: Normal range of motion.  Lymphadenopathy:     Cervical: No cervical adenopathy.  Skin:    General: Skin is warm and dry.  Neurological:     Mental Status: She is alert and oriented to person, place, and time.     Coordination: Coordination normal.     Vitals:   04/18/23 1034  BP: 124/80  Pulse: 94  Temp: 98.3 F (36.8 C)  TempSrc: Oral  SpO2: 97%  Weight: 191 lb (86.6 kg)  Height: 5' 8.5" (1.74 m)     Assessment & Plan:

## 2023-04-18 NOTE — Assessment & Plan Note (Signed)
 With rhonchi on exam checking CXR and empirically treating for CAP with azithromycin rx done.

## 2023-04-23 DIAGNOSIS — Z1231 Encounter for screening mammogram for malignant neoplasm of breast: Secondary | ICD-10-CM | POA: Diagnosis not present

## 2023-04-24 DIAGNOSIS — M25671 Stiffness of right ankle, not elsewhere classified: Secondary | ICD-10-CM | POA: Diagnosis not present

## 2023-04-24 DIAGNOSIS — R531 Weakness: Secondary | ICD-10-CM | POA: Diagnosis not present
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
## 2023-04-30 ENCOUNTER — Encounter: Payer: Self-pay | Admitting: Internal Medicine

## 2023-04-30 ENCOUNTER — Ambulatory Visit: Payer: Commercial Managed Care - PPO | Attending: Internal Medicine | Admitting: Internal Medicine

## 2023-04-30 VITALS — BP 124/90 | HR 96 | Ht 68.0 in | Wt 193.0 lb

## 2023-04-30 DIAGNOSIS — E785 Hyperlipidemia, unspecified: Secondary | ICD-10-CM | POA: Diagnosis not present

## 2023-04-30 DIAGNOSIS — Z79899 Other long term (current) drug therapy: Secondary | ICD-10-CM | POA: Diagnosis not present

## 2023-04-30 NOTE — Patient Instructions (Signed)
 Medication Instructions:   *If you need a refill on your cardiac medications before your next appointment, please call your pharmacy*  Lab Work: NMR AND HGBA1C PRIOR TO YOUR NEXT APPT IN FEB 2026 If you have labs (blood work) drawn today and your tests are completely normal, you will receive your results only by: MyChart Message (if you have MyChart) OR A paper copy in the mail If you have any lab test that is abnormal or we need to change your treatment, we will call you to review the results.  Testing/Procedures:   Follow-Up: At [facility], you and your health needs are our priority.  As part of our continuing mission to provide you with exceptional heart care, our providers are all part of one team.  This team includes your primary Cardiologist (physician) and Advanced Practice Providers or APPs (Physician Assistants and Nurse Practitioners) who all work together to provide you with the care you need, when you need it.  Your next appointment:  FEB 2026 WITH LABS PRIOR  We recommend signing up for the patient portal called "MyChart".  Sign up information is provided on this After Visit Summary.  MyChart is used to connect with patients for Virtual Visits (Telemedicine).  Patients are able to view lab/test results, encounter notes, upcoming appointments, etc.  Non-urgent messages can be sent to your provider as well.   To learn more about what you can do with MyChart, go to [email or web address].   Other Instructions       1st Floor: - Lobby - Registration  - Pharmacy  - Lab - Cafe  2nd Floor: - PV Lab - Diagnostic Testing (echo, CT, nuclear med)  3rd Floor: - Vacant  4th Floor: - TCTS (cardiothoracic surgery) - AFib Clinic - Structural Heart Clinic - Vascular Surgery  - Vascular Ultrasound  5th Floor: - HeartCare Cardiology (general and EP) - Clinical Pharmacy for coumadin, hypertension, lipid, weight-loss medications, and med management  appointments    Valet parking services will be available as well.

## 2023-05-28 DIAGNOSIS — M25571 Pain in right ankle and joints of right foot: Secondary | ICD-10-CM | POA: Diagnosis not present

## 2023-07-08 ENCOUNTER — Other Ambulatory Visit (HOSPITAL_BASED_OUTPATIENT_CLINIC_OR_DEPARTMENT_OTHER): Payer: Self-pay | Admitting: Obstetrics and Gynecology

## 2023-07-08 DIAGNOSIS — Z90710 Acquired absence of both cervix and uterus: Secondary | ICD-10-CM | POA: Diagnosis not present

## 2023-07-08 DIAGNOSIS — Z1389 Encounter for screening for other disorder: Secondary | ICD-10-CM | POA: Diagnosis not present

## 2023-07-08 DIAGNOSIS — Z01419 Encounter for gynecological examination (general) (routine) without abnormal findings: Secondary | ICD-10-CM | POA: Diagnosis not present

## 2023-07-08 DIAGNOSIS — E2839 Other primary ovarian failure: Secondary | ICD-10-CM | POA: Diagnosis not present

## 2023-07-08 DIAGNOSIS — I10 Essential (primary) hypertension: Secondary | ICD-10-CM | POA: Diagnosis not present

## 2023-07-15 ENCOUNTER — Ambulatory Visit: Admitting: Podiatry

## 2023-07-15 ENCOUNTER — Encounter: Payer: Self-pay | Admitting: Podiatry

## 2023-07-15 ENCOUNTER — Other Ambulatory Visit (HOSPITAL_COMMUNITY): Payer: Self-pay

## 2023-07-15 DIAGNOSIS — H52203 Unspecified astigmatism, bilateral: Secondary | ICD-10-CM | POA: Diagnosis not present

## 2023-07-15 DIAGNOSIS — G5763 Lesion of plantar nerve, bilateral lower limbs: Secondary | ICD-10-CM | POA: Diagnosis not present

## 2023-07-15 DIAGNOSIS — H524 Presbyopia: Secondary | ICD-10-CM | POA: Diagnosis not present

## 2023-07-15 DIAGNOSIS — H5203 Hypermetropia, bilateral: Secondary | ICD-10-CM | POA: Diagnosis not present
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
## 2023-07-22 ENCOUNTER — Other Ambulatory Visit: Payer: Self-pay | Admitting: Internal Medicine

## 2023-07-22 ENCOUNTER — Other Ambulatory Visit: Payer: Self-pay

## 2023-07-23 ENCOUNTER — Other Ambulatory Visit: Payer: Self-pay | Admitting: Internal Medicine

## 2023-07-24 ENCOUNTER — Other Ambulatory Visit (HOSPITAL_BASED_OUTPATIENT_CLINIC_OR_DEPARTMENT_OTHER): Payer: Self-pay

## 2023-07-24 ENCOUNTER — Other Ambulatory Visit (HOSPITAL_COMMUNITY): Payer: Self-pay

## 2023-07-24 MED ORDER — AMLODIPINE BESYLATE 5 MG PO TABS
5.0000 mg | ORAL_TABLET | Freq: Two times a day (BID) | ORAL | 2 refills | Status: AC
  Filled 2023-07-24: qty 180, 90d supply, fill #0
  Filled 2023-10-19: qty 180, 90d supply, fill #1
  Filled 2024-01-13: qty 180, 90d supply, fill #2

## 2023-07-24 MED ORDER — ATORVASTATIN CALCIUM 10 MG PO TABS
10.0000 mg | ORAL_TABLET | Freq: Every day | ORAL | 3 refills | Status: DC
  Filled 2023-07-24: qty 90, 90d supply, fill #0
  Filled 2023-10-05 – 2023-10-08 (×2): qty 90, 90d supply, fill #1
  Filled 2023-12-30: qty 90, 90d supply, fill #2

## 2023-07-26 ENCOUNTER — Other Ambulatory Visit (HOSPITAL_COMMUNITY): Payer: Self-pay

## 2023-07-27 ENCOUNTER — Other Ambulatory Visit (HOSPITAL_COMMUNITY): Payer: Self-pay

## 2023-07-29 ENCOUNTER — Ambulatory Visit: Admitting: Family Medicine

## 2023-07-29 ENCOUNTER — Encounter: Payer: Self-pay | Admitting: Family Medicine

## 2023-07-29 ENCOUNTER — Other Ambulatory Visit (HOSPITAL_COMMUNITY): Payer: Self-pay

## 2023-07-29 ENCOUNTER — Encounter: Payer: Self-pay | Admitting: Internal Medicine

## 2023-07-29 VITALS — BP 138/78 | HR 99 | Temp 98.7°F | Resp 18 | Ht 68.0 in | Wt 190.0 lb

## 2023-07-29 DIAGNOSIS — J3489 Other specified disorders of nose and nasal sinuses: Secondary | ICD-10-CM | POA: Diagnosis not present

## 2023-07-29 MED ORDER — TOLTERODINE TARTRATE ER 4 MG PO CP24
4.0000 mg | ORAL_CAPSULE | Freq: Every day | ORAL | 2 refills | Status: AC
  Filled 2023-07-29: qty 90, 90d supply, fill #0
  Filled 2023-10-24: qty 90, 90d supply, fill #1
  Filled 2024-01-22: qty 90, 90d supply, fill #2

## 2023-07-29 NOTE — Patient Instructions (Signed)
 Saline nasal spray Vaseline on a q-tip

## 2023-07-29 NOTE — Progress Notes (Signed)
 Assessment & Plan:  1. Sore in nose (Primary) Education provided on epistaxis. Advised to stop Flonase  and start using a saline nasal spray and apply Vaseline into the nare with a q-tip.    Follow up plan: Return if symptoms worsen or fail to improve.  Niki Rung, MSN, APRN, FNP-C  Subjective:  HPI: [name] is a 63 y.o. female presenting on 07/29/2023 for sore nose (Slight blood in nasal draiange, using flonase . Feels like a sore in there.(No resp s/s))  Patient reports a sore in her nose on the left side that she noticed almost a week ago. When she blows her nose there is a slight amount of blood. She has been using Flonase , but just stopped as her nose would bleed more after using. Denies sneezing, runny nose, ear pain/congestion, and cough. She has had a slight headache.    ROS: Negative unless specifically indicated above in HPI.   Relevant past medical history reviewed and updated as indicated.   Allergies and medications reviewed and updated.   Current Outpatient Medications:    acetaminophen  (TYLENOL ) 650 MG CR tablet, Take 1,300 mg by mouth every 8 (eight) hours as needed for pain., Disp: , Rfl:    amLODipine  (NORVASC ) 5 MG tablet, Take 1 tablet (5 mg total) by mouth in the morning and at bedtime., Disp: 180 tablet, Rfl: 2   aspirin 81 MG tablet, Take 81 mg by mouth daily., Disp: , Rfl:    atenolol  (TENORMIN ) 100 MG tablet, Take 1 tablet (100 mg total) by mouth daily., Disp: 135 tablet, Rfl: 3   atorvastatin  (LIPITOR) 10 MG tablet, Take 1 tablet (10 mg total) by mouth daily., Disp: 90 tablet, Rfl: 3   Cholecalciferol (VITAMIN D ) 50 MCG (2000 UT) tablet, Take 2,000 Units by mouth daily., Disp: , Rfl:    Magnesium  400 MG TABS, Take 400 mg by mouth daily., Disp: , Rfl:    Multiple Vitamins-Minerals (MULTIVITAMIN & MINERAL PO), Take 1 tablet by mouth daily., Disp: , Rfl:    pantoprazole  (PROTONIX ) 40 MG tablet, Take 1 tablet (40 mg total) by mouth 2 (two) times  daily before a meal., Disp: 180 tablet, Rfl: 3   Scar Treatment Products [facility] ADVANCED SCAR GEL) GEL, Apply 1 Application topically daily as needed (scar)., Disp: , Rfl:    tolterodine  (DETROL  LA) 4 MG 24 hr capsule, Take 1 capsule (4 mg total) by mouth daily., Disp: 90 capsule, Rfl: 2   TURMERIC PO, Take 500 mg by mouth 2 (two) times daily., Disp: , Rfl:    fluticasone  (FLONASE ) 50 MCG/ACT nasal spray, Place 2 sprays into both nostrils daily. (Patient not taking: Reported on 07/29/2023), Disp: 16 g, Rfl: 3  No Known Allergies  Objective:   BP 138/78   Pulse 99   Temp 98.7 F (37.1 C)   Resp 18   Ht 5' 8 (1.727 m)   Wt 190 lb (86.2 kg)   SpO2 99%   BMI 28.89 kg/m    Physical Exam Vitals reviewed.  Constitutional:      General: She is not in acute distress.    Appearance: Normal appearance. She is not ill-appearing, toxic-appearing or diaphoretic.  HENT:     Head: Normocephalic and atraumatic.     Nose: Congestion present.     Comments: Sore in left side of nose Eyes:     General: No scleral icterus.       Right eye: No discharge.  Left eye: No discharge.     Conjunctiva/sclera: Conjunctivae normal.  Cardiovascular:     Rate and Rhythm: Normal rate.  Pulmonary:     Effort: Pulmonary effort is normal. No respiratory distress.  Musculoskeletal:        General: Normal range of motion.     Cervical back: Normal range of motion.  Skin:    General: Skin is warm and dry.     Capillary Refill: Capillary refill takes less than 2 seconds.  Neurological:     General: No focal deficit present.     Mental Status: She is alert and oriented to person, place, and time. Mental status is at baseline.  Psychiatric:        Mood and Affect: Mood normal.        Behavior: Behavior normal.        Thought Content: Thought content normal.        Judgment: Judgment normal.

## 2023-07-30 NOTE — Telephone Encounter (Signed)
**Note De-identified  [place] Obfuscation** Please advise 

## 2023-08-01 NOTE — Addendum Note (Signed)
 Addended by: ROSALVA LEX RAMAN on: 08/01/2023 08:05 AM   Modules accepted: Orders

## 2023-08-01 NOTE — Telephone Encounter (Signed)
 Done

## 2023-08-02 ENCOUNTER — Other Ambulatory Visit (HOSPITAL_COMMUNITY): Payer: Self-pay

## 2023-08-08 ENCOUNTER — Inpatient Hospital Stay (INDEPENDENT_AMBULATORY_CARE_PROVIDER_SITE_OTHER)
Admission: RE | Admit: 2023-08-08 | Discharge: 2023-08-08 | Discharge status: Home / Self Care | Source: Ambulatory Visit | Attending: Family | Admitting: Family

## 2023-08-08 ENCOUNTER — Other Ambulatory Visit

## 2023-08-08 DIAGNOSIS — Z780 Asymptomatic menopausal state: Secondary | ICD-10-CM | POA: Diagnosis not present

## 2023-08-08 DIAGNOSIS — Z13820 Encounter for screening for osteoporosis: Secondary | ICD-10-CM | POA: Diagnosis not present

## 2023-08-12 ENCOUNTER — Telehealth: Payer: Self-pay | Admitting: Internal Medicine

## 2023-08-12 ENCOUNTER — Ambulatory Visit: Payer: Self-pay | Admitting: Internal Medicine

## 2023-08-12 LAB — HM DEXA SCAN: HM Dexa Scan: -0.6

## 2023-08-12 NOTE — Telephone Encounter (Unsigned)
 Copied from CRM [id number]. Topic: General - Other >> Aug 12, 2023  8:35 AM [facility] ORN wrote: Reason for CRM: Patient is calling in due needing a copy of results. Nathanel Bunker is requesting a copy of them. She can be reached at [phone]. The fax number is [phone].

## 2023-09-16 ENCOUNTER — Other Ambulatory Visit: Payer: Self-pay | Admitting: Internal Medicine

## 2023-09-16 ENCOUNTER — Other Ambulatory Visit (HOSPITAL_COMMUNITY): Payer: Self-pay

## 2023-09-16 MED ORDER — PANTOPRAZOLE SODIUM 40 MG PO TBEC
40.0000 mg | DELAYED_RELEASE_TABLET | Freq: Two times a day (BID) | ORAL | 3 refills | Status: AC
  Filled 2023-09-16: qty 180, 90d supply, fill #0
  Filled 2023-12-16: qty 180, 90d supply, fill #1

## 2023-10-05 ENCOUNTER — Other Ambulatory Visit (HOSPITAL_COMMUNITY): Payer: Self-pay

## 2023-10-09 ENCOUNTER — Encounter: Payer: Self-pay | Admitting: Family Medicine

## 2023-10-09 ENCOUNTER — Ambulatory Visit (INDEPENDENT_AMBULATORY_CARE_PROVIDER_SITE_OTHER)

## 2023-10-09 ENCOUNTER — Ambulatory Visit: Admitting: Family Medicine

## 2023-10-09 VITALS — BP 132/80 | HR 101 | Ht 68.0 in | Wt 188.0 lb

## 2023-10-09 DIAGNOSIS — G8929 Other chronic pain: Secondary | ICD-10-CM

## 2023-10-09 DIAGNOSIS — M545 Low back pain, unspecified: Secondary | ICD-10-CM | POA: Diagnosis not present

## 2023-10-09 DIAGNOSIS — M47816 Spondylosis without myelopathy or radiculopathy, lumbar region: Secondary | ICD-10-CM | POA: Diagnosis not present

## 2023-10-09 NOTE — Progress Notes (Unsigned)
   I, Leotis Batter, CMA acting as a scribe for Artist Lloyd, MD.  [name] is a 63 y.o. female who presents to [facility] today for back pain. Pt was previously seen by Dr. Claudene in 2022-23.  Today, pt c/o bilateral lower back pain radiating into the gluteal region, hips, and occasionally into the legs. Denies n/t/w but does not having mortons neuroma. Sx exacerbated by bending/squatting. Minimal relief witih Tylenol , heat, or Salonpas.   Radiating pain: gluteal region, hips LE numbness/tingling: denies LE weakness: denies Aggravates: bending, squatting Treatments tried: Tylenol , Heat, Salonpas  Dx testing: 08/08/23 DEXA  03/09/20 L-spine XR  02/02/20 T-spine XR  Pertinent review of systems: No fevers or chills  Relevant historical information: Patient has had to have surgery twice in the last year or so.  She had a right rotator cuff repair and slipped and fell on the ice in January and had a ankle fracture that required surgery.  She finished physical therapy for the ankle in April of this year.  She went to physical therapy at [facility] orthopedics.   Exam:  BP 132/80   Pulse (!) 101   Ht 5' 8 (1.727 m)   Wt 188 lb (85.3 kg)   SpO2 98%   BMI 28.59 kg/m  General: Well Developed, well nourished, and in no acute distress.   MSK: L-spine: Normal-appearing Nontender palpation midline.  Tender palpation paraspinal musculature. Decreased lumbar motion. Lower extremity strength is intact. Reflexes are intact.    Lab and Radiology Results  X-ray images lumbar spine obtained today personally and independently interpreted. DDD L5-S1.  No acute fractures are visible. Await formal radiology review    Assessment and Plan: 63 y.o. female with chronic low back pain due to degenerative changes and muscle spasm/exacerbation of muscle dysfunction.  Plan for home exercise program heating pad and over-the-counter medications.  We did talk about  muscle relaxers and decided again them at this time.  Next step would be physical therapy referral.  She will let me know.   PDMP not reviewed this encounter. Orders Placed This Encounter  Procedures   DG Lumbar Spine 2-3 Views    Standing Status:   Future    Number of Occurrences:   1    Expiration Date:   11/09/2023    Reason for Exam (SYMPTOM  OR DIAGNOSIS REQUIRED):   low back pain    Preferred imaging location?:   [facility]   No orders of the defined types were placed in this encounter.    Discussed warning signs or symptoms. Please see discharge instructions. Patient expresses understanding.   The above documentation has been reviewed and is accurate and complete Artist Lloyd, M.D.

## 2023-10-09 NOTE — Patient Instructions (Addendum)
 Thank you for coming in today.   Please work on the home exercises the athletic trainer went over with you:  View at [email or web address] code [email or web address]  Please get an Xray today before you leave   Try using a heating pad  Let me know if you would like a referral to physical therapy

## 2023-10-15 ENCOUNTER — Ambulatory Visit: Payer: Self-pay | Admitting: Family Medicine

## 2023-10-15 NOTE — Progress Notes (Signed)
 Low back x-ray shows medium to severe arthritis changes.  These are especially visible at the base of the spine.

## 2023-10-21 ENCOUNTER — Telehealth: Payer: Self-pay

## 2023-10-21 DIAGNOSIS — E782 Mixed hyperlipidemia: Secondary | ICD-10-CM

## 2023-10-21 DIAGNOSIS — R7303 Prediabetes: Secondary | ICD-10-CM

## 2023-10-21 DIAGNOSIS — I10 Essential (primary) hypertension: Secondary | ICD-10-CM

## 2023-10-21 NOTE — Telephone Encounter (Signed)
 Copied from CRM [id number]. Topic: Clinical - Request for Lab/Test Order >> Oct 21, 2023 10:18 AM [name] wrote: Pt request lab orders prior to her Physical exam in March

## 2023-10-23 NOTE — Addendum Note (Signed)
 Addended by: ROLLENE NORRIS A on: 10/23/2023 10:05 AM   Modules accepted: Orders

## 2023-10-23 NOTE — Telephone Encounter (Signed)
 Labs placed.

## 2023-10-24 ENCOUNTER — Other Ambulatory Visit (INDEPENDENT_AMBULATORY_CARE_PROVIDER_SITE_OTHER)

## 2023-10-24 DIAGNOSIS — I10 Essential (primary) hypertension: Secondary | ICD-10-CM | POA: Diagnosis not present

## 2023-10-24 DIAGNOSIS — R7303 Prediabetes: Secondary | ICD-10-CM

## 2023-10-24 DIAGNOSIS — E782 Mixed hyperlipidemia: Secondary | ICD-10-CM

## 2023-10-24 LAB — COMPREHENSIVE METABOLIC PANEL WITH GFR
ALT: 27 U/L (ref 0–35)
AST: 18 U/L (ref 0–37)
Albumin: 4.7 g/dL (ref 3.5–5.2)
Alkaline Phosphatase: 97 U/L (ref 39–117)
BUN: 23 mg/dL (ref 6–23)
CO2: 26 meq/L (ref 19–32)
Calcium: 9.6 mg/dL (ref 8.4–10.5)
Chloride: 106 meq/L (ref 96–112)
Creatinine, Ser: 0.89 mg/dL (ref 0.40–1.20)
GFR: 68.33 mL/min (ref >60.00)
Glucose, Bld: 110 mg/dL — ABNORMAL HIGH (ref 70–99)
Potassium: 3.7 meq/L (ref 3.5–5.1)
Sodium: 143 meq/L (ref 135–145)
Total Bilirubin: 0.8 mg/dL (ref 0.2–1.2)
Total Protein: 7.6 g/dL (ref 6.0–8.3)

## 2023-10-24 LAB — LIPID PANEL
Cholesterol: 173 mg/dL (ref 0–200)
HDL: 56.4 mg/dL (ref >39.00)
LDL Cholesterol: 104 mg/dL — ABNORMAL HIGH (ref 0–99)
NonHDL: 116.15
Total CHOL/HDL Ratio: 3
Triglycerides: 60 mg/dL (ref 0.0–149.0)
VLDL: 12 mg/dL (ref 0.0–40.0)

## 2023-10-24 LAB — CBC
HCT: 41.9 % (ref 36.0–46.0)
Hemoglobin: 13.6 g/dL (ref 12.0–15.0)
MCHC: 32.4 g/dL (ref 30.0–36.0)
MCV: 77 fl — ABNORMAL LOW (ref 78.0–100.0)
Platelets: 325 K/uL (ref 150.0–400.0)
RBC: 5.44 Mil/uL — ABNORMAL HIGH (ref 3.87–5.11)
RDW: 13.5 % (ref 11.5–15.5)
WBC: 4.3 K/uL (ref 4.0–10.5)

## 2023-10-24 LAB — HEMOGLOBIN A1C: Hgb A1c MFr Bld: 6.1 % (ref 4.6–6.5)

## 2023-10-28 ENCOUNTER — Telehealth: Payer: Self-pay | Admitting: Internal Medicine

## 2023-10-28 ENCOUNTER — Ambulatory Visit: Payer: Self-pay | Admitting: Internal Medicine

## 2023-10-28 ENCOUNTER — Other Ambulatory Visit (HOSPITAL_COMMUNITY): Payer: Self-pay

## 2023-10-28 MED ORDER — ONDANSETRON HCL 4 MG PO TABS
4.0000 mg | ORAL_TABLET | Freq: Three times a day (TID) | ORAL | 1 refills | Status: AC | PRN
  Filled 2023-10-28: qty 30, 10d supply, fill #0

## 2023-10-28 NOTE — Telephone Encounter (Signed)
 Nausea x 4 days, intermittent and then constant, no vomiting Feels like something in there in  RUQ, not tender  No BM changes No fever Able to sleep  Tried motion sickness med no help Does not have vertigo  Did pe labs 10/16 and unrevealing  BP ok  Will Rx ondansetron  prn and will f/u 1-2 days Soft bland diet

## 2023-10-31 ENCOUNTER — Other Ambulatory Visit (HOSPITAL_COMMUNITY): Payer: Self-pay

## 2023-10-31 ENCOUNTER — Encounter: Payer: Self-pay | Admitting: Internal Medicine

## 2023-10-31 ENCOUNTER — Other Ambulatory Visit (INDEPENDENT_AMBULATORY_CARE_PROVIDER_SITE_OTHER)

## 2023-10-31 ENCOUNTER — Ambulatory Visit: Admitting: Internal Medicine

## 2023-10-31 ENCOUNTER — Telehealth: Payer: Self-pay

## 2023-10-31 VITALS — BP 110/68 | HR 85 | Ht 68.0 in | Wt 182.0 lb

## 2023-10-31 DIAGNOSIS — R634 Abnormal weight loss: Secondary | ICD-10-CM

## 2023-10-31 DIAGNOSIS — R10811 Right upper quadrant abdominal tenderness: Secondary | ICD-10-CM | POA: Diagnosis not present

## 2023-10-31 DIAGNOSIS — R1011 Right upper quadrant pain: Secondary | ICD-10-CM | POA: Diagnosis not present

## 2023-10-31 DIAGNOSIS — R110 Nausea: Secondary | ICD-10-CM

## 2023-10-31 DIAGNOSIS — Z9049 Acquired absence of other specified parts of digestive tract: Secondary | ICD-10-CM | POA: Diagnosis not present

## 2023-10-31 DIAGNOSIS — K760 Fatty (change of) liver, not elsewhere classified: Secondary | ICD-10-CM | POA: Diagnosis not present

## 2023-10-31 LAB — LIPASE: Lipase: 18 U/L (ref 11.0–59.0)

## 2023-10-31 LAB — HEPATIC FUNCTION PANEL
ALT: 24 U/L (ref 0–35)
AST: 19 U/L (ref 0–37)
Albumin: 4.6 g/dL (ref 3.5–5.2)
Alkaline Phosphatase: 93 U/L (ref 39–117)
Bilirubin, Direct: 0.2 mg/dL (ref 0.0–0.3)
Total Bilirubin: 0.8 mg/dL (ref 0.2–1.2)
Total Protein: 7.5 g/dL (ref 6.0–8.3)

## 2023-10-31 LAB — AMYLASE: Amylase: 49 U/L (ref 27–131)

## 2023-10-31 MED ORDER — PROMETHAZINE HCL 25 MG PO TABS
12.5000 mg | ORAL_TABLET | Freq: Three times a day (TID) | ORAL | 0 refills | Status: AC | PRN
  Filled 2023-10-31: qty 30, 10d supply, fill #0

## 2023-10-31 MED ORDER — DICYCLOMINE HCL 20 MG PO TABS
20.0000 mg | ORAL_TABLET | Freq: Three times a day (TID) | ORAL | 0 refills | Status: AC
  Filled 2023-10-31: qty 90, 30d supply, fill #0

## 2023-10-31 NOTE — Telephone Encounter (Signed)
 Add her on to my schedule today - either double 130 or do 350  PJ FYI

## 2023-10-31 NOTE — Patient Instructions (Addendum)
 Your provider has requested that you go to the basement level for lab work before leaving today. Press B on the elevator. The lab is located at the first door on the left as you exit the elevator.  Due to recent changes in healthcare laws, you may see the results of your imaging and laboratory studies on MyChart before your provider has had a chance to review them.  We understand that in some cases there may be results that are confusing or concerning to you. Not all laboratory results come back in the same time frame and the provider may be waiting for multiple results in order to interpret others.  Please give us  48 hours in order for your provider to thoroughly review all the results before contacting the office for clarification of your results.   We have sent the following medications to your pharmacy for you to pick up at your convenience: Dicyclomine and phenergan   You have been scheduled for an abdominal ultrasound at [facility] on 11/04/2023 at 8:45AM. Please arrive 20 minutes prior to your appointment for registration. Make certain not to have anything to eat or drink 6 hours prior to your appointment. Should you need to reschedule your appointment, please contact radiology at [phone]. This test typically takes about 30 minutes to perform.  Your provider has ordered Diatherix stool testing for you. You have received a kit from our office today containing all necessary supplies to complete this test. Please carefully read the stool collection instructions provided in the kit before opening the accompanying materials. In addition, be sure there is a label providing your full name and date of birth on the puritan opti-swab tube that is supplied in the kit (if you do not see a label with this information on your test tube, please make us  aware before test collection!). After completing the test, you should secure the purtian tube into the specimen biohazard bag. The  [facility] E-Req  sheet (including date and time of specimen collection) should be placed into the outside pocket of the specimen biohazard bag and returned to the [facility] lab (basement floor of [facility]) within 3 days of collection. Please make sure to give the specimen to a staff member at the lab. DO NOT leave the specimen on the counter.   If the specimen date and time (can be found in the upper right boxed portion of the sheet) are not filled out on the E-Req sheet, the test will NOT be performed.    I appreciate the opportunity to care for you. Lupita Commander, MD, [facility]

## 2023-10-31 NOTE — Telephone Encounter (Signed)
 Patient tells me she is having daily nausea with vomiting at times despite Zofran . She has tried famotidine once which she felt may have helped a little. She is eating lightly and focusing on remaining hydrated. She tells me her stomach hurts regardless of eating or not. She is not feeling better.
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
## 2023-11-01 ENCOUNTER — Other Ambulatory Visit (HOSPITAL_COMMUNITY): Payer: Self-pay

## 2023-11-01 ENCOUNTER — Ambulatory Visit: Payer: Self-pay | Admitting: Internal Medicine

## 2023-11-04 ENCOUNTER — Ambulatory Visit
Admission: RE | Admit: 2023-11-04 | Discharge: 2023-11-04 | Disposition: A | Discharge status: Home / Self Care | Source: Ambulatory Visit | Attending: Internal Medicine | Admitting: Internal Medicine

## 2023-11-04 DIAGNOSIS — R634 Abnormal weight loss: Secondary | ICD-10-CM

## 2023-11-04 DIAGNOSIS — R112 Nausea with vomiting, unspecified: Secondary | ICD-10-CM | POA: Diagnosis not present

## 2023-11-04 DIAGNOSIS — R1011 Right upper quadrant pain: Secondary | ICD-10-CM | POA: Diagnosis not present

## 2023-11-04 DIAGNOSIS — R10811 Right upper quadrant abdominal tenderness: Secondary | ICD-10-CM

## 2023-11-04 DIAGNOSIS — R110 Nausea: Secondary | ICD-10-CM

## 2023-11-26 ENCOUNTER — Other Ambulatory Visit: Payer: Self-pay | Admitting: Internal Medicine

## 2023-11-26 ENCOUNTER — Other Ambulatory Visit (HOSPITAL_COMMUNITY): Payer: Self-pay

## 2023-11-26 MED ORDER — FLUTICASONE PROPIONATE 50 MCG/ACT NA SUSP
2.0000 | Freq: Every day | NASAL | 0 refills | Status: AC
  Filled 2023-11-26: qty 16, 30d supply, fill #0

## 2023-12-27 ENCOUNTER — Other Ambulatory Visit: Payer: Self-pay

## 2023-12-27 ENCOUNTER — Telehealth: Payer: Self-pay | Admitting: Internal Medicine

## 2023-12-27 DIAGNOSIS — E785 Hyperlipidemia, unspecified: Secondary | ICD-10-CM

## 2023-12-27 DIAGNOSIS — Z79899 Other long term (current) drug therapy: Secondary | ICD-10-CM

## 2023-12-27 DIAGNOSIS — I10 Essential (primary) hypertension: Secondary | ICD-10-CM

## 2023-12-27 DIAGNOSIS — R7303 Prediabetes: Secondary | ICD-10-CM

## 2023-12-27 NOTE — Telephone Encounter (Signed)
 Left message to call back.

## 2023-12-27 NOTE — Telephone Encounter (Signed)
 Pt would like to know if labs are not needed for this visit and if so could she come in next week please advise

## 2023-12-27 NOTE — Telephone Encounter (Signed)
 Patient returned RN's call.

## 2023-12-27 NOTE — Telephone Encounter (Signed)
 Spoke with patient and informed her labs have been ordered, she may complete them next week.  Also informed patient we have sent message to Dr. Okey to see if any additional labs were needed. Patient states we can send her a MyChart message letting her know if anything is added to labs or changed.

## 2023-12-30 ENCOUNTER — Other Ambulatory Visit: Payer: Self-pay | Admitting: Internal Medicine

## 2023-12-30 ENCOUNTER — Other Ambulatory Visit (HOSPITAL_COMMUNITY): Payer: Self-pay

## 2023-12-30 MED ORDER — ATENOLOL 100 MG PO TABS
100.0000 mg | ORAL_TABLET | Freq: Every day | ORAL | 0 refills | Status: AC
  Filled 2023-12-30: qty 90, 90d supply, fill #0

## 2023-12-31 ENCOUNTER — Other Ambulatory Visit (HOSPITAL_COMMUNITY): Payer: Self-pay

## 2024-01-03 ENCOUNTER — Other Ambulatory Visit (HOSPITAL_COMMUNITY): Payer: Self-pay

## 2024-01-03 LAB — HEMOGLOBIN A1C
Est. average glucose Bld gHb Est-mCnc: 120 mg/dL
Hgb A1c MFr Bld: 5.8 % — ABNORMAL HIGH (ref 4.8–5.6)

## 2024-01-04 LAB — NMR, LIPOPROFILE
Cholesterol, Total: 172 mg/dL (ref 100–199)
HDL Particle Number: 37.7 umol/L
HDL-C: 66 mg/dL
LDL Particle Number: 984 nmol/L
LDL Size: 21 nm
LDL-C (NIH Calc): 96 mg/dL (ref 0–99)
LP-IR Score: <25
Small LDL Particle Number: 336 nmol/L
Triglycerides: 51 mg/dL (ref 0–149)

## 2024-01-06 ENCOUNTER — Ambulatory Visit: Payer: Self-pay | Admitting: Internal Medicine

## 2024-01-06 DIAGNOSIS — Z79899 Other long term (current) drug therapy: Secondary | ICD-10-CM

## 2024-01-06 DIAGNOSIS — E785 Hyperlipidemia, unspecified: Secondary | ICD-10-CM

## 2024-01-07 ENCOUNTER — Other Ambulatory Visit (HOSPITAL_COMMUNITY): Payer: Self-pay

## 2024-01-07 MED ORDER — ATORVASTATIN CALCIUM 40 MG PO TABS
40.0000 mg | ORAL_TABLET | Freq: Every day | ORAL | 2 refills | Status: AC
  Filled 2024-01-07: qty 90, 90d supply, fill #0

## 2024-01-09 NOTE — Telephone Encounter (Signed)
 I was aware she was on 10  I did not think increasing to 20 would get her to goal   But, we can try    Follow up NMR panel in 8 to 12 weeks

## 2024-01-10 ENCOUNTER — Telehealth: Payer: Self-pay | Admitting: Internal Medicine

## 2024-01-10 NOTE — Telephone Encounter (Signed)
 Pt calling to make sure she is supposed to jump from 10MG  to 40. She is also worried about cramping. Please advise. Pt has not started medication yet.

## 2024-01-10 NOTE — Telephone Encounter (Signed)
 Spoke with patient verifying increase in Atorvastatin . Per Dr. Okey last note: I was aware she was on 10  I did not think increasing to 20 would get her to goal   But, we can try    Follow up NMR panel in 8 to 12 weeks  Pt agreed to start the 40 mg Atorvastatin  at this time. Was concerned with muscle cramping but willing to try. Appointment scheduled for 01/27/24. Pt advised if she has any side effects to let us  know.  Pt verbalizes understanding of plan.

## 2024-01-13 ENCOUNTER — Other Ambulatory Visit (HOSPITAL_COMMUNITY): Payer: Self-pay
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
## 2024-01-27 ENCOUNTER — Encounter: Payer: Self-pay | Admitting: Internal Medicine

## 2024-01-27 ENCOUNTER — Ambulatory Visit: Payer: Self-pay | Admitting: Internal Medicine

## 2024-01-27 VITALS — BP 124/74 | HR 90 | Ht 68.0 in | Wt 181.2 lb

## 2024-01-27 DIAGNOSIS — I10 Essential (primary) hypertension: Secondary | ICD-10-CM | POA: Diagnosis not present

## 2024-01-27 DIAGNOSIS — E782 Mixed hyperlipidemia: Secondary | ICD-10-CM

## 2024-01-27 DIAGNOSIS — E785 Hyperlipidemia, unspecified: Secondary | ICD-10-CM

## 2024-01-27 DIAGNOSIS — R000 Tachycardia, unspecified: Secondary | ICD-10-CM

## 2024-01-27 NOTE — Patient Instructions (Signed)
 Medication Instructions:  Your physician recommends that you continue on your current medications as directed. Please refer to the Current Medication list given to you today.  *If you need a refill on your cardiac medications before your next appointment, please call your pharmacy*  Lab Work: NONE If you have labs (blood work) drawn today and your tests are completely normal, you will receive your results only by: MyChart Message (if you have MyChart) OR A paper copy in the mail If you have any lab test that is abnormal or we need to change your treatment, we will call you to review the results.  Testing/Procedures: NONE  Follow-Up: At [facility], you and your health needs are our priority.  As part of our continuing mission to provide you with exceptional heart care, our providers are all part of one team.  This team includes your primary Cardiologist (physician) and Advanced Practice Providers or APPs (Physician Assistants and Nurse Practitioners) who all work together to provide you with the care you need, when you need it.  Your next appointment:   1 year(s)  Provider:   DR. OKEY  We recommend signing up for the patient portal called MyChart.  Sign up information is provided on this After Visit Summary.  MyChart is used to connect with patients for Virtual Visits (Telemedicine).  Patients are able to view lab/test results, encounter notes, upcoming appointments, etc.  Non-urgent messages can be sent to your provider as well.   To learn more about what you can do with MyChart, go to [email or web address].   Other Instructions

## 2024-02-13 ENCOUNTER — Other Ambulatory Visit (HOSPITAL_BASED_OUTPATIENT_CLINIC_OR_DEPARTMENT_OTHER)

## 2024-03-09 ENCOUNTER — Encounter: Admitting: Internal Medicine
